# Patient Record
Sex: Male | Born: 1945 | Hispanic: No | Marital: Married | Smoking: Never smoker
Health system: Southern US, Community
[De-identification: ages and names within clinical notes are randomized; demographics above are authoritative.]

## PROBLEM LIST (undated history)

## (undated) DIAGNOSIS — M719 Bursopathy, unspecified: Secondary | ICD-10-CM

## (undated) DIAGNOSIS — M1990 Unspecified osteoarthritis, unspecified site: Secondary | ICD-10-CM

## (undated) DIAGNOSIS — I10 Essential (primary) hypertension: Secondary | ICD-10-CM

## (undated) DIAGNOSIS — K56609 Unspecified intestinal obstruction, unspecified as to partial versus complete obstruction: Secondary | ICD-10-CM

## (undated) DIAGNOSIS — M419 Scoliosis, unspecified: Secondary | ICD-10-CM

## (undated) DIAGNOSIS — K219 Gastro-esophageal reflux disease without esophagitis: Secondary | ICD-10-CM

## (undated) DIAGNOSIS — M549 Dorsalgia, unspecified: Secondary | ICD-10-CM

## (undated) DIAGNOSIS — E119 Type 2 diabetes mellitus without complications: Secondary | ICD-10-CM

## (undated) HISTORY — PX: NO PAST SURGERIES: SHX2092

---

## 2007-06-20 ENCOUNTER — Emergency Department (HOSPITAL_COMMUNITY): Admission: EM | Admit: 2007-06-20 | Discharge: 2007-06-20 | Payer: Self-pay | Admitting: Emergency Medicine

## 2007-10-26 ENCOUNTER — Ambulatory Visit: Payer: Self-pay | Admitting: Nurse Practitioner

## 2007-10-26 DIAGNOSIS — M4120 Other idiopathic scoliosis, site unspecified: Secondary | ICD-10-CM | POA: Insufficient documentation

## 2007-10-26 DIAGNOSIS — M719 Bursopathy, unspecified: Secondary | ICD-10-CM

## 2007-10-26 DIAGNOSIS — M67919 Unspecified disorder of synovium and tendon, unspecified shoulder: Secondary | ICD-10-CM | POA: Insufficient documentation

## 2007-10-31 ENCOUNTER — Ambulatory Visit (HOSPITAL_COMMUNITY): Admission: RE | Admit: 2007-10-31 | Discharge: 2007-10-31 | Payer: Self-pay | Admitting: Nurse Practitioner

## 2007-11-28 ENCOUNTER — Ambulatory Visit: Payer: Self-pay | Admitting: Nurse Practitioner

## 2007-11-28 DIAGNOSIS — IMO0002 Reserved for concepts with insufficient information to code with codable children: Secondary | ICD-10-CM

## 2007-11-28 LAB — CONVERTED CEMR LAB
AST: 22 units/L (ref 0–37)
Albumin: 4.1 g/dL (ref 3.5–5.2)
Alkaline Phosphatase: 90 units/L (ref 39–117)
BUN: 8 mg/dL (ref 6–23)
Basophils Absolute: 0 10*3/uL (ref 0.0–0.1)
Basophils Relative: 0 % (ref 0–1)
Calcium: 9.6 mg/dL (ref 8.4–10.5)
Creatinine, Ser: 0.78 mg/dL (ref 0.40–1.50)
Eosinophils Absolute: 0.3 10*3/uL (ref 0.0–0.7)
Eosinophils Relative: 4 % (ref 0–5)
HCT: 38 % — ABNORMAL LOW (ref 39.0–52.0)
Hemoglobin: 13.6 g/dL (ref 13.0–17.0)
Lymphocytes Relative: 42 % (ref 12–46)
Lymphs Abs: 2.5 10*3/uL (ref 0.7–4.0)
Platelets: 314 10*3/uL (ref 150–400)
TSH: 0.65 microintl units/mL (ref 0.350–5.50)

## 2008-05-08 ENCOUNTER — Ambulatory Visit: Payer: Self-pay | Admitting: Nurse Practitioner

## 2008-05-08 DIAGNOSIS — K219 Gastro-esophageal reflux disease without esophagitis: Secondary | ICD-10-CM

## 2008-05-08 DIAGNOSIS — S6990XA Unspecified injury of unspecified wrist, hand and finger(s), initial encounter: Secondary | ICD-10-CM

## 2008-10-01 ENCOUNTER — Ambulatory Visit: Payer: Self-pay | Admitting: Nurse Practitioner

## 2008-10-01 DIAGNOSIS — F17200 Nicotine dependence, unspecified, uncomplicated: Secondary | ICD-10-CM | POA: Insufficient documentation

## 2008-12-21 ENCOUNTER — Inpatient Hospital Stay (HOSPITAL_COMMUNITY): Admission: EM | Admit: 2008-12-21 | Discharge: 2008-12-23 | Payer: Self-pay | Admitting: Emergency Medicine

## 2009-01-02 ENCOUNTER — Encounter: Admission: RE | Admit: 2009-01-02 | Discharge: 2009-01-02 | Payer: Self-pay | Admitting: Pulmonary Disease

## 2009-06-09 ENCOUNTER — Emergency Department (HOSPITAL_COMMUNITY): Admission: EM | Admit: 2009-06-09 | Discharge: 2009-06-09 | Payer: Self-pay | Admitting: Emergency Medicine

## 2009-06-16 ENCOUNTER — Ambulatory Visit: Payer: Self-pay | Admitting: Nurse Practitioner

## 2010-05-31 ENCOUNTER — Emergency Department (HOSPITAL_COMMUNITY): Admission: EM | Admit: 2010-05-31 | Discharge: 2010-05-31 | Payer: Self-pay | Admitting: Family Medicine

## 2010-07-26 ENCOUNTER — Ambulatory Visit: Payer: Self-pay | Admitting: Nurse Practitioner

## 2010-07-26 DIAGNOSIS — M549 Dorsalgia, unspecified: Secondary | ICD-10-CM | POA: Insufficient documentation

## 2010-07-26 LAB — CONVERTED CEMR LAB
ALT: 21 units/L (ref 0–53)
AST: 32 units/L (ref 0–37)
Blood in Urine, dipstick: NEGATIVE
Calcium: 9.5 mg/dL (ref 8.4–10.5)
Glucose, Bld: 86 mg/dL (ref 70–99)
Ketones, urine, test strip: NEGATIVE
Nitrite: NEGATIVE
Specific Gravity, Urine: 1.02
WBC Urine, dipstick: NEGATIVE

## 2010-09-17 ENCOUNTER — Ambulatory Visit
Admission: RE | Admit: 2010-09-17 | Discharge: 2010-09-17 | Payer: Self-pay | Source: Home / Self Care | Attending: Nurse Practitioner | Admitting: Nurse Practitioner

## 2010-09-17 ENCOUNTER — Encounter (INDEPENDENT_AMBULATORY_CARE_PROVIDER_SITE_OTHER): Payer: Self-pay | Admitting: Nurse Practitioner

## 2010-09-28 ENCOUNTER — Telehealth (INDEPENDENT_AMBULATORY_CARE_PROVIDER_SITE_OTHER): Payer: Self-pay | Admitting: Nurse Practitioner

## 2010-10-07 ENCOUNTER — Encounter (INDEPENDENT_AMBULATORY_CARE_PROVIDER_SITE_OTHER): Payer: Self-pay | Admitting: Nurse Practitioner

## 2010-10-07 ENCOUNTER — Encounter (INDEPENDENT_AMBULATORY_CARE_PROVIDER_SITE_OTHER): Payer: Self-pay | Admitting: *Deleted

## 2010-10-07 ENCOUNTER — Ambulatory Visit
Admission: RE | Admit: 2010-10-07 | Discharge: 2010-10-07 | Payer: Self-pay | Source: Home / Self Care | Attending: Nurse Practitioner | Admitting: Nurse Practitioner

## 2010-10-07 ENCOUNTER — Telehealth (INDEPENDENT_AMBULATORY_CARE_PROVIDER_SITE_OTHER): Payer: Self-pay | Admitting: Nurse Practitioner

## 2010-10-07 DIAGNOSIS — R030 Elevated blood-pressure reading, without diagnosis of hypertension: Secondary | ICD-10-CM | POA: Insufficient documentation

## 2010-10-07 DIAGNOSIS — M25519 Pain in unspecified shoulder: Secondary | ICD-10-CM | POA: Insufficient documentation

## 2010-10-07 LAB — CONVERTED CEMR LAB
Bilirubin Urine: NEGATIVE
Blood in Urine, dipstick: NEGATIVE
Glucose, Urine, Semiquant: NEGATIVE
Protein, U semiquant: NEGATIVE
Urobilinogen, UA: 0.2
WBC Urine, dipstick: NEGATIVE

## 2010-10-08 ENCOUNTER — Encounter (INDEPENDENT_AMBULATORY_CARE_PROVIDER_SITE_OTHER): Payer: Self-pay | Admitting: Nurse Practitioner

## 2010-10-12 NOTE — Assessment & Plan Note (Signed)
Summary: Back pain   Vital Signs:  Patient profile:   65 year old male Height:      62. inches Weight:      150.00 pounds BMI:     27.53 Temp:     97.3 degrees F oral Pulse rate:   80 / minute Pulse rhythm:   regular Resp:     20 per minute BP sitting:   110 / 80  (left arm) Cuff size:   regular  Vitals Entered By: Levon Hedger (July 26, 2010 10:27 AM)  Nutrition Counseling: Patient's BMI is greater than 25 and therefore counseled on weight management options. CC: pain in back x 2-3 days, Back Pain Is Patient Diabetic? No Pain Assessment Patient in pain? yes     Location: back Intensity: 9  Does patient need assistance? Functional Status Self care Ambulation Normal   CC:  pain in back x 2-3 days and Back Pain.  History of Present Illness:  Pt into the office with c/o back pain. Started 2 days ago When bending or changes positions then the pain is worse sitting pain is ok denies any heavy lifing Pt is not employed no previous back pain        This is a 65 year old man who presents with Back Pain.  The symptoms began 3 days ago.  The intensity is described as an 8 out of 10.  The patient denies fever, weakness, urinary incontinence, and dysuria.  The pain is located in the right low back and left low back.  The pain began at home.  The pain is made worse by activity.  The pain is made better by inactivity.    Flu vaccine given at pt's school  Son present today to interpret for pt - Nepali  Habits & Providers  Alcohol-Tobacco-Diet     Alcohol drinks/day: 0     Tobacco Status: current     Tobacco Counseling: to remain off tobacco products     Cigarette Packs/Day: chewing tobacco  Exercise-Depression-Behavior     Does Patient Exercise: yes     Exercise Counseling: not indicated; exercise is adequate     Exercise (avg: min/session): <30     Times/week: 3     Drug Use: no     Sun Exposure: occasionally  Allergies (verified): No Known Drug  Allergies  Social History: Packs/Day:  chewing tobacco  Review of Systems CV:  Denies chest pain or discomfort. Resp:  Denies cough. GI:  Denies abdominal pain, nausea, and vomiting. MS:  Complains of low back pain.  Physical Exam  General:  alert.   Head:  normocephalic.   Mouth:  poor dentition.   Lungs:  normal breath sounds.   Heart:  normal rate and regular rhythm.   Msk:  normal ROM.   scoliosis - thoracic Neurologic:  alert & oriented X3.   Skin:  color normal.   Psych:  Oriented X3.    Immunization History:  Hepatitis B Immunization History:    Hepatitis B # 1:  historical (11/13/2006)  MMR Immunization History:    MMR # 1:  historical (11/13/2006)  Tetanus/Td Immunization History:    Tetanus/Td:  historical (02/26/2008)  Zostavax History:    Zostavax # 1:  zostavax (state) (02/26/2008)   Detailed Back/Spine Exam  Thoracic Exam:  Inspection-deformity:    Abnormal    scoliosis  Lumbosacral Exam:  Inspection-deformity:    Abnormal Palpation-spinal tenderness:  Abnormal    Location:  L4-L5 Sitting Straight Leg Raise:  Right:  negative    Left:  negative   Impression & Recommendations:  Problem # 1:  BACK PAIN (ICD-724.5) most likely strain although pt does have scoliosis Orders: T-Comprehensive Metabolic Panel (04540-98119) UA Dipstick w/o Micro (manual) (14782)  His updated medication list for this problem includes:    Ibuprofen 800 Mg Tabs (Ibuprofen) ..... One tablet by mouth two times a day as needed for pain  Problem # 2:  NEED PROPHYLACTIC VACCINATION&INOCULATION FLU (ICD-V04.81) given today in office  Problem # 3:  TOBACCO ABUSE (ICD-305.1) advised cessation  Complete Medication List: 1)  Ibuprofen 800 Mg Tabs (Ibuprofen) .... One tablet by mouth two times a day as needed for pain  Other Orders: Pneumococcal Vaccine (95621) Admin 1st Vaccine (30865)  Patient Instructions: 1)  Schedule an appointment for a complete physical  exam 2)  No food after midnight before this visit. 3)  will need fasting labs, EKG. u/a, rectal/prostate. 4)  Back pain - you have a history of scoliosis which is an abnormality in the middle part of your back.  This is nothing now. 5)  Low back pain may be due to strain on low back muscles. 6)  Take ibuprofen 800mg  by mouth two times a day as needed for pain 7)  You have received the pneumovax today. Prescriptions: IBUPROFEN 800 MG TABS (IBUPROFEN) One tablet by mouth two times a day as needed for pain  #50 x 0   Entered and Authorized by:   Lehman Prom FNP   Signed by:   Lehman Prom FNP on 07/26/2010   Method used:   Print then Give to Patient   RxID:   [id number]    Orders Added: 1)  Est. Patient Level III [40102] 2)  T-Comprehensive Metabolic Panel [72536-64403] 3)  UA Dipstick w/o Micro (manual) [81002] 4)  Pneumococcal Vaccine [90732] 5)  Admin 1st Vaccine [47425]   Immunization History:  Hepatitis B Immunization History:    Hepatitis B # 1:  historical (11/13/2006)  MMR Immunization History:    MMR # 1:  historical (11/13/2006)  Tetanus/Td Immunization History:    Tetanus/Td:  historical (02/26/2008)  Zostavax History:    Zostavax # 1:  zostavax (state) (02/26/2008)  Immunizations Administered:  Pneumonia Vaccine:    Vaccine Type: Pneumovax    Site: left deltoid    Mfr: Merck    Dose: 0.5 ml    Route: IM    Given by: [place] Shannon CMA    Exp. Date: 11/30/2011    Lot #: [id number]    VIS given: 08/17/09 version given July 26, 2010.   Immunization History:  Hepatitis B Immunization History:    Hepatitis B # 1:  Historical (11/13/2006)  MMR Immunization History:    MMR # 1:  Historical (11/13/2006)  Tetanus/Td Immunization History:    Tetanus/Td:  Historical (02/26/2008)  Zostavax History:    Zostavax # 1:  Zostavax (State) (02/26/2008)  Immunizations Administered:  Pneumonia Vaccine:    Vaccine Type: Pneumovax    Site: left  deltoid    Mfr: Merck    Dose: 0.5 ml    Route: IM    Given by: [place] Shannon CMA    Exp. Date: 11/30/2011    Lot #: [id number]    VIS given: 08/17/09 version given July 26, 2010.  Prevention & Chronic Care Immunizations   Influenza vaccine: Not documented   Influenza vaccine deferral: Not indicated  (07/26/2010)    Tetanus booster: 02/26/2008: Historical    Pneumococcal vaccine: Pneumovax  (  07/26/2010)    H. zoster vaccine: 02/26/2008: Zostavax (State)  Colorectal Screening   Hemoccult: Not documented    Colonoscopy: Not documented  Other Screening   PSA: Not documented   Smoking status: current  (07/26/2010)  Lipids   Total Cholesterol: Not documented   LDL: Not documented   LDL Direct: Not documented   HDL: Not documented   Triglycerides: Not documented   Laboratory Results   Urine Tests  Date/Time Received: July 26, 2010 11:29 AM   Routine Urinalysis   Color: lt. yellow Glucose: negative   (Normal Range: Negative) Bilirubin: negative   (Normal Range: Negative) Ketone: negative   (Normal Range: Negative) Spec. Gravity: 1.020   (Normal Range: 1.003-1.035) Blood: negative   (Normal Range: Negative) pH: 5.5   (Normal Range: 5.0-8.0) Protein: negative   (Normal Range: Negative) Urobilinogen: 0.2   (Normal Range: 0-1) Nitrite: negative   (Normal Range: Negative) Leukocyte Esterace: negative   (Normal Range: Negative)

## 2010-10-14 LAB — CONVERTED CEMR LAB
Basophils Absolute: 0 10*3/uL (ref 0.0–0.1)
Eosinophils Relative: 6 % — ABNORMAL HIGH (ref 0–5)
HCT: 38.9 % — ABNORMAL LOW (ref 39.0–52.0)
Hemoglobin: 13.8 g/dL (ref 13.0–17.0)
Monocytes Absolute: 0.3 10*3/uL (ref 0.1–1.0)
Monocytes Relative: 7 % (ref 3–12)
Neutro Abs: 1.9 10*3/uL (ref 1.7–7.7)
Neutrophils Relative %: 40 % — ABNORMAL LOW (ref 43–77)
Platelets: 247 10*3/uL (ref 150–400)
RDW: 14.9 % (ref 11.5–15.5)
Total CHOL/HDL Ratio: 4.6
Triglycerides: 105 mg/dL (ref <150)
WBC: 4.9 10*3/uL (ref 4.0–10.5)

## 2010-10-14 NOTE — Letter (Signed)
Summary: Pre Visit Letter Revised  [facility]  [address] [place], [place] [address]   Phone: [phone]  Fax: [phone]        10/07/2010 MRN: [id number] [facility] [address] [place], [place]  [address]             Procedure Date:  11-10-10   Welcome to the Gastroenterology Division at [facility].    You are scheduled to see a nurse for your pre-procedure visit on 10-27-10 at 10:30A.M. on the 3rd floor at [facility], [address]  We ask that you try to arrive at our office 15 minutes prior to your appointment time to allow for check-in.  Please take a minute to review the attached form.  If you answer "Yes" to one or more of the questions on the first page, we ask that you call the person listed at your earliest opportunity.  If you answer "No" to all of the questions, please complete the rest of the form and bring it to your appointment.    Your nurse visit will consist of discussing your medical and surgical history, your immediate family medical history, and your medications.   If you are unable to list all of your medications on the form, please bring the medication bottles to your appointment and we will list them.  We will need to be aware of both prescribed and over the counter drugs.  We will need to know exact dosage information as well.    Please be prepared to read and sign documents such as consent forms, a financial agreement, and acknowledgement forms.  If necessary, and with your consent, a friend or relative is welcome to sit-in on the nurse visit with you.  Please bring your insurance card so that we may make a copy of it.  If your insurance requires a referral to see a specialist, please bring your referral form from your primary care physician.  No co-pay is required for this nurse visit.     If you cannot keep your appointment, please call [phone] to cancel or reschedule prior to your appointment date.  This  allows [place] the opportunity to schedule an appointment for another patient in need of care.    Thank you for choosing  Gastroenterology for your medical needs.  We appreciate the opportunity to care for you.  Please visit [place] at our website  to learn more about our practice.  Sincerely, The Gastroenterology Division

## 2010-10-14 NOTE — Progress Notes (Signed)
Summary: Colonscopy  Phone Note Outgoing Call   Summary of Call: refer pt to GI for colonscopy (screening) never had one before no family history of colon cancer Call Son - Abil regarding the colonscopy appt (pt does not speak [place]) Jeanett Schlein (son) [phone] Initial call taken by: Lehman Prom FNP,  October 07, 2010 9:41 AM  Follow-up for Phone Call        PT HAS AN APPT NURSE VISIT 10-27-10 @ 10:30AM  PROCEDURE  11-10-10@ 8:30AM   [facility] SON   ABIL AWARE OF HIS APPT .Marland KitchenCheryll Dessert  October 07, 2010 10:24 AM

## 2010-10-14 NOTE — Assessment & Plan Note (Signed)
Summary: Chest pain  Nurse Visit   Vital Signs:  Patient profile:   65 year old male Temp:     96.8 degrees F Pulse rate:   72 / minute Pulse rhythm:   regular Resp:     16 per minute BP sitting:   96 / 68  (right arm) Cuff size:   regular  Vitals Entered By: Dutch Quint RN (September 17, 2010 11:14 AM)  CC:  c/o CP.  History of Present Illness: States CP began 5 days ago -- has never had before.  Denies dyspnea.   Review of Systems CV:  Complains of chest pain or discomfort; States pain is substernal/epigastric, nonradiating, burning.  Pain is constant, but intensity changes from slight to severe.  Pain is worse in the morning.Marland Kitchen GI:  Epigastric pain.  Stopped use of ibuprofen about a month ago, does use caffeine, eats spicy food.  Denies ETOH and tobacco use.Marland Kitchen    Physical Exam  General:  alert, well-developed, well-nourished, and well-hydrated.    CC: c/o CP Is Patient Diabetic? No Pain Assessment Patient in pain? yes     Location: chest Type: burning Onset of pain  5 days ago  Does patient need assistance? Functional Status Self care Ambulation Normal   Patient Instructions: 1)  Reviewed with Jesse Fall 2)  Your EKG is normal -- your chest pain is not related to your heart. 3)  Limit your intake of spicy foods and caffeine drinks, such as tea, especially while you are having this pain. 4)  Take Nexium 40 mg. one tab by mouth before breakfast every morning.   5)  Do not eat right before lying down.  Keep your head up a little in bed, try not to lie flat. 6)  Call if anything changes or if you have any questions.   Impression & Recommendations:  Problem # 1:  GERD (ICD-530.81)  Having CP - nonradiating EKG normal Advised re diet changes  to prevent reoccurrence To start Nexium F/U with provider  His updated medication list for this problem includes:    Nexium 40 Mg Cpdr (Esomeprazole magnesium) .Marland Kitchen... 1 tab by mouth before breakfast  daily  Orders: EKG w/ Interpretation (93000)  Complete Medication List: 1)  Ibuprofen 800 Mg Tabs (Ibuprofen) .... One tablet by mouth two times a day as needed for pain 2)  Nexium 40 Mg Cpdr (Esomeprazole magnesium) .Marland Kitchen.. 1 tab by mouth before breakfast daily   Allergies: No Known Drug Allergies  Orders Added: 1)  Est. Patient Level II [[address]] 2)  EKG w/ Interpretation [93000] Prescriptions: NEXIUM 40 MG CPDR (ESOMEPRAZOLE MAGNESIUM) 1 tab by mouth before breakfast daily  #30 x 3   Entered by:   Dutch Quint RN   Authorized by:   Lehman Prom FNP   Signed by:   Dutch Quint RN on 09/17/2010   Method used:   Print then Give to Patient   RxID:   [id number] NEXIUM 40 MG CPDR (ESOMEPRAZOLE MAGNESIUM) 1 tab by mouth before breakfast daily  #30 x 3   Entered by:   Dutch Quint RN   Authorized by:   Lehman Prom FNP   Signed by:   Dutch Quint RN on 09/17/2010   Method used:   Electronically to        [facility] [phone]* (retail)       [address]       [place], [place]  [address]       Ph:  [phone]       Fax: [phone]   RxID:   [id number]    EKG  Procedure date:  09/17/2010  Findings:      NSR

## 2010-10-14 NOTE — Letter (Signed)
Summary: Lipid Letter  [facility]  [address] [place], [place] [address]   Phone: [phone]  Fax: [phone]    10/08/2010  Francisco [name] [address] [place], [place]  [address]  Dear Francisco [name]:  We have carefully reviewed your last lipid profile from 10/07/2010 and the results are noted below with a summary of recommendations for lipid management.    Cholesterol:       179     Goal: less than 200   HDL "good" Cholesterol:   39     Goal: greater than 40   LDL "bad" Cholesterol:   119     Goal: less than 130   Triglycerides:       105     Goal: less than 150    Labs done during recent office visit are ok.  Your cholesterol labs are shown above.      Current Medications: 1)    Ibuprofen 800 Mg Tabs (Ibuprofen) .... One tablet by mouth two times a day as needed for pain 2)    Nexium 40 Mg Cpdr (Esomeprazole magnesium) .Marland Kitchen.. 1 tab by mouth before breakfast daily 3)    Patanol 0.1 % Soln (Olopatadine hcl) .... One drop in each eye two times a day  If you have any questions, please call. We appreciate being able to work with you.   Sincerely,    [facility] Francisco Prom FNP

## 2010-10-14 NOTE — Assessment & Plan Note (Signed)
Summary: Complete Physical Exam   Vital Signs:  Patient profile:   65 year old male Weight:      157.4 pounds Temp:     97.0 degrees F oral Pulse rate:   70 / minute Pulse rhythm:   regular Resp:     16 per minute BP sitting:   128 / 100  (left arm) Cuff size:   regular  Vitals Entered By: Levon Hedger (October 07, 2010 9:02 AM) CC: CPE...has been having eye irritation and jumping sensation., Abdominal Pain Is Patient Diabetic? No Pain Assessment Patient in pain? no       Does patient need assistance? Functional Status Self care Ambulation Normal   CC:  CPE...has been having eye irritation and jumping sensation. and Abdominal Pain.  History of Present Illness:  Pt into the office for a complete physical exam  Son present today to interpret  Optho - no recent eye exam.  He does complain of his eye jumping at times. Also he wants to scratch his eye as they are itchy from time to time.  Dental - no recent dental exam. No lower teeth.  Colonscopy - no history of colonscopy. No reports of constipation  no blood noted in stool  Dyspepsia History:      He has no alarm features of dyspepsia including no history of melena, hematochezia, dysphagia, persistent vomiting, or involuntary weight loss > 5%.  There is a prior history of GERD.  The patient does not have a prior history of documented ulcer disease.  The dominant symptom is not heartburn or acid reflux.  An H-2 blocker medication is not currently being taken.    Habits & Providers  Alcohol-Tobacco-Diet     Alcohol drinks/day: 0     Tobacco Status: current     Tobacco Counseling: to remain off tobacco products     Cigarette Packs/Day: chewing tobacco  Exercise-Depression-Behavior     Does Patient Exercise: yes     Exercise Counseling: not indicated; exercise is adequate     Exercise (avg: min/session): <30     Times/week: 3     Have you felt down or hopeless? no     Have you felt little pleasure in things?  no     Drug Use: no     Sun Exposure: occasionally  Current Medications (verified): 1)  Ibuprofen 800 Mg Tabs (Ibuprofen) .... One Tablet By Mouth Two Times A Day As Needed For Pain 2)  Nexium 40 Mg Cpdr (Esomeprazole Magnesium) .Marland Kitchen.. 1 Tab By Mouth Before Breakfast Daily  Allergies (verified): No Known Drug Allergies  Review of Systems General:  Denies fever. Eyes:  Complains of blurring. ENT:  Denies earache. CV:  Denies chest pain or discomfort. Resp:  Denies cough. GI:  Denies abdominal pain and indigestion; GERD symptoms improved and pt is no longer taking the nexium. GU:  Denies discharge, nocturia, and urinary frequency. MS:  Complains of joint pain; left shoulder pain at times. . Derm:  Denies dryness. Neuro:  Denies headaches. Psych:  Denies depression. Allergy:  Complains of itching eyes.  Physical Exam  General:  alert.   Head:  normocephalic.   Eyes:  pupils round.   Ears:  left TM - scarred TM right TM visible with no landmarks present Nose:  no nasal discharge.   Mouth:  no lower teeth upper teeth present with poor dentation Neck:  supple.   Chest Wall:  no masses.   Breasts:  no gynecomastia.   Lungs:  normal breath sounds.   Heart:  normal rate and regular rhythm.   Abdomen:  soft, non-tender, and normal bowel sounds.   Rectal:  no external abnormalities.   Genitalia:  uncircumcised and no scrotal masses.   Prostate:  no gland enlargement.   Pulses:  R radial normal and L radial normal.   Extremities:  no edema Neurologic:  alert & oriented X3.   Skin:  turgor normal.   Psych:  Oriented X3.     Shoulder/Elbow Exam  Shoulder Exam:    Left:    Inspection:  Normal    Palpation:  Abnormal       Location:  left AC joint    Stability:  stable    Tenderness:  no    Swelling:  no    Erythema:  no   Impression & Recommendations:  Problem # 1:  Preventive Health Care (ICD-V70.0) labs done rec optho and dental exam EKG done rectal and prostate  exam is normal  Problem # 2:  Screening PSA (ICD-V76.44) will check today  Problem # 3:  SCREENING, COLON CANCER (ICD-V76.51) will refer to GI Orders: Colonoscopy (Colon)  Problem # 4:  SPECIAL SCREENING MALIGNANT NEOPLASM OF PROSTATE (ICD-V76.44) prostate exam normal today will check PSA Orders: T-PSA (0011001100) Hemoccult Guaiac-1 spec.(in office) (82270)  Problem # 5:  TOBACCO ABUSE (ICD-305.1) advised cessation  Problem # 6:  BACK PAIN (ICD-724.5)  His updated medication list for this problem includes:    Ibuprofen 800 Mg Tabs (Ibuprofen) ..... One tablet by mouth two times a day as needed for pain  Orders: T-PSA (60109-32355) UA Dipstick w/o Micro (manual) (73220)  Problem # 7:  SHOULDER PAIN, LEFT (ICD-719.41) advised conservative management if still persists will do further workup His updated medication list for this problem includes:    Ibuprofen 800 Mg Tabs (Ibuprofen) ..... One tablet by mouth two times a day as needed for pain  Complete Medication List: 1)  Ibuprofen 800 Mg Tabs (Ibuprofen) .... One tablet by mouth two times a day as needed for pain 2)  Nexium 40 Mg Cpdr (Esomeprazole magnesium) .Marland Kitchen.. 1 tab by mouth before breakfast daily 3)  Patanol 0.1 % Soln (Olopatadine hcl) .... One drop in each eye two times a day  Other Orders: T-Lipid Profile (25427-06237) T-CBC w/Diff (62831-51761) T-TSH [phone]) EKG w/ Interpretation (93000)   Patient Instructions: 1)  Eyes - You should make an appointment with an eye doctor to get your eyes checked.  Start eye drops - one drop in each eye two times a day  2)  Colonscopy - You will be referred for the colonscopy screening test by the stomach doctors. You will be called with the date/time of the appointment 3)  Left shoulder - most likely due to arthritis.  Apply heat to the shoulder as needed for relief.  Try not to lay on this side at night while you are sleeping. 4)  Follow up as  needed Prescriptions: PATANOL 0.1 % SOLN (OLOPATADINE HCL) One drop in each eye two times a day  #56ml x 1   Entered and Authorized by:   Lehman Prom FNP   Signed by:   Lehman Prom FNP on 10/07/2010   Method used:   Print then Give to Patient   RxID:   [id number]    Orders Added: 1)  T-Lipid Profile [18299-37169] 2)  T-CBC w/Diff [67893-81017] 3)  T-PSA [51025-85277] 4)  T-TSH [82423-53614] 5)  Est. Patient age 61&> [[address]] 6)  UA  Dipstick w/o Micro (manual) [81002] 7)  Hemoccult Guaiac-1 spec.(in office) [82270] 8)  EKG w/ Interpretation [93000] 9)  Colonoscopy [Colon]    Laboratory Results   Urine Tests  Date/Time Received: October 07, 2010 9:16 AM   Routine Urinalysis   Color: lt. yellow Appearance: Clear Glucose: negative   (Normal Range: Negative) Bilirubin: negative   (Normal Range: Negative) Ketone: negative   (Normal Range: Negative) Spec. Gravity: <1.005   (Normal Range: 1.003-1.035) Blood: negative   (Normal Range: Negative) pH: 7.5   (Normal Range: 5.0-8.0) Protein: negative   (Normal Range: Negative) Urobilinogen: 0.2   (Normal Range: 0-1) Nitrite: negative   (Normal Range: Negative) Leukocyte Esterace: negative   (Normal Range: Negative)    Date/Time Received: October 07, 2010 10:01 AM   Stool - Occult Blood Hemmoccult #1: negative Date: 10/07/2010     EKG  Procedure date:  10/07/2010  Findings:      NSR   Laboratory Results   Urine Tests    Routine Urinalysis   Color: lt. yellow Appearance: Clear Glucose: negative   (Normal Range: Negative) Bilirubin: negative   (Normal Range: Negative) Ketone: negative   (Normal Range: Negative) Spec. Gravity: <1.005   (Normal Range: 1.003-1.035) Blood: negative   (Normal Range: Negative) pH: 7.5   (Normal Range: 5.0-8.0) Protein: negative   (Normal Range: Negative) Urobilinogen: 0.2   (Normal Range: 0-1) Nitrite: negative   (Normal Range: Negative) Leukocyte Esterace:  negative   (Normal Range: Negative)      Stool - Occult Blood Hemmoccult #1: negative    Prevention & Chronic Care Immunizations   Influenza vaccine: historical per pt  (07/13/2010)   Influenza vaccine deferral: Not indicated  (07/26/2010)    Tetanus booster: 02/26/2008: Historical    Pneumococcal vaccine: Pneumovax  (07/26/2010)    H. zoster vaccine: 02/26/2008: Zostavax ([place])  Colorectal Screening   Hemoccult: Not documented    Colonoscopy: Not documented  Other Screening   PSA: Not documented   PSA ordered.   Smoking status: current  (10/07/2010)   Smoking cessation counseling: yes  (10/07/2010)  Lipids   Total Cholesterol: Not documented   LDL: Not documented   LDL Direct: Not documented   HDL: Not documented   Triglycerides: Not documented

## 2010-10-14 NOTE — Progress Notes (Signed)
Summary: Requesting ibuprofen  Phone Note Outgoing Call   Summary of Call: Pt. is still requesting ibuprofen.  Provider refuses refills due to GERD symptoms and advised use of tylenol instead.  Daughter-in-law advised during last phone call, call made to pt. to remind of provider's last response.   Left message with male for DIL or son to return call when they come home from work.   Initial call taken by: Dutch Quint RN,  September 28, 2010 12:39 PM  Follow-up for Phone Call        Spoke with DIL - states pt. is still taking Nexium.  Advised for pt. to take tylenol for pain, not ibuprofen because it upsets his stomach and informed that we will not refill his ibuprofen for that reason.  Verbalized understanding and agreement.  Dutch Quint RN  September 28, 2010 5:55 PM

## 2010-10-25 ENCOUNTER — Encounter (INDEPENDENT_AMBULATORY_CARE_PROVIDER_SITE_OTHER): Payer: Self-pay | Admitting: *Deleted

## 2010-10-27 ENCOUNTER — Encounter: Payer: Self-pay | Admitting: Gastroenterology

## 2010-11-03 NOTE — Letter (Signed)
Summary: [facility] Instructions  [facility]  [address] [place], [place] [address]   Phone: [phone]  Fax: [phone]       Francisco [name]    [birth year]    MRN: [id number]        Procedure Day Dorna Bloom:  [facility]  11/10/10     Arrival Time:  8:30AM     Procedure Time:  9:30AM     Location of Procedure:                    [name]  [facility] (4th Floor)                      PREPARATION FOR COLONOSCOPY WITH MOVIPREP   Starting 5 days prior to your procedure 11/05/10 do not eat nuts, seeds, popcorn, corn, beans, peas,  salads, or any raw vegetables.  Do not take any fiber supplements (e.g. Metamucil, Citrucel, and Benefiber).  THE DAY BEFORE YOUR PROCEDURE         DATE: 11/09/10   DAY: TUESDAY  1.  Drink clear liquids the entire day-NO SOLID FOOD  2.  Do not drink anything colored red or purple.  Avoid juices with pulp.  No orange juice.  3.  Drink at least 64 oz. (8 glasses) of fluid/clear liquids during the day to prevent dehydration and help the prep work efficiently.  CLEAR LIQUIDS INCLUDE: Water Jello Ice Popsicles Tea (sugar ok, no milk/cream) Powdered fruit flavored drinks Coffee (sugar ok, no milk/cream) Gatorade Juice: apple, white grape, white cranberry  Lemonade Clear bullion, consomm, broth Carbonated beverages (any kind) Strained chicken noodle soup Hard Candy                             4.  In the morning, mix first dose of MoviPrep solution:    Empty 1 Pouch A and 1 Pouch B into the disposable container    Add lukewarm drinking water to the top line of the container. Mix to dissolve    Refrigerate (mixed solution should be used within 24 hrs)  5.  Begin drinking the prep at 5:00 p.m. The MoviPrep container is divided by 4 marks.   Every 15 minutes drink the solution down to the next mark (approximately 8 oz) until the full liter is complete.   6.  Follow completed prep with 16 oz of clear liquid of your choice (Nothing  red or purple).  Continue to drink clear liquids until bedtime.  7.  Before going to bed, mix second dose of MoviPrep solution:    Empty 1 Pouch A and 1 Pouch B into the disposable container    Add lukewarm drinking water to the top line of the container. Mix to dissolve    Refrigerate  THE DAY OF YOUR PROCEDURE      DATE: 11/10/10   DAY:  WEDNESDAY  Beginning at 4:30AM (5 hours before procedure):         1. Every 15 minutes, drink the solution down to the next mark (approx 8 oz) until the full liter is complete.  2. Follow completed prep with 16 oz. of clear liquid of your choice.    3. You may drink clear liquids until 7:30AM (2 HOURS BEFORE PROCEDURE).   MEDICATION INSTRUCTIONS  Unless otherwise instructed, you should take regular prescription medications with a small sip of water   as early as possible the  morning of your procedure.         OTHER INSTRUCTIONS  You will need a responsible adult at least 66 years of age to accompany you and drive you home.   This person must remain in the waiting room during your procedure.  Wear loose fitting clothing that is easily removed.  Leave jewelry and other valuables at home.  However, you may wish to bring a book to read or  an iPod/MP3 player to listen to music as you wait for your procedure to start.  Remove all body piercing jewelry and leave at home.  Total time from sign-in until discharge is approximately 2-3 hours.  You should go home directly after your procedure and rest.  You can resume normal activities the  day after your procedure.  The day of your procedure you should not:   Drive   Make legal decisions   Operate machinery   Drink alcohol   Return to work  You will receive specific instructions about eating, activities and medications before you leave.    The above instructions have been reviewed and explained to me by   Francisco Almas RN  October 27, 2010 10:42 AM     I fully understand  and can verbalize these instructions _____________________________ Date _________

## 2010-11-03 NOTE — Miscellaneous (Signed)
Summary: [facility] Previsit/prep  Clinical Lists Changes  Medications: Added new medication of MOVIPREP 100 GM  SOLR (PEG-KCL-NACL-NASULF-NA ASC-C) As per prep instructions. - Signed Rx of MOVIPREP 100 GM  SOLR (PEG-KCL-NACL-NASULF-NA ASC-C) As per prep instructions.;  #1 x 0;  Signed;  Entered by: Wyona Almas RN;  Authorized by: Louis Meckel MD;  Method used: Electronically to [facility] [phone]*, [address] [place], [place]  [address], Ph: [phone], Fax: [phone] Observations: Added new observation of NKA: T (10/27/2010 10:00)    Prescriptions: MOVIPREP 100 GM  SOLR (PEG-KCL-NACL-NASULF-NA ASC-C) As per prep instructions.  #1 x 0   Entered by:   Wyona Almas RN   Authorized by:   Louis Meckel MD   Signed by:   Wyona Almas RN on 10/27/2010   Method used:   Electronically to        [facility] [phone]* (retail)       [address]       [place], [place]  [address]       Ph: [phone]       Fax: [phone]   RxID:   [id number]

## 2010-11-10 ENCOUNTER — Other Ambulatory Visit (AMBULATORY_SURGERY_CENTER): Payer: Medicaid Other | Admitting: Gastroenterology

## 2010-11-10 ENCOUNTER — Encounter: Payer: Self-pay | Admitting: Gastroenterology

## 2010-11-10 DIAGNOSIS — Z1211 Encounter for screening for malignant neoplasm of colon: Secondary | ICD-10-CM

## 2010-11-18 NOTE — Procedures (Signed)
Summary: Colonoscopy  Patient: Francisco [name] Note: All result statuses are Final unless otherwise noted.  Tests: (1) Colonoscopy (COL)   COL Colonoscopy           DONE     [facility]     [address]     [place], [place]  [address]           COLONOSCOPY PROCEDURE REPORT           PATIENT:  Francisco [name], Francisco [name]  MR#:  [id number]     BIRTHDATE:  11-04-1945, 64 yrs. old  GENDER:  male           ENDOSCOPIST:  Barbette Hair. Arlyce Dice, MD     Referred by:  Lehman Prom, FNP           PROCEDURE DATE:  11/10/2010     PROCEDURE:  Diagnostic Colonoscopy     ASA CLASS:  Class I     INDICATIONS:  1) Routine Risk Screening           MEDICATIONS:   Fentanyl 50 mcg IV, Versed 5 mg IV           DESCRIPTION OF PROCEDURE:   After the risks benefits and     alternatives of the procedure were thoroughly explained, informed     consent was obtained.  Digital rectal exam was performed and     revealed no abnormalities.   The [id number] [id number] endoscope was     introduced through the anus and advanced to the cecum, which was     identified by both the appendix and ileocecal valve, without     limitations.  The quality of the prep was excellent, using     MoviPrep.  The instrument was then slowly withdrawn as the colon     was fully examined.     <<PROCEDUREIMAGES>>           FINDINGS:  A normal appearing cecum, ileocecal valve, and     appendiceal orifice were identified. The ascending, hepatic     flexure, transverse, splenic flexure, descending, sigmoid colon,     and rectum appeared unremarkable (see image3, image4, image6,     image8, image10, image12, image17, and image19).   Retroflexed     views in the rectum revealed no abnormalities.    The time to     cecum =  4.25  minutes. The scope was then withdrawn (time =  6.0     min) from the patient and the procedure completed.           COMPLICATIONS:  None           ENDOSCOPIC IMPRESSION:     1) Normal colon     RECOMMENDATIONS:     1)  Continue current colorectal screening recommendations for     "routine risk" patients with a repeat colonoscopy in 10 years.           REPEAT EXAM:   10 year(s) Colonoscopy           ______________________________     Barbette Hair. Arlyce Dice, MD           CC:           n.     eSIGNED:   Barbette Hair. Yalena Colon at 11/10/2010 10:17 AM           Alwyn Pea, [id number]  Note: An exclamation mark (!) indicates a result that was not dispersed into the flowsheet. Document Creation  Date: 11/10/2010 10:18 AM _______________________________________________________________________  (1) Order result status: Final Collection or observation date-time: 11/10/2010 10:12 Requested date-time:  Receipt date-time:  Reported date-time:  Referring Physician:   Ordering Physician: Melvia Heaps [phone]) Specimen Source:  Source: Launa Grill Order Number: [id number] Lab site:   Appended Document: Colonoscopy    Clinical Lists Changes  Observations: Added new observation of COLONNXTDUE: 10/2020 (11/10/2010 14:18)

## 2010-12-22 LAB — BASIC METABOLIC PANEL
CO2: 28 mEq/L (ref 19–32)
Chloride: 108 mEq/L (ref 96–112)
GFR calc Af Amer: >60 mL/min (ref >60)
GFR calc non Af Amer: >60 mL/min (ref >60)
Glucose, Bld: 139 mg/dL — ABNORMAL HIGH (ref 70–99)
Potassium: 4.2 mEq/L (ref 3.5–5.1)
Sodium: 141 mEq/L (ref 135–145)

## 2010-12-22 LAB — CBC
HCT: 45.8 % (ref 39.0–52.0)
MCHC: 33.5 g/dL (ref 30.0–36.0)
MCHC: 33.8 g/dL (ref 30.0–36.0)
Platelets: 199 10*3/uL (ref 150–400)
Platelets: 233 10*3/uL (ref 150–400)
RBC: 5.55 MIL/uL (ref 4.22–5.81)

## 2010-12-22 LAB — DIFFERENTIAL
Basophils Absolute: 0 10*3/uL (ref 0.0–0.1)
Basophils Relative: 0 % (ref 0–1)
Lymphs Abs: 1.7 10*3/uL (ref 0.7–4.0)
Monocytes Absolute: 0.7 10*3/uL (ref 0.1–1.0)
Monocytes Relative: 4 % (ref 3–12)
Neutro Abs: 13.7 10*3/uL — ABNORMAL HIGH (ref 1.7–7.7)

## 2010-12-22 LAB — COMPREHENSIVE METABOLIC PANEL
AST: 34 U/L (ref 0–37)
Alkaline Phosphatase: 82 U/L (ref 39–117)
BUN: 11 mg/dL (ref 6–23)
CO2: 30 mEq/L (ref 19–32)
Calcium: 9.8 mg/dL (ref 8.4–10.5)
Chloride: 98 mEq/L (ref 96–112)
GFR calc Af Amer: >60 mL/min (ref >60)
Potassium: 4 mEq/L (ref 3.5–5.1)
Total Protein: 8.1 g/dL (ref 6.0–8.3)

## 2010-12-22 LAB — URINALYSIS, ROUTINE W REFLEX MICROSCOPIC
Ketones, ur: 15 mg/dL — AB
Nitrite: NEGATIVE
Protein, ur: NEGATIVE mg/dL
Urobilinogen, UA: 0.2 mg/dL (ref 0.0–1.0)

## 2010-12-22 LAB — POCT CARDIAC MARKERS
Myoglobin, poc: 138 ng/mL (ref 12–200)
Troponin i, poc: <0.05 ng/mL (ref 0.00–0.09)

## 2010-12-22 LAB — PROTIME-INR: INR: 1.1 (ref 0.00–1.49)

## 2011-01-25 NOTE — H&P (Signed)
Francisco [name], [name]                ACCOUNT NO.:  [id number]   MEDICAL RECORD NO.:  [id number]          PATIENT TYPE:  INP   LOCATION:  5118                         FACILITY:  [facility]   PHYSICIAN:  Sandria Bales. Ezzard Standing, M.D.  DATE OF BIRTH:  03-25-1946   DATE OF ADMISSION:  12/21/2008  DATE OF DISCHARGE:                              HISTORY & PHYSICAL   Date of consultation ?   HISTORY OF ILLNESS: This is a 65 year old male who is from the country  of [place], [place], who presented to the  hospital with a history of abdominal pain, nausea, and vomiting.   He apparently had an episode before where he was seen in the emergency  room, maybe in October 2008 for diagnosis of gastritis where he came in  with nausea and vomiting, I do not think he was admitted in the hospital  at that time.   Anyway, he had over the last 24 hours apparently several loose stools  followed by diffuse abdominal pain with multiple episodes of emesis,  came to the emergency room by his son.  Unfortunately, the patient  speaks no [place], is depending on his son as a Nurse, learning disability.  Apparently,  there is an another son and a daughter who live in this area also.   Mr. Francisco [name] has no history of peptic ulcer disease, liver disease, colon  disease.  He has had no prior abdominal surgery.   PAST MEDICAL HISTORY:  He has had no history of seizure or loss of  consciousness.   REVIEW OF SYSTEMS:  NEUROLOGIC: No history of seizure or stroke.  PULMONARY:  He does not smokes cigarettes.  CARDIAC:  He has no heart disease, chest pain, or hypertension.  GASTROINTESTINAL:  See history of present illness.  UROLOGIC:  No kidney stones or kidney infections.   He moved here to the [place] from [place] about 1-1/2 year ago and  speaks no [place].  His son, who does speak English, was in the room  during the exam.   PHYSICAL EXAMINATION:  VITAL SIGNS:  His blood pressure is 110/71, pulse  86,  respirations are 18, temperature 98.9.  GENERAL:  He is a well-nourished Asian male who is alert, cooperative,  and slowly understands translating through his son.  HEENT:  Unremarkable.  He does have some staining of his teeth and I am  not sure what that is from.  NECK:  Supple.  I feel no mass or thyromegaly.  LUNGS:  Symmetric breath sounds with no rub.  HEART:  Regular rate and rhythm without murmur or rub.  ABDOMEN:  Actually on my exam, it was almost totally benign with active  bowel sounds.  He is soft.  He has no tenderness, no guarding, no mass.  He has no abdominal scar, no hernia, no mass.  EXTREMITIES:  He has good strength in the upper and lower extremities.  NEUROLOGIC:  Grossly intact.   LABORATORY DATA:  Sodium 137, potassium 4.0, chloride of 98, CO2 of 30,  glucose of 182, BUN of 11, creatinine of 0.7.  His white blood count  16,400, hemoglobin 15, hematocrit 45, platelet count 233,000.  Urinalysis was negative.  His lipase is 54.   I reviewed his CT scan, which does show some dilated small bowel.  Its  hard to tell if there is a transition point, the radiologist raised a  question of transition point immediate to the left of the umbilicus.  I  am not convinced of this.  He does have a little bit of fecalization  of small bowel contents, which could be consistent with a partial  chronic small bowel obstruction, but there is no other mass, lesion, or  nodule seen on CT.   IMPRESSION:  1. Small bowel obstruction versus ileus though actually right now he      clinically has a pretty benign abdomen.  I doubt he has a      mechanical small bowel obstruction.   PLAN:  Put him on IV fluids, admitted overnight.  We will leave the NG  tube out for now, but if he has continued nausea and vomiting, we would  place an NG tube.  To repeat flat and upright films and labs tomorrow  morning.      Sandria Bales. Ezzard Standing, M.D.  Electronically Signed     DHN/MEDQ  D:  12/21/2008   T:  12/22/2008  Job:  [id number]   cc:   Melvern Banker

## 2011-06-23 LAB — DIFFERENTIAL
Basophils Absolute: 0
Lymphocytes Relative: 34
Monocytes Absolute: 0.4
Monocytes Relative: 7
Neutro Abs: 3.1

## 2011-06-23 LAB — POCT CARDIAC MARKERS
CKMB, poc: 5.7
Troponin i, poc: <0.05

## 2011-06-23 LAB — HEPATIC FUNCTION PANEL
ALT: 17
AST: 26
Albumin: 3.6
Alkaline Phosphatase: 80
Total Bilirubin: 0.7

## 2011-06-23 LAB — I-STAT 8, (EC8 V) (CONVERTED LAB)
BUN: 12
Bicarbonate: 28.1 — ABNORMAL HIGH
HCT: 43
Operator id: 294521
pCO2, Ven: 45.8
pH, Ven: 7.395 — ABNORMAL HIGH

## 2011-06-23 LAB — CBC
Hemoglobin: 12.6 — ABNORMAL LOW
RBC: 4.77
RDW: 15.2 — ABNORMAL HIGH

## 2012-02-28 ENCOUNTER — Emergency Department (HOSPITAL_COMMUNITY): Payer: Medicaid Other

## 2012-02-28 ENCOUNTER — Encounter (HOSPITAL_COMMUNITY): Payer: Self-pay | Admitting: Emergency Medicine

## 2012-02-28 DIAGNOSIS — M129 Arthropathy, unspecified: Secondary | ICD-10-CM | POA: Insufficient documentation

## 2012-02-28 DIAGNOSIS — R42 Dizziness and giddiness: Secondary | ICD-10-CM | POA: Insufficient documentation

## 2012-02-28 DIAGNOSIS — R079 Chest pain, unspecified: Secondary | ICD-10-CM | POA: Insufficient documentation

## 2012-02-28 LAB — DIFFERENTIAL
Basophils Absolute: 0 10*3/uL (ref 0.0–0.1)
Basophils Relative: 1 % (ref 0–1)
Eosinophils Relative: 5 % (ref 0–5)
Monocytes Absolute: 0.5 10*3/uL (ref 0.1–1.0)

## 2012-02-28 LAB — CBC
HCT: 39.1 % (ref 39.0–52.0)
MCH: 27.7 pg (ref 26.0–34.0)
MCHC: 34 g/dL (ref 30.0–36.0)
MCV: 81.5 fL (ref 78.0–100.0)
RDW: 14.1 % (ref 11.5–15.5)

## 2012-02-28 LAB — BASIC METABOLIC PANEL
BUN: 10 mg/dL (ref 6–23)
Calcium: 9.9 mg/dL (ref 8.4–10.5)
Creatinine, Ser: 0.86 mg/dL (ref 0.50–1.35)
GFR calc Af Amer: >90 mL/min (ref >90)

## 2012-02-28 LAB — POCT I-STAT TROPONIN I: Troponin i, poc: 0 ng/mL (ref 0.00–0.08)

## 2012-02-28 NOTE — ED Notes (Signed)
Patient with dizziness and chest pain that started 2 days ago.  No nausea, no shortness of breath

## 2012-02-29 ENCOUNTER — Emergency Department (HOSPITAL_COMMUNITY)
Admission: EM | Admit: 2012-02-29 | Discharge: 2012-02-29 | Disposition: A | Discharge status: Home / Self Care | Payer: Medicaid Other | Attending: Emergency Medicine | Admitting: Emergency Medicine

## 2012-02-29 DIAGNOSIS — M1990 Unspecified osteoarthritis, unspecified site: Secondary | ICD-10-CM

## 2012-02-29 MED ORDER — KETOROLAC TROMETHAMINE 60 MG/2ML IM SOLN
60.0000 mg | Freq: Once | INTRAMUSCULAR | Status: AC
  Administered 2012-02-29: 60 mg via INTRAMUSCULAR
  Filled 2012-02-29: qty 2

## 2012-02-29 MED ORDER — NAPROXEN 500 MG PO TABS: 500.0000 mg | ORAL_TABLET | Freq: Two times a day (BID) | ORAL | Status: DC

## 2012-02-29 NOTE — ED Notes (Signed)
Awaiting son's return for review of d/c instructions

## 2012-02-29 NOTE — ED Provider Notes (Signed)
History     CSN: [id number]  Arrival date & time 02/28/12  2008   First MD Initiated Contact with Patient 02/29/12 0103      Chief Complaint  Patient presents with  . Chest Pain  . Dizziness    (Consider location/radiation/quality/duration/timing/severity/associated sxs/prior treatment) HPI Comments: 66 year old male from the country of [place] who presents with the complaint of bilateral shoulder, elbow and knee pain which started approximately 7 days ago. This is not associated with swelling rashes fevers. He denies chest pain difficulty breathing, neck pain or headaches. He does admit to having mild dizziness which is poorly described but states that sometimes he feels like the room is spinning when he moves his head. He denies ringing in ears, loss of hearing, sore throat, sinus pressure, visual changes. 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  He does not have any other medical history, takes no medications, again has no chest pain shortness of breath. He states that the pain in her shoulders and knees gets worse when he range of motion this joint.  Patient is a 66 y.o. male presenting with chest pain. The history is provided by the patient. The history is limited by a language barrier. A language interpreter was used.  Chest Pain     History reviewed. No pertinent past medical history.  History reviewed. No pertinent past surgical history.  No family history on file.  History  Substance Use Topics  . Smoking status: Never Smoker   . Smokeless tobacco: Not on file  . Alcohol Use:       Review of Systems  Cardiovascular: Positive for chest pain.  All other systems reviewed and are negative.    Allergies  Review of patient's allergies indicates no known allergies.  Home Medications   Current Outpatient Rx  Name Route Sig Dispense Refill  . NAPROXEN 500 MG PO TABS Oral Take 1 tablet (500 mg total) by mouth 2 (two) times daily  with a meal. 30 tablet 0    BP 141/100  Pulse 60  Temp 98.2 F (36.8 C) (Oral)  Resp 19  SpO2 100%  Physical Exam  Nursing note and vitals reviewed. Constitutional: He appears well-developed and well-nourished. No distress.  HENT:  Head: Normocephalic and atraumatic.  Mouth/Throat: Oropharynx is clear and moist. No oropharyngeal exudate.       Tympanic membranes clear bilaterally  Eyes: Conjunctivae and EOM are normal. Pupils are equal, round, and reactive to light. Right eye exhibits no discharge. Left eye exhibits no discharge. No scleral icterus.  Neck: Normal range of motion. Neck supple. No JVD present. No thyromegaly present.  Cardiovascular: Normal rate, regular rhythm, normal heart sounds and intact distal pulses.  Exam reveals no gallop and no friction rub.   No murmur heard.      Sounds are normal, brisk capillary refill, normal radial artery pulses bilaterally, normal pedal pulses bilaterally  Pulmonary/Chest: Effort normal and breath sounds normal. No respiratory distress. He has no wheezes. He has no rales.       Lungs are clear, no difficulty breathing, no increased work of breathing, normal respiratory rate  Abdominal: Soft. Bowel sounds are normal. He exhibits no distension and no mass. There is no tenderness.  Musculoskeletal: Normal range of motion. He exhibits no edema and no tenderness.       Normal range of motion of all joints including shoulders elbows wrists knees and hips. The joints are supple, there is no  swelling or effusions, no erythema or rashes overlying the joints  Lymphadenopathy:    He has no cervical adenopathy.  Neurological: He is alert. Coordination normal.       Follows commands, has normal strength and sensation in his bilateral upper and lower extremities  Skin: Skin is warm and dry. No rash noted. No erythema.  Psychiatric: He has a normal mood and affect. His behavior is normal.    ED Course  Procedures (including critical care  time)   ED ECG REPORT   Date: 02/29/2012 I personally interpreted the EKG  Rate: 63  Rhythm: normal sinus rhythm  QRS Axis: normal  Intervals: normal  ST/T Wave abnormalities: normal  Conduction Disutrbances:none  Narrative Interpretation:   Old EKG Reviewed: Compared with 06/09/2009, no significant changes   Labs Reviewed  DIFFERENTIAL - Abnormal; Notable for the following:    Neutrophils Relative 41 (*)     All other components within normal limits  BASIC METABOLIC PANEL - Abnormal; Notable for the following:    Glucose, Bld 109 (*)     GFR calc non Af Amer 88 (*)     All other components within normal limits  CBC  POCT I-STAT TROPONIN I   Dg Chest 2 View  02/28/2012  *RADIOLOGY REPORT*  Clinical Data: Chest pain.  Dizziness.  CHEST - 2 VIEW  Comparison: Two-view chest x-ray 01/02/2009 [facility], 07/20/2007 [facility], 06/20/2007 [facility].  Findings: Cardiac silhouette normal in size, unchanged.  Thoracic aorta tortuous, unchanged.  Hilar and mediastinal contours otherwise unremarkable.  Linear scarring in the right middle lobe, unchanged.  Mild hyperinflation, unchanged.  No new pulmonary parenchymal abnormalities.  No pleural effusions.  Degenerative changes involving the thoracic spine with chronic mild compression fractures of what I believe are T11 and T12 with mild kyphous deformity.  IMPRESSION: No acute cardiopulmonary disease.  Scarring in the right middle lobe.  Hyperinflation consistent with COPD and/or asthma.  Original Report Authenticated By: Arnell Sieving, M.D.     1. Arthritis       MDM  Overall the patient appears to have a normal physical exam, his musculoskeletal exam and joints appear normal and supple. He does have pain with range of motion but it is minimal. His EKG is totally normal with no signs of ischemia and he does not complain of any chest pain. Anti-inflammatory started, results reviewed showing normal metabolic  panel without any elect light abnormalities, renal dysfunction or any signs of anemia. Troponin negative, chest x-ray without any obvious acute findings we does have some scarring in the right middle lobe. We'll prescribe NSAID, refer to family doctors in the community for ongoing workup of arthritis.  Discharge Prescriptions include:  Naprosyn         Vida Roller, MD 02/29/12 [phone]

## 2012-02-29 NOTE — ED Notes (Signed)
Patient here with chest pain, denies any shortness of breath.  No nausea or vomiting.  Patient is CAOx3, speaks [place].  Patient states he is not having any chest pain at this time.

## 2012-02-29 NOTE — Discharge Instructions (Signed)
You need to followup with your family Dr. for further testing about arthritis. If your symptoms get worse including chest pain, swelling of your knees, elbows or shoulders or worsening symptoms of any kind, return to the hospital immediately for a repeat evaluation. Take Naprosyn twice a day as prescribed.

## 2012-08-17 ENCOUNTER — Emergency Department (HOSPITAL_COMMUNITY): Payer: Medicaid Other

## 2012-08-17 ENCOUNTER — Encounter (HOSPITAL_COMMUNITY): Payer: Self-pay

## 2012-08-17 ENCOUNTER — Inpatient Hospital Stay (HOSPITAL_COMMUNITY)
Admission: EM | Admit: 2012-08-17 | Discharge: 2012-08-19 | DRG: 391 | Disposition: A | Discharge status: Home / Self Care | Payer: Medicaid Other | Attending: General Surgery | Admitting: General Surgery

## 2012-08-17 DIAGNOSIS — K56609 Unspecified intestinal obstruction, unspecified as to partial versus complete obstruction: Secondary | ICD-10-CM

## 2012-08-17 DIAGNOSIS — K219 Gastro-esophageal reflux disease without esophagitis: Secondary | ICD-10-CM | POA: Diagnosis present

## 2012-08-17 DIAGNOSIS — R197 Diarrhea, unspecified: Secondary | ICD-10-CM | POA: Diagnosis present

## 2012-08-17 DIAGNOSIS — K859 Acute pancreatitis without necrosis or infection, unspecified: Secondary | ICD-10-CM | POA: Diagnosis present

## 2012-08-17 DIAGNOSIS — R112 Nausea with vomiting, unspecified: Secondary | ICD-10-CM | POA: Diagnosis present

## 2012-08-17 DIAGNOSIS — M4120 Other idiopathic scoliosis, site unspecified: Secondary | ICD-10-CM | POA: Diagnosis present

## 2012-08-17 DIAGNOSIS — F17200 Nicotine dependence, unspecified, uncomplicated: Secondary | ICD-10-CM | POA: Diagnosis present

## 2012-08-17 DIAGNOSIS — K5289 Other specified noninfective gastroenteritis and colitis: Principal | ICD-10-CM | POA: Diagnosis present

## 2012-08-17 DIAGNOSIS — IMO0002 Reserved for concepts with insufficient information to code with codable children: Secondary | ICD-10-CM | POA: Diagnosis present

## 2012-08-17 HISTORY — DX: Scoliosis, unspecified: M41.9

## 2012-08-17 HISTORY — DX: Unspecified intestinal obstruction, unspecified as to partial versus complete obstruction: K56.609

## 2012-08-17 HISTORY — DX: Dorsalgia, unspecified: M54.9

## 2012-08-17 HISTORY — DX: Unspecified osteoarthritis, unspecified site: M19.90

## 2012-08-17 HISTORY — DX: Bursopathy, unspecified: M71.9

## 2012-08-17 HISTORY — DX: Gastro-esophageal reflux disease without esophagitis: K21.9

## 2012-08-17 HISTORY — DX: Essential (primary) hypertension: I10

## 2012-08-17 LAB — COMPREHENSIVE METABOLIC PANEL
ALT: 14 U/L (ref 0–53)
AST: 24 U/L (ref 0–37)
CO2: 24 mEq/L (ref 19–32)
Chloride: 98 mEq/L (ref 96–112)
Creatinine, Ser: 0.82 mg/dL (ref 0.50–1.35)
GFR calc non Af Amer: 89 mL/min — ABNORMAL LOW (ref >90)
Sodium: 137 mEq/L (ref 135–145)
Total Bilirubin: 0.5 mg/dL (ref 0.3–1.2)

## 2012-08-17 LAB — CBC WITH DIFFERENTIAL/PLATELET
Basophils Absolute: 0 10*3/uL (ref 0.0–0.1)
HCT: 45.2 % (ref 39.0–52.0)
Lymphocytes Relative: 13 % (ref 12–46)
Lymphs Abs: 2.2 10*3/uL (ref 0.7–4.0)
Monocytes Absolute: 0.9 10*3/uL (ref 0.1–1.0)
Neutro Abs: 13.5 10*3/uL — ABNORMAL HIGH (ref 1.7–7.7)
RDW: 13.7 % (ref 11.5–15.5)

## 2012-08-17 MED ORDER — SODIUM CHLORIDE 0.9 % IV SOLN
Freq: Once | INTRAVENOUS | Status: AC
  Administered 2012-08-17: 06:00:00 via INTRAVENOUS

## 2012-08-17 MED ORDER — MORPHINE SULFATE 2 MG/ML IJ SOLN
1.0000 mg | INTRAMUSCULAR | Status: DC | PRN
  Administered 2012-08-18: 2 mg via INTRAVENOUS
  Filled 2012-08-17: qty 1

## 2012-08-17 MED ORDER — ONDANSETRON HCL 4 MG/2ML IJ SOLN
4.0000 mg | Freq: Four times a day (QID) | INTRAMUSCULAR | Status: DC | PRN
  Administered 2012-08-18: 4 mg via INTRAVENOUS
  Filled 2012-08-17: qty 2

## 2012-08-17 MED ORDER — ONDANSETRON HCL 4 MG/2ML IJ SOLN
4.0000 mg | Freq: Once | INTRAMUSCULAR | Status: AC
  Administered 2012-08-17: 4 mg via INTRAVENOUS

## 2012-08-17 MED ORDER — IOHEXOL 300 MG/ML  SOLN
20.0000 mL | INTRAMUSCULAR | Status: DC
  Administered 2012-08-17: 20 mL via ORAL

## 2012-08-17 MED ORDER — KCL IN DEXTROSE-NACL 20-5-0.45 MEQ/L-%-% IV SOLN
INTRAVENOUS | Status: DC
  Administered 2012-08-17 – 2012-08-19 (×6): via INTRAVENOUS
  Filled 2012-08-17 (×8): qty 1000

## 2012-08-17 MED ORDER — MORPHINE SULFATE 4 MG/ML IJ SOLN
4.0000 mg | Freq: Once | INTRAMUSCULAR | Status: AC
  Administered 2012-08-17: 4 mg via INTRAVENOUS
  Filled 2012-08-17: qty 1

## 2012-08-17 MED ORDER — DIPHENHYDRAMINE HCL 50 MG/ML IJ SOLN: 12.5000 mg | Freq: Four times a day (QID) | INTRAMUSCULAR | Status: DC | PRN

## 2012-08-17 MED ORDER — DIPHENHYDRAMINE HCL 12.5 MG/5ML PO ELIX
12.5000 mg | ORAL_SOLUTION | Freq: Four times a day (QID) | ORAL | Status: DC | PRN
  Filled 2012-08-17: qty 5

## 2012-08-17 MED ORDER — SODIUM CHLORIDE 0.9 % IV BOLUS (SEPSIS): 1000.0000 mL | Freq: Once | INTRAVENOUS | Status: DC

## 2012-08-17 MED ORDER — PANTOPRAZOLE SODIUM 40 MG IV SOLR
40.0000 mg | Freq: Every day | INTRAVENOUS | Status: DC
  Administered 2012-08-17 – 2012-08-18 (×2): 40 mg via INTRAVENOUS
  Filled 2012-08-17 (×3): qty 40

## 2012-08-17 MED ORDER — IOHEXOL 300 MG/ML  SOLN
80.0000 mL | Freq: Once | INTRAMUSCULAR | Status: AC | PRN
  Administered 2012-08-17: 80 mL via INTRAVENOUS

## 2012-08-17 MED ORDER — ONDANSETRON HCL 4 MG/2ML IJ SOLN
INTRAMUSCULAR | Status: AC
  Filled 2012-08-17: qty 2

## 2012-08-17 NOTE — ED Provider Notes (Signed)
Medical screening examination/treatment/procedure(s) were performed by non-physician practitioner and as supervising physician I was immediately available for consultation/collaboration.  Jones Skene, M.D.     Jones Skene, MD 08/17/12 1737

## 2012-08-17 NOTE — Progress Notes (Signed)
UR completed 

## 2012-08-17 NOTE — H&P (Addendum)
Francisco [name] is an 66 y.o. male.   Chief Complaint: n/v/d/abd pain HPI:  Pt is a 66 yo M from [place] who presents with <24 hours of N/V/D and severe abdominal pain.  It came on all of a sudden.  History is obtained via daughter.  He had diarrhea all night.  It is difficult to get great description of pain via interpreter.  He denies fever/ chills.  He denies sick contacts.  He denies prior history of this problem, yet was admitted in 2010 by Dr. Ezzard Standing for similar symptoms.  It is difficult to assess flatus.    History reviewed.  See above. Patient Active Problem List  Diagnosis  . TOBACCO ABUSE  . GERD  . DEGENERATIVE DISC DISEASE, THORACIC SPINE  . BACK PAIN  . BURSITIS, RIGHT SHOULDER  . SCOLIOSIS, THORACOLUMBAR  . HAND INJURY  . SHOULDER PAIN, LEFT  . ELEVATED BLOOD PRESSURE WITHOUT DIAGNOSIS OF HYPERTENSION     History reviewed.  No PSH  No family history on file. Social History:  reports that he has never smoked. His smokeless tobacco use includes Chew. He reports that he does not drink alcohol or use illicit drugs.  Allergies: No Known Allergies   Meds: None  Results for orders placed during the hospital encounter of 08/17/12 (from the past 48 hour(s))  CBC WITH DIFFERENTIAL     Status: Abnormal   Collection Time   08/17/12  5:03 AM      Component Value Range Comment   WBC 16.7 (*) 4.0 - 10.5 K/uL    RBC 5.56  4.22 - 5.81 MIL/uL    Hemoglobin 15.7  13.0 - 17.0 g/dL    HCT 32.4  40.1 - 02.7 %    MCV 81.3  78.0 - 100.0 fL    MCH 28.2  26.0 - 34.0 pg    MCHC 34.7  30.0 - 36.0 g/dL    RDW 25.3  66.4 - 40.3 %    Platelets 256  150 - 400 K/uL    Neutrophils Relative 81 (*) 43 - 77 %    Neutro Abs 13.5 (*) 1.7 - 7.7 K/uL    Lymphocytes Relative 13  12 - 46 %    Lymphs Abs 2.2  0.7 - 4.0 K/uL    Monocytes Relative 5  3 - 12 %    Monocytes Absolute 0.9  0.1 - 1.0 K/uL    Eosinophils Relative 1  0 - 5 %    Eosinophils Absolute 0.2  0.0 - 0.7 K/uL    Basophils  Relative 0  0 - 1 %    Basophils Absolute 0.0  0.0 - 0.1 K/uL   COMPREHENSIVE METABOLIC PANEL     Status: Abnormal   Collection Time   08/17/12  5:03 AM      Component Value Range Comment   Sodium 137  135 - 145 mEq/L    Potassium 3.3 (*) 3.5 - 5.1 mEq/L    Chloride 98  96 - 112 mEq/L    CO2 24  19 - 32 mEq/L    Glucose, Bld 203 (*) 70 - 99 mg/dL    BUN 10  6 - 23 mg/dL    Creatinine, Ser 4.74  0.50 - 1.35 mg/dL    Calcium 25.9  8.4 - 10.5 mg/dL    Total Protein 8.2  6.0 - 8.3 g/dL    Albumin 4.3  3.5 - 5.2 g/dL    AST 24  0 - 37 U/L  ALT 14  0 - 53 U/L    Alkaline Phosphatase 95  39 - 117 U/L    Total Bilirubin 0.5  0.3 - 1.2 mg/dL    GFR calc non Af Amer 89 (*) >90 mL/min    GFR calc Af Amer >90  >90 mL/min   LIPASE, BLOOD     Status: Abnormal   Collection Time   08/17/12  5:03 AM      Component Value Range Comment   Lipase 68 (*) 11 - 59 U/L    Ct Abdomen Pelvis W Contrast  08/17/2012  *RADIOLOGY REPORT*  Clinical Data: Nausea, vomiting and diarrhea.  Diffuse abdominal pain with difficulty urinating.  CT ABDOMEN AND PELVIS WITH CONTRAST  Technique:  Multidetector CT imaging of the abdomen and pelvis was performed following the standard protocol during bolus administration of intravenous contrast.  Contrast: 80mL OMNIPAQUE IOHEXOL 300 MG/ML  SOLN  Comparison: 12/21/2008.  Findings: High-grade small bowel obstruction with abrupt change of caliber right upper quadrant at the level of the proximal ileum. Cause indeterminate.  Adhesion not excluded.  No definitive mass seen at this level.  The patient has a 2 cm gallstone.  Within the dilated small bowel loops, radiopaque material is noted however this may represent ingested pill rather than gallstones such as may be seen with gallstone ileus.  Small amount of free fluid most notable right pericolic gutter/inferior aspect of the liver and anterior left lower abdomen/pelvis.  No free intraperitoneal air.  Basilar scarring/subsegmental  atelectatic changes.  Left upper pole 1.7 cm renal cyst.  Left lower pole sub centimeter low density structure cannot be confirmed as a simple cyst on the present exam.  Tiny low density structure right upper pole may be a cyst although too small to adequately characterize.  Findings are unchanged.  No worrisome liver, splenic, pancreatic or adrenal lesion.  Slight prominence of the pancreatic duct unchanged.  Ectatic aorta without focal aneurysm.  Fatty containing inguinal hernia on the right.  Noncontrast filled imaging of the urinary bladder unremarkable.  Kyphosis T11-12 level.  No bony destructive lesion.  IMPRESSION: High-grade small bowel obstruction with abrupt change of caliber right upper quadrant at the level of the proximal ileum.  Cause indeterminate.  2 cm gallstone.  Please see above.  Critical Value/emergent results were called by telephone at the time of interpretation on 08/17/2012 at 9:22 a.m. to [facility] physician's assistant,, who verbally acknowledged these results.   Original Report Authenticated By: Lacy Duverney, M.D.    Dg Chest Port 1 View  08/17/2012  *RADIOLOGY REPORT*  Clinical Data: Severe mid abdominal pain, vomiting, diarrhea and difficulty with urination.  PORTABLE CHEST - 1 VIEW  Comparison: Chest radiograph performed 02/28/2012  Findings: The lungs are hypoexpanded.  Mild bibasilar airspace opacities may reflect atelectasis or possibly pneumonia.  No pleural effusion or pneumothorax is seen.  The cardiomediastinal silhouette is borderline normal in size.  No acute osseous abnormalities are seen.  IMPRESSION: Lungs hypoexpanded; mild bibasilar airspace opacities may reflect atelectasis or possibly pneumonia.   Original Report Authenticated By: Tonia Ghent, M.D.     Review of Systems  Constitutional: Positive for malaise/fatigue. Negative for fever and chills.  HENT: Negative.   Eyes: Negative.   Respiratory: Negative.   Cardiovascular: Negative.    Gastrointestinal: Positive for nausea, vomiting, abdominal pain and diarrhea.  Genitourinary: Negative.   Musculoskeletal: Negative.   Skin: Negative.   Neurological: Negative.   Endo/Heme/Allergies: Negative.   Psychiatric/Behavioral: Negative.  Blood pressure 151/81, pulse 90, temperature 97.2 F (36.2 C), temperature source Oral, resp. rate 20, SpO2 97.00%. Physical Exam  Constitutional: He is oriented to person, place, and time. He appears well-developed and well-nourished. No distress.  HENT:  Head: Normocephalic and atraumatic.  Eyes: Conjunctivae normal are normal. Pupils are equal, round, and reactive to light. No scleral icterus.  Neck: Normal range of motion. Neck supple. No tracheal deviation present. No thyromegaly present.  Cardiovascular: Normal rate, regular rhythm and intact distal pulses.  Exam reveals no gallop and no friction rub.   No murmur heard. Respiratory: Effort normal and breath sounds normal. No respiratory distress. He has no wheezes. He has no rales. He exhibits no tenderness.  GI: Soft. He exhibits distension (mild). He exhibits no mass. There is no tenderness. There is no rebound and no guarding.       Hypoactive bowel sounds.    Musculoskeletal: Normal range of motion. He exhibits no edema and no tenderness.  Lymphadenopathy:    He has no cervical adenopathy.  Neurological: He is alert and oriented to person, place, and time. Coordination normal.  Skin: Skin is warm and dry. No rash noted. He is not diaphoretic. No erythema. No pallor.  Psychiatric: He has a normal mood and affect. His behavior is normal. Judgment and thought content normal.     Assessment/Plan Unclear if pt truly has pSBO or gastroenteritis or complications of gallstones.  He has mild acute pancreatitis based on lab values.    NGT/NPO IVF   Sharifah Champine 08/17/2012, 10:55 AM

## 2012-08-17 NOTE — ED Provider Notes (Signed)
Francisco [name] is a 66 y.o. male transferred to CDU from Pod B. Signout from Dr. Norlene Campbell  as follows: Patient with epigastric pain nausea/vomiting, diarrhea. Patient is bilious emesis he is from upon speaks little English patient is tolerating by mouth drinking his contrast pending CT. UA also pending. Leukocytosis of 16.7.   0905 Pt seen and examined at the bedside he says his pain is much improved and emesis is also improved. This is translated by his granddaughter. Lung sounds are clear to auscultation bilaterally, Heart is regular rate and rhythm with no murmurs rubs or gallops, abdominal exam is diffusely tender with no peritoneal signs.  Critical result from radiologist Dr. Constance Goltz phoned at 0920 high-grade bowel obstruction of unknown etiology possible adhesions. Denies prior abdominal surgery. Patient n.p.o. an NG tube inserted. 500 mils of bilious liquid returned.  TCS consult from [place] appreciated she will come to evaluate the patient. Patient will be admitted to their service.  Results for orders placed during the hospital encounter of 08/17/12  CBC WITH DIFFERENTIAL      Component Value Range   WBC 16.7 (*) 4.0 - 10.5 K/uL   RBC 5.56  4.22 - 5.81 MIL/uL   Hemoglobin 15.7  13.0 - 17.0 g/dL   HCT 40.9  81.1 - 91.4 %   MCV 81.3  78.0 - 100.0 fL   MCH 28.2  26.0 - 34.0 pg   MCHC 34.7  30.0 - 36.0 g/dL   RDW 78.2  95.6 - 21.3 %   Platelets 256  150 - 400 K/uL   Neutrophils Relative 81 (*) 43 - 77 %   Neutro Abs 13.5 (*) 1.7 - 7.7 K/uL   Lymphocytes Relative 13  12 - 46 %   Lymphs Abs 2.2  0.7 - 4.0 K/uL   Monocytes Relative 5  3 - 12 %   Monocytes Absolute 0.9  0.1 - 1.0 K/uL   Eosinophils Relative 1  0 - 5 %   Eosinophils Absolute 0.2  0.0 - 0.7 K/uL   Basophils Relative 0  0 - 1 %   Basophils Absolute 0.0  0.0 - 0.1 K/uL  COMPREHENSIVE METABOLIC PANEL      Component Value Range   Sodium 137  135 - 145 mEq/L   Potassium 3.3 (*) 3.5 - 5.1 mEq/L   Chloride 98  96 - 112 mEq/L   CO2  24  19 - 32 mEq/L   Glucose, Bld 203 (*) 70 - 99 mg/dL   BUN 10  6 - 23 mg/dL   Creatinine, Ser 0.86  0.50 - 1.35 mg/dL   Calcium 57.8  8.4 - 46.9 mg/dL   Total Protein 8.2  6.0 - 8.3 g/dL   Albumin 4.3  3.5 - 5.2 g/dL   AST 24  0 - 37 U/L   ALT 14  0 - 53 U/L   Alkaline Phosphatase 95  39 - 117 U/L   Total Bilirubin 0.5  0.3 - 1.2 mg/dL   GFR calc non Af Amer 89 (*) >90 mL/min   GFR calc Af Amer >90  >90 mL/min  LIPASE, BLOOD      Component Value Range   Lipase 68 (*) 11 - 59 U/L   Dg Chest Port 1 View  08/17/2012  *RADIOLOGY REPORT*  Clinical Data: Severe mid abdominal pain, vomiting, diarrhea and difficulty with urination.  PORTABLE CHEST - 1 VIEW  Comparison: Chest radiograph performed 02/28/2012  Findings: The lungs are hypoexpanded.  Mild bibasilar airspace opacities  may reflect atelectasis or possibly pneumonia.  No pleural effusion or pneumothorax is seen.  The cardiomediastinal silhouette is borderline normal in size.  No acute osseous abnormalities are seen.  IMPRESSION: Lungs hypoexpanded; mild bibasilar airspace opacities may reflect atelectasis or possibly pneumonia.   Original Report Authenticated By: Tonia Ghent, M.D.      Wynetta Emery, PA-C 08/17/12 1225

## 2012-08-17 NOTE — ED Provider Notes (Addendum)
History     CSN: [id number]  Arrival date & time 08/17/12  0431   First MD Initiated Contact with Patient 08/17/12 0459      Chief Complaint  Patient presents with  . Abdominal Pain    (Consider location/radiation/quality/duration/timing/severity/associated sxs/prior treatment) HPI 66 year old male presents to emergency room complaining of nausea vomiting diarrhea and diffuse abdominal pain since around midnight. Patient complains of some difficulty with urination. Translation is done through his son. Patient does not speak any [place]. No sick contacts, no travel or unusual foods. No fever. Patient complains that emesis is bitter tasting, not sweet.  No prior surgeries. No previous medical problems. Patient does not take medications. Pain is described as a burning sensation mainly in the upper part of the abdomen. No prior history of same  History reviewed. No pertinent past medical history.  History reviewed. No pertinent past surgical history.  No family history on file.  History  Substance Use Topics  . Smoking status: Never Smoker   . Smokeless tobacco: Current User    Types: Chew  . Alcohol Use: No      Review of Systems  Unable to perform ROS: Other   language barrier  Allergies  Review of patient's allergies indicates no known allergies.  Home Medications  No current outpatient prescriptions on file.  BP 146/103  Pulse 103  Temp 97.2 F (36.2 C) (Oral)  Resp 24  SpO2 96%  Physical Exam  Nursing note and vitals reviewed. Constitutional: He appears distressed.       Patient actively vomiting yellow bile. Appears older than stated age  HENT:  Head: Normocephalic and atraumatic.  Neck: Normal range of motion. Neck supple. No JVD present. No tracheal deviation present. No thyromegaly present.  Cardiovascular: Regular rhythm and intact distal pulses.  Exam reveals no gallop and no friction rub.   No murmur heard.      Tachycardia noted  Pulmonary/Chest:  No stridor.  Abdominal: Soft. He exhibits no distension and no mass. There is tenderness (diffuse tenderness throughout). There is no rebound and no guarding.       Hyperactive bowel sounds  Musculoskeletal: Normal range of motion. He exhibits no edema and no tenderness.  Lymphadenopathy:    He has no cervical adenopathy.  Neurological: He is alert. He exhibits normal muscle tone. Coordination normal.  Skin: Skin is warm and dry. No rash noted. No erythema. No pallor.    ED Course  Procedures (including critical care time)  Labs Reviewed  CBC WITH DIFFERENTIAL - Abnormal; Notable for the following:    WBC 16.7 (*)     Neutrophils Relative 81 (*)     Neutro Abs 13.5 (*)     All other components within normal limits  COMPREHENSIVE METABOLIC PANEL - Abnormal; Notable for the following:    Potassium 3.3 (*)     Glucose, Bld 203 (*)     GFR calc non Af Amer 89 (*)     All other components within normal limits  LIPASE, BLOOD - Abnormal; Notable for the following:    Lipase 68 (*)     All other components within normal limits  URINALYSIS, ROUTINE W REFLEX MICROSCOPIC   Dg Chest Port 1 View  08/17/2012  *RADIOLOGY REPORT*  Clinical Data: Severe mid abdominal pain, vomiting, diarrhea and difficulty with urination.  PORTABLE CHEST - 1 VIEW  Comparison: Chest radiograph performed 02/28/2012  Findings: The lungs are hypoexpanded.  Mild bibasilar airspace opacities may reflect atelectasis or possibly  pneumonia.  No pleural effusion or pneumothorax is seen.  The cardiomediastinal silhouette is borderline normal in size.  No acute osseous abnormalities are seen.  IMPRESSION: Lungs hypoexpanded; mild bibasilar airspace opacities may reflect atelectasis or possibly pneumonia.   Original Report Authenticated By: Tonia Ghent, M.D.      No diagnosis found.    MDM  66 year old male with acute onset of nausea vomiting diarrhea this morning. No significant past medical history, no history of same.  Patient looks ill with actively vomiting. We'll check labs give IV fluids, Zofran pain medication. We'll get CT of abdomen pelvis.   Care passed to Dr Rulon Abide awaiting CT abd/pelvis.  Pain and n/v controlled.     Olivia Mackie, MD 08/17/12 [phone]  Prior records reviewed, has had h/o GERD, was on nexium in the past.  Olivia Mackie, MD 08/17/12 [phone]

## 2012-08-17 NOTE — ED Notes (Signed)
Pt. Feeling much better.    Pt. Reports that his abdominal pain is 1/10

## 2012-08-17 NOTE — ED Notes (Signed)
Patient c/o an acute onset of severe mid abdominal pain, vomiting, diarrhea and difficulty with urination x 4 hours. Denies CP, SOB, headache or dizziness. Denies any blood in stool or emesis. Patient does NOT speak english; son translating.

## 2012-08-17 NOTE — ED Notes (Signed)
Pt given contrast to drink.

## 2012-08-18 ENCOUNTER — Inpatient Hospital Stay (HOSPITAL_COMMUNITY): Payer: Medicaid Other

## 2012-08-18 LAB — CBC
HCT: 39.5 % (ref 39.0–52.0)
Hemoglobin: 13.2 g/dL (ref 13.0–17.0)
MCH: 27.5 pg (ref 26.0–34.0)
MCHC: 33.4 g/dL (ref 30.0–36.0)
MCV: 82.3 fL (ref 78.0–100.0)
RBC: 4.8 MIL/uL (ref 4.22–5.81)

## 2012-08-18 LAB — URINALYSIS, ROUTINE W REFLEX MICROSCOPIC
Glucose, UA: NEGATIVE mg/dL
Hgb urine dipstick: NEGATIVE
Ketones, ur: NEGATIVE mg/dL
Protein, ur: NEGATIVE mg/dL

## 2012-08-18 LAB — BASIC METABOLIC PANEL
BUN: 10 mg/dL (ref 6–23)
Glucose, Bld: 155 mg/dL — ABNORMAL HIGH (ref 70–99)

## 2012-08-18 MED ORDER — ACETAMINOPHEN 325 MG PO TABS
650.0000 mg | ORAL_TABLET | Freq: Four times a day (QID) | ORAL | Status: DC | PRN
  Administered 2012-08-18: 650 mg via ORAL

## 2012-08-18 NOTE — Progress Notes (Signed)
Subjective: Doing well patient states that he is passing Flatus, no abdominal pain, NO N/V/D (Son served as Nurse, learning disability)  Objective: Vital signs in last 24 hours: Temp:  [98.7 F (37.1 C)-100 F (37.8 C)] 98.8 F (37.1 C) (12/07 0525) Pulse Rate:  [74-114] 74  (12/07 0525) Resp:  [15-18] 16  (12/07 0525) BP: (97-134)/(58-95) 97/58 mmHg (12/07 0525) SpO2:  [93 %-96 %] 96 % (12/07 0525) Weight:  [157 lb 6.4 oz (71.396 kg)] 157 lb 6.4 oz (71.396 kg) (12/07 0902) Last BM Date: 08/16/12  Intake/Output from previous day: 12/06 0701 - 12/07 0700 In: 2019 [I.V.:2019] Out: 1425 [Urine:425; Emesis/NG output:1000] Intake/Output this shift: Total I/O In: -  Out: 300 [Urine:300]  General appearance: alert, cooperative, appears stated age and no distress Chest: CTA Cardiac: RRR Abdomen: soft, non tender, + flatus  Extremities: warm to touch, + pulses, no edema or tenderness. VSS, afebrile WBC improved.  Lab Results:   [facility] 08/18/12 0625 08/17/12 0503  WBC 10.4 16.7*  HGB 13.2 15.7  HCT 39.5 45.2  PLT 214 256   BMET  Basename 08/18/12 0625 08/17/12 0503  NA 137 137  K 3.8 3.3*  CL 102 98  CO2 25 24  GLUCOSE 155* 203*  BUN 10 10  CREATININE 1.02 0.82  CALCIUM 8.6 10.1   PT/INR No results found for this basename: LABPROT:2,INR:2 in the last 72 hours ABG No results found for this basename: PHART:2,PCO2:2,PO2:2,HCO3:2 in the last 72 hours  Studies/Results: Ct Abdomen Pelvis W Contrast  08/17/2012  *RADIOLOGY REPORT*  Clinical Data: Nausea, vomiting and diarrhea.  Diffuse abdominal pain with difficulty urinating.  CT ABDOMEN AND PELVIS WITH CONTRAST  Technique:  Multidetector CT imaging of the abdomen and pelvis was performed following the standard protocol during bolus administration of intravenous contrast.  Contrast: 80mL OMNIPAQUE IOHEXOL 300 MG/ML  SOLN  Comparison: 12/21/2008.  Findings: High-grade small bowel obstruction with abrupt change of caliber right upper  quadrant at the level of the proximal ileum. Cause indeterminate.  Adhesion not excluded.  No definitive mass seen at this level.  The patient has a 2 cm gallstone.  Within the dilated small bowel loops, radiopaque material is noted however this may represent ingested pill rather than gallstones such as may be seen with gallstone ileus.  Small amount of free fluid most notable right pericolic gutter/inferior aspect of the liver and anterior left lower abdomen/pelvis.  No free intraperitoneal air.  Basilar scarring/subsegmental atelectatic changes.  Left upper pole 1.7 cm renal cyst.  Left lower pole sub centimeter low density structure cannot be confirmed as a simple cyst on the present exam.  Tiny low density structure right upper pole may be a cyst although too small to adequately characterize.  Findings are unchanged.  No worrisome liver, splenic, pancreatic or adrenal lesion.  Slight prominence of the pancreatic duct unchanged.  Ectatic aorta without focal aneurysm.  Fatty containing inguinal hernia on the right.  Noncontrast filled imaging of the urinary bladder unremarkable.  Kyphosis T11-12 level.  No bony destructive lesion.  IMPRESSION: High-grade small bowel obstruction with abrupt change of caliber right upper quadrant at the level of the proximal ileum.  Cause indeterminate.  2 cm gallstone.  Please see above.  Critical Value/emergent results were called by telephone at the time of interpretation on 08/17/2012 at 9:22 a.m. to [facility] physician's assistant,, who verbally acknowledged these results.   Original Report Authenticated By: Lacy Duverney, M.D.    Dg Chest Port 1 View  08/17/2012  *  RADIOLOGY REPORT*  Clinical Data: Severe mid abdominal pain, vomiting, diarrhea and difficulty with urination.  PORTABLE CHEST - 1 VIEW  Comparison: Chest radiograph performed 02/28/2012  Findings: The lungs are hypoexpanded.  Mild bibasilar airspace opacities may reflect atelectasis or possibly pneumonia.   No pleural effusion or pneumothorax is seen.  The cardiomediastinal silhouette is borderline normal in size.  No acute osseous abnormalities are seen.  IMPRESSION: Lungs hypoexpanded; mild bibasilar airspace opacities may reflect atelectasis or possibly pneumonia.   Original Report Authenticated By: Tonia Ghent, M.D.    Dg Abd 2 Views  08/18/2012  *RADIOLOGY REPORT*  Clinical Data: Small bowel obstruction  ABDOMEN - 2 VIEW  Comparison: 08/17/2012  Findings: There is nonspecific nonobstructive bowel gas pattern. Contrast material from recent CT scan noted in the right colon and transverse colon.  NG tube with tip in proximal stomach.  IMPRESSION: Nonspecific nonobstructive bowel gas pattern.  Contrast material from recent CT scan noted in the right colon and transverse colon. NG tube with tip in proximal stomach.   Original Report Authenticated By: Natasha Mead, M.D.     Anti-infectives: Anti-infectives    None      Assessment/Plan: s/p * No surgery found *  Patient Active Problem List  Diagnosis  . TOBACCO ABUSE  . GERD  . DEGENERATIVE DISC DISEASE, THORACIC SPINE  . BACK PAIN  . BURSITIS, RIGHT SHOULDER  . SCOLIOSIS, THORACOLUMBAR  . HAND INJURY  . SHOULDER PAIN, LEFT  . ELEVATED BLOOD PRESSURE WITHOUT DIAGNOSIS OF HYPERTENSION  1. SBO (resolved) 2. DC NG advance to clears 3. Probable discharge in am if continues to do well    [facility] Pager # [phone]  08/18/2012

## 2012-08-18 NOTE — Progress Notes (Signed)
Xrays show contrast in colon.  No n/v.  + flatus. Probable gastroenteritis. Advance diet. ? Home in AM.

## 2012-08-19 NOTE — Progress Notes (Signed)
Discharge instructions gone over with patient and son present.  No home medications to go over. To follow up with [facility] if worsening symptoms occur or as needed. No prescriptions at this time. Education gone over and educational sheet provided about gastroenteritis (viral). Patient and son verbalized understanding of instructions and home care.

## 2012-08-19 NOTE — Discharge Summary (Signed)
Physician Discharge Summary  Patient ID: Francisco [name] MRN: [id number] DOB/AGE: 1946-01-17 66 y.o.  Admit date: 08/17/2012 Discharge date: 08/19/2012  Admission Diagnoses:  Discharge Diagnoses:   gastroenteritis Active Problems:  * No active hospital problems. *    Discharged Condition: good  Hospital Course: unremarkable.  Improved with bowel rest and iv rehydration  Consults: None  Significant Diagnostic Studies:  Treatments: IV hydration  Discharge Exam: Blood pressure 143/82, pulse 93, temperature 97.4 F (36.3 C), temperature source Oral, resp. rate 19, height 5\' 3"  (1.6 m), weight 157 lb 6.4 oz (71.396 kg), SpO2 96.00%. General appearance: alert and no distress GI: soft, non-tender; bowel sounds normal; no masses,  no organomegaly  Disposition: 01-Home or Self Care     Medication List     As of 08/19/2012 10:02 AM         Follow-up Information    Follow up with [facility] (As needed)    Contact information:   [address] [place] [address] [phone]         Signed: Shelly Rubenstein 08/19/2012, 10:02 AM

## 2012-08-19 NOTE — Progress Notes (Signed)
  Subjective: Doing well Tolerating po No pain  Objective: Vital signs in last 24 hours: Temp:  [97.4 F (36.3 C)-98.6 F (37 C)] 97.4 F (36.3 C) (12/08 0618) Pulse Rate:  [67-93] 93  (12/08 0618) Resp:  [18-20] 19  (12/08 0618) BP: (121-143)/(69-82) 143/82 mmHg (12/08 0618) SpO2:  [96 %-100 %] 96 % (12/08 0618) Last BM Date: 09-03-12  Intake/Output from previous day: 09-04-23 0701 - 12/08 0700 In: 2026.7 [P.O.:960; I.V.:1066.7] Out: 750 [Urine:600; Emesis/NG output:150] Intake/Output this shift:    Abdomen benign, nontender on exam  Lab Results:   [facility] September 03, 2012 0625 08/17/12 0503  WBC 10.4 16.7*  HGB 13.2 15.7  HCT 39.5 45.2  PLT 214 256   BMET  Basename September 03, 2012 0625 08/17/12 0503  NA 137 137  K 3.8 3.3*  CL 102 98  CO2 25 24  GLUCOSE 155* 203*  BUN 10 10  CREATININE 1.02 0.82  CALCIUM 8.6 10.1   PT/INR No results found for this basename: LABPROT:2,INR:2 in the last 72 hours ABG No results found for this basename: PHART:2,PCO2:2,PO2:2,HCO3:2 in the last 72 hours  Studies/Results: Dg Abd 2 Views  2012-09-03  *RADIOLOGY REPORT*  Clinical Data: Small bowel obstruction  ABDOMEN - 2 VIEW  Comparison: 08/17/2012  Findings: There is nonspecific nonobstructive bowel gas pattern. Contrast material from recent CT scan noted in the right colon and transverse colon.  NG tube with tip in proximal stomach.  IMPRESSION: Nonspecific nonobstructive bowel gas pattern.  Contrast material from recent CT scan noted in the right colon and transverse colon. NG tube with tip in proximal stomach.   Original Report Authenticated By: Natasha Mead, M.D.     Anti-infectives: Anti-infectives    None      Assessment/Plan: s/p * No surgery found * Gastroenteritis  Discharge home  LOS: 2 days    Tayelor Osborne A 08/19/2012

## 2013-05-29 ENCOUNTER — Emergency Department (HOSPITAL_COMMUNITY): Payer: Medicaid Other

## 2013-05-29 ENCOUNTER — Encounter (HOSPITAL_COMMUNITY): Payer: Self-pay | Admitting: Emergency Medicine

## 2013-05-29 ENCOUNTER — Emergency Department (HOSPITAL_COMMUNITY)
Admission: EM | Admit: 2013-05-29 | Discharge: 2013-05-29 | Disposition: A | Discharge status: Home / Self Care | Payer: Medicaid Other | Attending: Emergency Medicine | Admitting: Emergency Medicine

## 2013-05-29 DIAGNOSIS — R51 Headache: Secondary | ICD-10-CM | POA: Insufficient documentation

## 2013-05-29 DIAGNOSIS — J329 Chronic sinusitis, unspecified: Secondary | ICD-10-CM | POA: Insufficient documentation

## 2013-05-29 DIAGNOSIS — I10 Essential (primary) hypertension: Secondary | ICD-10-CM | POA: Insufficient documentation

## 2013-05-29 DIAGNOSIS — R1110 Vomiting, unspecified: Secondary | ICD-10-CM

## 2013-05-29 DIAGNOSIS — H547 Unspecified visual loss: Secondary | ICD-10-CM | POA: Insufficient documentation

## 2013-05-29 DIAGNOSIS — Z8719 Personal history of other diseases of the digestive system: Secondary | ICD-10-CM | POA: Insufficient documentation

## 2013-05-29 DIAGNOSIS — Z8739 Personal history of other diseases of the musculoskeletal system and connective tissue: Secondary | ICD-10-CM | POA: Insufficient documentation

## 2013-05-29 LAB — COMPREHENSIVE METABOLIC PANEL
Albumin: 3.8 g/dL (ref 3.5–5.2)
BUN: 9 mg/dL (ref 6–23)
CO2: 28 mEq/L (ref 19–32)
Calcium: 9.6 mg/dL (ref 8.4–10.5)
Chloride: 99 mEq/L (ref 96–112)
Creatinine, Ser: 0.8 mg/dL (ref 0.50–1.35)
GFR calc non Af Amer: 90 mL/min — ABNORMAL LOW (ref >90)
Total Bilirubin: 0.7 mg/dL (ref 0.3–1.2)

## 2013-05-29 LAB — CBC WITH DIFFERENTIAL/PLATELET
Basophils Relative: 0 % (ref 0–1)
Eosinophils Relative: 1 % (ref 0–5)
HCT: 41.2 % (ref 39.0–52.0)
Hemoglobin: 14.5 g/dL (ref 13.0–17.0)
MCHC: 35.2 g/dL (ref 30.0–36.0)
MCV: 81.6 fL (ref 78.0–100.0)
Monocytes Absolute: 0.4 10*3/uL (ref 0.1–1.0)
Monocytes Relative: 5 % (ref 3–12)
Neutro Abs: 5.9 10*3/uL (ref 1.7–7.7)

## 2013-05-29 MED ORDER — AMOXICILLIN-POT CLAVULANATE 875-125 MG PO TABS: 1.0000 | ORAL_TABLET | Freq: Two times a day (BID) | ORAL | Status: DC

## 2013-05-29 MED ORDER — SODIUM CHLORIDE 0.9 % IV BOLUS (SEPSIS)
1000.0000 mL | Freq: Once | INTRAVENOUS | Status: AC
  Administered 2013-05-29: 1000 mL via INTRAVENOUS

## 2013-05-29 MED ORDER — MORPHINE SULFATE 4 MG/ML IJ SOLN
4.0000 mg | Freq: Once | INTRAMUSCULAR | Status: AC
  Administered 2013-05-29: 4 mg via INTRAVENOUS
  Filled 2013-05-29: qty 1

## 2013-05-29 NOTE — ED Provider Notes (Signed)
Patient signed out from Roxy Horseman PA-C pending MR results for four to five days of a headache, chronic visual disturbance, and emesis with disposition pending MR results. Headache improved. Patient complains of no pain or other associated symptoms upon my evaluation of patient.  Physical Exam  BP 166/93  Pulse 63  Temp(Src) 97.9 F (36.6 C) (Oral)  Resp 15  SpO2 94%  Physical Exam  Constitutional: He is oriented to person, place, and time. He appears well-developed and well-nourished. No distress.  HENT:  Head: Normocephalic and atraumatic.  Right Ear: External ear normal.  Left Ear: External ear normal.  Nose: Nose normal.  Eyes: Conjunctivae and EOM are normal. Pupils are equal, round, and reactive to light.  Neck: Normal range of motion. Neck supple.  Cardiovascular: Normal rate, regular rhythm and normal heart sounds.   Pulmonary/Chest: Effort normal and breath sounds normal. No respiratory distress.  Abdominal: Soft. There is no tenderness.  Musculoskeletal: Normal range of motion.  Neurological: He is alert and oriented to person, place, and time. No cranial nerve deficit.  No pronator drift.   Skin: Skin is warm and dry. He is not diaphoretic.    ED Course  Procedures  Medications  sodium chloride 0.9 % bolus 1,000 mL (0 mLs Intravenous Stopped 05/29/13 1446)  morphine 4 MG/ML injection 4 mg (4 mg Intravenous Given 05/29/13 1523)    Narrative: CLINICAL DATA: 67 year old male with headache visual changes and vomiting. Morning headaches. Query dural venous sinus thrombosis.  EXAM: MRI HEAD WITHOUT CONTRAST  MRV HEAD WITHOUT CONTRAST  TECHNIQUE: Multiplanar, multiecho pulse sequences of the brain and surrounding structures were obtained without intravenous contrast. Angiographic images of the head were obtained using MRV technique without contrast.  COMPARISON: Head CT without contrast 05/29/2013.  FINDINGS: MRI HEAD FINDINGS  Cerebral volume is within  normal limits for age. No restricted diffusion to suggest acute infarction. No midline shift, mass effect, evidence of mass lesion, ventriculomegaly, extra-axial collection or acute intracranial hemorrhage. Cervicomedullary junction and pituitary are within normal limits. Wallace Cullens and white matter signal is within normal limits for age throughout the brain. Negative visualized cervical spine.  Major intracranial vascular flow voids are preserved ; dominant appearing right transverse sinus drainage and dominant appearing distal left vertebral artery.  Visualized orbit soft tissues are within normal limits. Moderate widespread paranasal sinus mucosal thickening and opacification. The frontal sinuses are relatively spared. The mastoids are clear. Grossly normal visualized internal auditory structures. Normal bone marrow signal. Negative scalp soft tissues.  MRV HEAD FINDINGS  Preserved flow signal in the superior sagittal sinus, torcula, straight sinus, vein of Galen, internal cerebral veins, and basilar hands of Rosenthal. Dominant right transverse sinus drainage. Intermittent preserved flow signal in the left transverse sinus which does not appear abnormally expanded. The left transverse sinus received the left vein of lobe a. dominant appearing right sagittal sinus. Intermittent flow signal in the left sagittal sinus. Flow signal in both I age 52 bulbs. The right internal jugular vein appears dominant.  IMPRESSION: MRI HEAD IMPRESSION  1. Normal for age non contrast MRI appearance the brain.  2. Moderate paranasal sinus inflammatory disease, in conjunction with the recent CT suspect acute on chronic sinusitis.  MRV HEAD IMPRESSION  Negative intracranial MRV; dominant right transverse/sigmoid venous sinuses.   Electronically Signed By: Augusto Gamble M.D. On: 05/29/2013 16:37   MDM  Afebrile, NAD, non-toxic appearing, AAOx4. No neuro focal deficits on examination initially by  Roxy Horseman PA. No neural focal deficits on  my examination patient. Headache resolved. Patient has no further complaints of headache or emesis. MRI revealed normal for age non-contrast MR unconfirmed acute on chronic sinusitis that was noted by CT scan done earlier. MRV of head was also negative for intracranial abnormality. Advised patient to followup with neurology and ophthalmology as discussed with Roxy Horseman PA-C and Dr. Hyacinth Meeker if negative MR returned. Patient will be prescribed antibiotics for sinusitis. Discussed with patient and family about elevated blood pressure at this time. Discussed this would require a recheck by her primary care physician in one to 2 weeks. Return precautions discussed.  Patient agreeable to plan. Patient case has been discussed with Roxy Horseman PA-C and Dr. Hyacinth Meeker. Patient stable at time of discharge        Jeannetta Ellis, PA-C 05/30/13 1610

## 2013-05-29 NOTE — ED Provider Notes (Signed)
CSN: [id number]     Arrival date & time 05/29/13  1207 History   First MD Initiated Contact with Patient 05/29/13 1211     Chief Complaint  Patient presents with  . Emesis  . Headache   (Consider location/radiation/quality/duration/timing/severity/associated sxs/prior Treatment) HPI Comments: Patient presents to the emergency department with chief complaint of headache and vomiting. History is obtained via the patient's son, who acts as Nurse, learning disability. Patient states that he has had a headache every single morning which is associated with vision deficits, as well as vomiting. It is unclear as to how long these symptoms have been going on. Patient has not tried anything to alleviate his symptoms. He denies fevers, chills, chest pain, shortness of breath, numbness or weakness of the extremities.  The history is provided by the patient and a relative. The history is limited by a language barrier. A language interpreter was used.    Past Medical History  Diagnosis Date  . Hypertension   . Back pain   . DJD (degenerative joint disease)   . Scoliosis   . Bursitis   . GERD (gastroesophageal reflux disease)   . SBO (small bowel obstruction) 08/17/2012   Past Surgical History  Procedure Laterality Date  . No past surgeries     No family history on file. History  Substance Use Topics  . Smoking status: Never Smoker   . Smokeless tobacco: Current User    Types: Chew  . Alcohol Use: No    Review of Systems  All other systems reviewed and are negative.    Allergies  Review of patient's allergies indicates no known allergies.  Home Medications   Current Outpatient Rx  Name  Route  Sig  Dispense  Refill  . HYDROcodone-homatropine (HYCODAN) 5-1.5 MG/5ML syrup   Oral   Take 5 mLs by mouth every 4 (four) hours as needed for cough.          BP 159/103  Pulse 72  Temp(Src) 97.9 F (36.6 C) (Oral)  Resp 14  SpO2 96% Physical Exam  Nursing note and vitals  reviewed. Constitutional: He is oriented to person, place, and time. He appears well-developed and well-nourished.  HENT:  Head: Normocephalic and atraumatic.  Right Ear: External ear normal.  Left Ear: External ear normal.  Non-tender over temporal artery, no increased pain with chewing.  Eyes: Conjunctivae and EOM are normal. Pupils are equal, round, and reactive to light.  No papilledema  Neck: Normal range of motion. Neck supple.  No pain with neck flexion, no meningismus  Cardiovascular: Normal rate, regular rhythm and normal heart sounds.  Exam reveals no gallop and no friction rub.   No murmur heard. Pulmonary/Chest: Effort normal and breath sounds normal. No respiratory distress. He has no wheezes. He has no rales. He exhibits no tenderness.  Abdominal: Soft. Bowel sounds are normal. He exhibits no distension and no mass. There is no tenderness. There is no rebound and no guarding.  Musculoskeletal: Normal range of motion. He exhibits no edema and no tenderness.  Normal gait.  Neurological: He is alert and oriented to person, place, and time. He has normal reflexes.  CN 3-12 intact, no pronator drift, normal shin to heel, normal RAM, sensation and strength intact bilaterally.  Skin: Skin is warm and dry.  Psychiatric: He has a normal mood and affect. His behavior is normal. Judgment and thought content normal.    ED Course  Procedures (including critical care time) Labs Review Labs Reviewed  CBC WITH DIFFERENTIAL  COMPREHENSIVE METABOLIC PANEL   Results for orders placed during the hospital encounter of 05/29/13  CBC WITH DIFFERENTIAL      Result Value Range   WBC 8.1  4.0 - 10.5 K/uL   RBC 5.05  4.22 - 5.81 MIL/uL   Hemoglobin 14.5  13.0 - 17.0 g/dL   HCT 16.1  09.6 - 04.5 %   MCV 81.6  78.0 - 100.0 fL   MCH 28.7  26.0 - 34.0 pg   MCHC 35.2  30.0 - 36.0 g/dL   RDW 40.9  81.1 - 91.4 %   Platelets 229  150 - 400 K/uL   Neutrophils Relative % 73  43 - 77 %   Neutro  Abs 5.9  1.7 - 7.7 K/uL   Lymphocytes Relative 21  12 - 46 %   Lymphs Abs 1.7  0.7 - 4.0 K/uL   Monocytes Relative 5  3 - 12 %   Monocytes Absolute 0.4  0.1 - 1.0 K/uL   Eosinophils Relative 1  0 - 5 %   Eosinophils Absolute 0.1  0.0 - 0.7 K/uL   Basophils Relative 0  0 - 1 %   Basophils Absolute 0.0  0.0 - 0.1 K/uL  COMPREHENSIVE METABOLIC PANEL      Result Value Range   Sodium 137  135 - 145 mEq/L   Potassium 4.2  3.5 - 5.1 mEq/L   Chloride 99  96 - 112 mEq/L   CO2 28  19 - 32 mEq/L   Glucose, Bld 161 (*) 70 - 99 mg/dL   BUN 9  6 - 23 mg/dL   Creatinine, Ser 7.82  0.50 - 1.35 mg/dL   Calcium 9.6  8.4 - 95.6 mg/dL   Total Protein 7.5  6.0 - 8.3 g/dL   Albumin 3.8  3.5 - 5.2 g/dL   AST 30  0 - 37 U/L   ALT 17  0 - 53 U/L   Alkaline Phosphatase 92  39 - 117 U/L   Total Bilirubin 0.7  0.3 - 1.2 mg/dL   GFR calc non Af Amer 90 (*) >90 mL/min   GFR calc Af Amer >90  >90 mL/min   Ct Head Wo Contrast  05/29/2013   CLINICAL DATA:  Headache and vomiting  EXAM: CT HEAD WITHOUT CONTRAST  TECHNIQUE: Contiguous axial images were obtained from the base of the skull through the vertex without intravenous contrast.  COMPARISON:  None  FINDINGS: Ventricle size is normal. Negative for acute infarct, hemorrhage, or mass lesion.  Sinusitis with diffuse mucosal thickening throughout the paranasal sinuses. Prior medial antrectomy. No acute bony change.  IMPRESSION: No significant intracranial abnormality.  Sinusitis   Electronically Signed   By: Marlan Palau M.D.   On: 05/29/2013 14:22      MDM  No diagnosis found.   Patient with headache, visual complaints and vomiting. Patient discussed with Dr. Hyacinth Meeker. Will order basic labs, and CT head.  Labs are reassuring, CT head is remarkable only for sinusitis. We'll treat this with antibiotics, and discharged home. Recommend neurology followup if his symptoms do not improve. It is very possible that some of the history is lost in translation, as the  son is acting as a Nurse, learning disability. It's possible that the patient's eye complaint could actually be sinus pressure.  But assuming that it is not, will give neurology follow-up.    3:03 PM Discussed the results with Dr. Hyacinth Meeker, who recommends MRI for further evaluation.  MRI is pending. In  further discussion with the patient, he states that he has only had difficulty seeing for the past year. Prior to that if the fine.   Patient signed out to Piepen[place]ink, PA-C, who will follow-up on the results.  Plan:  Disposition pending MRI.    If negative, discharge with Neurology and Ophthalmology follow-up.  Patient speaks Gillis Santa, [place] 05/29/13 1540

## 2013-05-29 NOTE — Progress Notes (Signed)
ED CM received consult from Chelsea Aus CSW. In room to speak with patient and son in regards to PCP, Explained to patient and son the importance and benefits of having a PCP, and follow- ups. Son verbalizes understanding and is in agreement. Provided written and verbal  information on the [facility] and [facility], and asked if I could forward patient's information to the scheduler Barbaraann Boys at [facility] . Pt and son was in agreement. Pt and son instructed to contact the Clinic if they are not contacted within 1 week by someone from the [facility] and wellness clinic.Verbalized understanding. No further CM needs identified.

## 2013-05-29 NOTE — ED Notes (Signed)
Pt remains in MRI 

## 2013-05-29 NOTE — ED Notes (Signed)
Vomiting and h/a x 4-5 days

## 2013-05-29 NOTE — ED Notes (Signed)
Spoke with pt's son re: care for pt.  CSW determined that pt needed PCP referral RN CM Burna Mortimer notified.

## 2013-05-29 NOTE — Progress Notes (Signed)
CSW attempted to see Pt's son on two different occassions. Son was not in the room.   CSW will give report to oncoming CSW for follow up if needed.    Leron Croak, LCSWA [facility] Emergency Dept.  [phone]

## 2013-05-29 NOTE — ED Notes (Addendum)
Pt given water for fluid challenge.  Pt denies n/v

## 2013-05-29 NOTE — ED Notes (Signed)
Patient transported to MRI 

## 2013-05-29 NOTE — ED Provider Notes (Signed)
Medical screening examination/treatment/procedure(s) were performed by non-physician practitioner and as supervising physician I was immediately available for consultation/collaboration.    Vida Roller, MD 05/29/13 [phone]

## 2013-05-29 NOTE — ED Notes (Signed)
Napali interp phone given to md

## 2013-05-31 NOTE — ED Provider Notes (Signed)
Medical screening examination/treatment/procedure(s) were performed by non-physician practitioner and as supervising physician I was immediately available for consultation/collaboration.   Shelda Jakes, MD 05/31/13 [phone]

## 2013-06-14 ENCOUNTER — Ambulatory Visit: Payer: Medicaid Other | Admitting: Endocrinology

## 2015-10-17 ENCOUNTER — Inpatient Hospital Stay (HOSPITAL_COMMUNITY)
Admission: EM | Admit: 2015-10-17 | Discharge: 2015-10-26 | DRG: 417 | Disposition: A | Discharge status: Home / Self Care | Payer: Medicare Other | Attending: Internal Medicine | Admitting: Internal Medicine

## 2015-10-17 ENCOUNTER — Inpatient Hospital Stay (HOSPITAL_COMMUNITY): Payer: Medicare Other

## 2015-10-17 ENCOUNTER — Encounter (HOSPITAL_COMMUNITY): Payer: Self-pay

## 2015-10-17 ENCOUNTER — Emergency Department (HOSPITAL_COMMUNITY): Payer: Medicare Other

## 2015-10-17 DIAGNOSIS — Z9911 Dependence on respirator [ventilator] status: Secondary | ICD-10-CM

## 2015-10-17 DIAGNOSIS — K810 Acute cholecystitis: Secondary | ICD-10-CM | POA: Diagnosis not present

## 2015-10-17 DIAGNOSIS — R079 Chest pain, unspecified: Secondary | ICD-10-CM | POA: Insufficient documentation

## 2015-10-17 DIAGNOSIS — K567 Ileus, unspecified: Secondary | ICD-10-CM | POA: Diagnosis not present

## 2015-10-17 DIAGNOSIS — Z720 Tobacco use: Secondary | ICD-10-CM | POA: Diagnosis not present

## 2015-10-17 DIAGNOSIS — Z23 Encounter for immunization: Secondary | ICD-10-CM | POA: Diagnosis not present

## 2015-10-17 DIAGNOSIS — K838 Other specified diseases of biliary tract: Secondary | ICD-10-CM | POA: Diagnosis present

## 2015-10-17 DIAGNOSIS — J9601 Acute respiratory failure with hypoxia: Secondary | ICD-10-CM | POA: Diagnosis not present

## 2015-10-17 DIAGNOSIS — N179 Acute kidney failure, unspecified: Secondary | ICD-10-CM | POA: Diagnosis present

## 2015-10-17 DIAGNOSIS — I10 Essential (primary) hypertension: Secondary | ICD-10-CM | POA: Diagnosis present

## 2015-10-17 DIAGNOSIS — Z9049 Acquired absence of other specified parts of digestive tract: Secondary | ICD-10-CM | POA: Diagnosis not present

## 2015-10-17 DIAGNOSIS — J690 Pneumonitis due to inhalation of food and vomit: Secondary | ICD-10-CM | POA: Diagnosis not present

## 2015-10-17 DIAGNOSIS — M1990 Unspecified osteoarthritis, unspecified site: Secondary | ICD-10-CM | POA: Diagnosis present

## 2015-10-17 DIAGNOSIS — R109 Unspecified abdominal pain: Secondary | ICD-10-CM

## 2015-10-17 DIAGNOSIS — R0600 Dyspnea, unspecified: Secondary | ICD-10-CM | POA: Diagnosis not present

## 2015-10-17 DIAGNOSIS — K219 Gastro-esophageal reflux disease without esophagitis: Secondary | ICD-10-CM | POA: Diagnosis present

## 2015-10-17 DIAGNOSIS — K8000 Calculus of gallbladder with acute cholecystitis without obstruction: Principal | ICD-10-CM | POA: Diagnosis present

## 2015-10-17 DIAGNOSIS — J9811 Atelectasis: Secondary | ICD-10-CM | POA: Diagnosis not present

## 2015-10-17 DIAGNOSIS — K920 Hematemesis: Secondary | ICD-10-CM | POA: Diagnosis present

## 2015-10-17 DIAGNOSIS — M419 Scoliosis, unspecified: Secondary | ICD-10-CM | POA: Diagnosis present

## 2015-10-17 DIAGNOSIS — Z01818 Encounter for other preprocedural examination: Secondary | ICD-10-CM

## 2015-10-17 DIAGNOSIS — E119 Type 2 diabetes mellitus without complications: Secondary | ICD-10-CM

## 2015-10-17 DIAGNOSIS — Z419 Encounter for procedure for purposes other than remedying health state, unspecified: Secondary | ICD-10-CM

## 2015-10-17 DIAGNOSIS — G9340 Encephalopathy, unspecified: Secondary | ICD-10-CM | POA: Diagnosis present

## 2015-10-17 DIAGNOSIS — R1013 Epigastric pain: Secondary | ICD-10-CM | POA: Diagnosis present

## 2015-10-17 DIAGNOSIS — D62 Acute posthemorrhagic anemia: Secondary | ICD-10-CM | POA: Diagnosis not present

## 2015-10-17 DIAGNOSIS — E876 Hypokalemia: Secondary | ICD-10-CM | POA: Diagnosis not present

## 2015-10-17 HISTORY — DX: Type 2 diabetes mellitus without complications: E11.9

## 2015-10-17 LAB — COMPREHENSIVE METABOLIC PANEL
ALBUMIN: 4.1 g/dL (ref 3.5–5.0)
ALT: 28 U/L (ref 17–63)
AST: 33 U/L (ref 15–41)
Alkaline Phosphatase: 91 U/L (ref 38–126)
Anion gap: 15 (ref 5–15)
BUN: 9 mg/dL (ref 6–20)
CHLORIDE: 96 mmol/L — AB (ref 101–111)
CO2: 22 mmol/L (ref 22–32)
CREATININE: 0.88 mg/dL (ref 0.61–1.24)
Calcium: 9.8 mg/dL (ref 8.9–10.3)
GFR calc Af Amer: >60 mL/min (ref >60)
GLUCOSE: 272 mg/dL — AB (ref 65–99)
POTASSIUM: 4 mmol/L (ref 3.5–5.1)
Sodium: 133 mmol/L — ABNORMAL LOW (ref 135–145)
Total Bilirubin: 1.3 mg/dL — ABNORMAL HIGH (ref 0.3–1.2)
Total Protein: 7.6 g/dL (ref 6.5–8.1)

## 2015-10-17 LAB — CBC WITH DIFFERENTIAL/PLATELET
BASOS ABS: 0 10*3/uL (ref 0.0–0.1)
BASOS PCT: 0 %
EOS PCT: 1 %
Eosinophils Absolute: 0.1 10*3/uL (ref 0.0–0.7)
HCT: 45.3 % (ref 39.0–52.0)
Hemoglobin: 15.7 g/dL (ref 13.0–17.0)
LYMPHS PCT: 22 %
Lymphs Abs: 2.7 10*3/uL (ref 0.7–4.0)
MCH: 28.8 pg (ref 26.0–34.0)
MCHC: 34.7 g/dL (ref 30.0–36.0)
MCV: 83 fL (ref 78.0–100.0)
Monocytes Absolute: 0.6 10*3/uL (ref 0.1–1.0)
Monocytes Relative: 5 %
NEUTROS ABS: 8.6 10*3/uL — AB (ref 1.7–7.7)
Neutrophils Relative %: 72 %
PLATELETS: 272 10*3/uL (ref 150–400)
RBC: 5.46 MIL/uL (ref 4.22–5.81)
RDW: 13.6 % (ref 11.5–15.5)
WBC: 12.1 10*3/uL — AB (ref 4.0–10.5)

## 2015-10-17 LAB — URINALYSIS, ROUTINE W REFLEX MICROSCOPIC
Bilirubin Urine: NEGATIVE
GLUCOSE, UA: 500 mg/dL — AB
Ketones, ur: NEGATIVE mg/dL
LEUKOCYTES UA: NEGATIVE
Nitrite: NEGATIVE
PH: 7.5 (ref 5.0–8.0)
PROTEIN: NEGATIVE mg/dL

## 2015-10-17 LAB — URINE MICROSCOPIC-ADD ON: BACTERIA UA: NONE SEEN

## 2015-10-17 LAB — GLUCOSE, CAPILLARY
GLUCOSE-CAPILLARY: 177 mg/dL — AB (ref 65–99)
GLUCOSE-CAPILLARY: 193 mg/dL — AB (ref 65–99)
Glucose-Capillary: 220 mg/dL — ABNORMAL HIGH (ref 65–99)

## 2015-10-17 LAB — SURGICAL PCR SCREEN
MRSA, PCR: NEGATIVE
Staphylococcus aureus: POSITIVE — AB

## 2015-10-17 LAB — LIPASE, BLOOD: Lipase: 38 U/L (ref 11–51)

## 2015-10-17 LAB — I-STAT CG4 LACTIC ACID, ED: Lactic Acid, Venous: 1.59 mmol/L (ref 0.5–2.0)

## 2015-10-17 MED ORDER — AMLODIPINE BESYLATE 5 MG PO TABS
10.0000 mg | ORAL_TABLET | Freq: Once | ORAL | Status: AC
Start: 2015-10-17 — End: 2015-10-17
  Administered 2015-10-17: 10 mg via ORAL
  Filled 2015-10-17: qty 2

## 2015-10-17 MED ORDER — ALUM & MAG HYDROXIDE-SIMETH 200-200-20 MG/5ML PO SUSP
30.0000 mL | Freq: Once | ORAL | Status: AC
  Administered 2015-10-17: 30 mL via ORAL
  Filled 2015-10-17: qty 30

## 2015-10-17 MED ORDER — IOHEXOL 300 MG/ML  SOLN: 80.0000 mL | Freq: Once | INTRAMUSCULAR | Status: DC | PRN

## 2015-10-17 MED ORDER — SUCRALFATE 1 GM/10ML PO SUSP
1.0000 g | Freq: Once | ORAL | Status: AC
  Administered 2015-10-17: 1 g via ORAL
  Filled 2015-10-17: qty 10

## 2015-10-17 MED ORDER — PIPERACILLIN-TAZOBACTAM 3.375 G IVPB
3.3750 g | Freq: Three times a day (TID) | INTRAVENOUS | Status: DC
  Administered 2015-10-17 – 2015-10-19 (×5): 3.375 g via INTRAVENOUS
  Filled 2015-10-17 (×8): qty 50

## 2015-10-17 MED ORDER — LISINOPRIL 10 MG PO TABS
10.0000 mg | ORAL_TABLET | Freq: Every day | ORAL | Status: DC
  Administered 2015-10-17: 10 mg via ORAL
  Filled 2015-10-17: qty 1

## 2015-10-17 MED ORDER — ONDANSETRON HCL 4 MG/2ML IJ SOLN
4.0000 mg | Freq: Four times a day (QID) | INTRAMUSCULAR | Status: DC | PRN
  Administered 2015-10-19 – 2015-10-21 (×2): 4 mg via INTRAVENOUS
  Filled 2015-10-17 (×2): qty 2

## 2015-10-17 MED ORDER — SODIUM CHLORIDE 0.9 % IV SOLN
INTRAVENOUS | Status: DC
  Administered 2015-10-17: 13:00:00 via INTRAVENOUS

## 2015-10-17 MED ORDER — HEPARIN SODIUM (PORCINE) 5000 UNIT/ML IJ SOLN
5000.0000 [IU] | Freq: Three times a day (TID) | INTRAMUSCULAR | Status: AC
  Administered 2015-10-17 (×2): 5000 [IU] via SUBCUTANEOUS
  Filled 2015-10-17 (×2): qty 1

## 2015-10-17 MED ORDER — INSULIN ASPART 100 UNIT/ML ~~LOC~~ SOLN
0.0000 [IU] | SUBCUTANEOUS | Status: DC
  Administered 2015-10-17: 3 [IU] via SUBCUTANEOUS
  Administered 2015-10-17 (×2): 2 [IU] via SUBCUTANEOUS
  Administered 2015-10-18: 1 [IU] via SUBCUTANEOUS
  Administered 2015-10-18: 2 [IU] via SUBCUTANEOUS
  Administered 2015-10-18 – 2015-10-19 (×2): 1 [IU] via SUBCUTANEOUS

## 2015-10-17 MED ORDER — ACETAMINOPHEN 325 MG PO TABS
650.0000 mg | ORAL_TABLET | Freq: Four times a day (QID) | ORAL | Status: DC | PRN
  Administered 2015-10-17 – 2015-10-19 (×3): 650 mg via ORAL
  Filled 2015-10-17 (×3): qty 2

## 2015-10-17 MED ORDER — MORPHINE SULFATE (PF) 2 MG/ML IV SOLN
2.0000 mg | Freq: Once | INTRAVENOUS | Status: AC
  Administered 2015-10-17: 2 mg via INTRAVENOUS
  Filled 2015-10-17: qty 1

## 2015-10-17 MED ORDER — MORPHINE SULFATE (PF) 2 MG/ML IV SOLN
2.0000 mg | INTRAVENOUS | Status: DC | PRN
  Administered 2015-10-17 – 2015-10-19 (×5): 2 mg via INTRAVENOUS
  Filled 2015-10-17 (×6): qty 1

## 2015-10-17 MED ORDER — PANTOPRAZOLE SODIUM 40 MG PO TBEC
40.0000 mg | DELAYED_RELEASE_TABLET | Freq: Every day | ORAL | Status: DC
  Administered 2015-10-17 – 2015-10-19 (×2): 40 mg via ORAL
  Filled 2015-10-17 (×2): qty 1

## 2015-10-17 MED ORDER — RANITIDINE HCL 150 MG/10ML PO SYRP
300.0000 mg | ORAL_SOLUTION | Freq: Once | ORAL | Status: AC
  Administered 2015-10-17: 300 mg via ORAL
  Filled 2015-10-17: qty 20

## 2015-10-17 MED ORDER — INFLUENZA VAC SPLIT QUAD 0.5 ML IM SUSY
0.5000 mL | PREFILLED_SYRINGE | INTRAMUSCULAR | Status: AC
  Administered 2015-10-26: 0.5 mL via INTRAMUSCULAR
  Filled 2015-10-17: qty 0.5

## 2015-10-17 MED ORDER — PIPERACILLIN-TAZOBACTAM 3.375 G IVPB 30 MIN
3.3750 g | Freq: Once | INTRAVENOUS | Status: AC
  Administered 2015-10-17: 3.375 g via INTRAVENOUS
  Filled 2015-10-17: qty 50

## 2015-10-17 MED ORDER — SODIUM CHLORIDE 0.9 % IV SOLN
INTRAVENOUS | Status: DC
  Administered 2015-10-17 (×2): via INTRAVENOUS
  Administered 2015-10-19: 1 mL via INTRAVENOUS

## 2015-10-17 MED ORDER — PNEUMOCOCCAL VAC POLYVALENT 25 MCG/0.5ML IJ INJ: 0.5000 mL | INJECTION | INTRAMUSCULAR | Status: DC

## 2015-10-17 MED ORDER — ONDANSETRON HCL 4 MG PO TABS: 4.0000 mg | ORAL_TABLET | Freq: Four times a day (QID) | ORAL | Status: DC | PRN

## 2015-10-17 NOTE — ED Notes (Signed)
Back from CT, no changes.  

## 2015-10-17 NOTE — Progress Notes (Signed)
First chg bath completed for tonight

## 2015-10-17 NOTE — H&P (Signed)
Date: 10/17/2015               Patient Name:  Francisco [name] MRN: [id number]  DOB: [birth year] Age / Sex: 70 y.o., male   PCP: No primary care provider on file.         Medical Service: Internal Medicine Teaching Service         Attending Physician: Dr. Doneen Poisson, MD    First Contact: Dr. Deneise Lever Pager: [phone]  Second Contact: Dr. Jill Alexanders  Pager: [phone]       After Hours (After 5p/  First Contact Pager: [phone]  weekends / holidays): Second Contact Pager: [phone]   Chief Complaint: abd pain  History of Present Illness:   This is a 70 yo man with newly diagnosed Diabetes and hypertension 3 days ago at urgent care who is here for epigastric pain since last night. Patient speaks Nepali-Hindi and his son was present at bedside who translated the information. Patient was in his usual health and ate his dinner yesterday at 5 PM and he was experiencing some abdominal pain at that time, but the pain worsened around 2 AM last night, and pt describes it as sharp non radiating 10/10 pain, and currently 2/10 after receiving pain medicines. Pt does not know what makes the pain worse, and bending forward makes it better.  Patient denies nausea, vomiting, diarrhea, constipation, hematochezia, or melena or dysuria or hematuria. He denies fevers, chills, weight changes, changes in appetite or other constitutional symptoms. He has some shortness of breath, only with coughing. He denies orthopnea or PND. No history of heart disease. He was having some "heartburn" 2 days ago and went to [facility] and was found to have elevated CBGs, a1c unknown. They started him on metformin 500 mg daily, and also gave naproxen and flexeril.   He was admitted here in 2013 for 'high grade small bowel obstruction" showing on CT  which resolved on bowel rest and IV hydration  own and the discharge diagnosis was 'gastroenteritis'  In the ER, CT abd was done which shows acute  cholecystitis with dilated common bile duct. He was started on Zosyn and morphine and surgery was consulted.    PMH: new diagnosis of T2DM and HTN, arthritis, GERD  FH:  Patient and son does not recall any family history of diabetes, htn, or cancer.   SH: Patient lives at home with son, and He moved from [place]  7 years ago.         He takes the tobacco for many years. No smoking. No alcohol or other illicit drug use.    Meds: Current Facility-Administered Medications  Medication Dose Route Frequency Provider Last Rate Last Dose  . iohexol (OMNIPAQUE) 300 MG/ML solution 80 mL  80 mL Intravenous Once PRN Tomasita Crumble, MD      . morphine 2 MG/ML injection 2 mg  2 mg Intravenous Q4H PRN Alexa Dulcy Fanny, MD       Current Outpatient Prescriptions  Medication Sig Dispense Refill  . HYDROcodone-homatropine (HYCODAN) 5-1.5 MG/5ML syrup Take 5 mLs by mouth every 4 (four) hours as needed for cough.      Allergies: Allergies as of 10/17/2015  . (No Known Allergies)   Past Medical History  Diagnosis Date  . Hypertension   . Back pain   . DJD (degenerative joint disease)   . Scoliosis   . Bursitis   . GERD (gastroesophageal reflux disease)   . SBO (small bowel  obstruction) (HCC) 08/17/2012  . Type 2 diabetes mellitus (HCC) 10/17/2015   Past Surgical History  Procedure Laterality Date  . No past surgeries     History reviewed. No pertinent family history. Social History   Social History  . Marital Status: Married    Spouse Name: N/A  . Number of Children: N/A  . Years of Education: N/A   Occupational History  . Not on file.   Social History Main Topics  . Smoking status: Never Smoker   . Smokeless tobacco: Current User    Types: Chew  . Alcohol Use: No  . Drug Use: No  . Sexual Activity: Not on file   Other Topics Concern  . Not on file   Social History Narrative    Review of Systems: Pertinent items noted in HPI and remainder of comprehensive ROS otherwise  negative.  Physical Exam: Blood pressure 150/104, pulse 102, resp. rate 20, SpO2 95 %.  General: Vital signs reviewed. Patient in no acute distress and laying in bed, son present at bedside  HEENT: PERRLA, MMM, no loose dentition, pharynx normal Cardiovascular: regular rate, rhythm, no murmur appreciated  Pulmonary/Chest: some bibasilar crackles heard, no wheezing,  Abdominal: Soft, non-tender except some in epigastric area, negative Murphys sign  Extremities: No lower extremity edema bilaterally, pulses symmetric and intact bilaterally. Skin: Warm, dry and intact. No rashes or erythema.    Lab results: Results for orders placed or performed during the hospital encounter of 10/17/15 (from the past 24 hour(s))  CBC with Differential/Platelet     Status: Abnormal   Collection Time: 10/17/15  3:35 AM  Result Value Ref Range   WBC 12.1 (H) 4.0 - 10.5 K/uL   RBC 5.46 4.22 - 5.81 MIL/uL   Hemoglobin 15.7 13.0 - 17.0 g/dL   HCT 16.1 09.6 - 04.5 %   MCV 83.0 78.0 - 100.0 fL   MCH 28.8 26.0 - 34.0 pg   MCHC 34.7 30.0 - 36.0 g/dL   RDW 40.9 81.1 - 91.4 %   Platelets 272 150 - 400 K/uL   Neutrophils Relative % 72 %   Neutro Abs 8.6 (H) 1.7 - 7.7 K/uL   Lymphocytes Relative 22 %   Lymphs Abs 2.7 0.7 - 4.0 K/uL   Monocytes Relative 5 %   Monocytes Absolute 0.6 0.1 - 1.0 K/uL   Eosinophils Relative 1 %   Eosinophils Absolute 0.1 0.0 - 0.7 K/uL   Basophils Relative 0 %   Basophils Absolute 0.0 0.0 - 0.1 K/uL  Comprehensive metabolic panel     Status: Abnormal   Collection Time: 10/17/15  3:35 AM  Result Value Ref Range   Sodium 133 (L) 135 - 145 mmol/L   Potassium 4.0 3.5 - 5.1 mmol/L   Chloride 96 (L) 101 - 111 mmol/L   CO2 22 22 - 32 mmol/L   Glucose, Bld 272 (H) 65 - 99 mg/dL   BUN 9 6 - 20 mg/dL   Creatinine, Ser 7.82 0.61 - 1.24 mg/dL   Calcium 9.8 8.9 - 95.6 mg/dL   Total Protein 7.6 6.5 - 8.1 g/dL   Albumin 4.1 3.5 - 5.0 g/dL   AST 33 15 - 41 U/L   ALT 28 17 - 63 U/L    Alkaline Phosphatase 91 38 - 126 U/L   Total Bilirubin 1.3 (H) 0.3 - 1.2 mg/dL   GFR calc non Af Amer >60 >60 mL/min   GFR calc Af Amer >60 >60 mL/min   Anion gap  15 5 - 15  Lipase, blood     Status: None   Collection Time: 10/17/15  3:35 AM  Result Value Ref Range   Lipase 38 11 - 51 U/L  I-Stat CG4 Lactic Acid, ED     Status: None   Collection Time: 10/17/15  3:44 AM  Result Value Ref Range   Lactic Acid, Venous 1.59 0.5 - 2.0 mmol/L  Urinalysis, Routine w reflex microscopic (not at [facility])     Status: Abnormal   Collection Time: 10/17/15  9:04 AM  Result Value Ref Range   Color, Urine YELLOW YELLOW   APPearance CLEAR CLEAR   Specific Gravity, Urine <1.005 (L) 1.005 - 1.030   pH 7.5 5.0 - 8.0   Glucose, UA 500 (A) NEGATIVE mg/dL   Hgb urine dipstick TRACE (A) NEGATIVE   Bilirubin Urine NEGATIVE NEGATIVE   Ketones, ur NEGATIVE NEGATIVE mg/dL   Protein, ur NEGATIVE NEGATIVE mg/dL   Nitrite NEGATIVE NEGATIVE   Leukocytes, UA NEGATIVE NEGATIVE  Urine microscopic-add on     Status: Abnormal   Collection Time: 10/17/15  9:04 AM  Result Value Ref Range   Squamous Epithelial / LPF 0-5 (A) NONE SEEN   WBC, UA 0-5 0 - 5 WBC/hpf   RBC / HPF 0-5 0 - 5 RBC/hpf   Bacteria, UA NONE SEEN NONE SEEN     Imaging results:  Ct Abdomen Pelvis W Contrast  10/17/2015  CLINICAL DATA:  Gradually worsening epigastric pain onset 2 nights ago. Leukocytosis and evelated bilirubin. EXAM: CT ABDOMEN AND PELVIS WITH CONTRAST TECHNIQUE: Multidetector CT imaging of the abdomen and pelvis was performed using the standard protocol following bolus administration of intravenous contrast. CONTRAST:  80 cc Omnipaque 300 intravenous COMPARISON:  12/21/2008 FINDINGS: Lower chest and abdominal wall: Fluid in the right inguinal canal is likely ascitic fluid within hernia given absence in 2010 (which would suggest an encysted hydrocele ). Cardiomegaly Hepatobiliary: Cholelithiasis with hydropic gallbladder wall thickening,  pericholecystic edema, and perfusion anomaly along the gallbladder fossa. Dilated common bile duct at 11 mm without calcified choledocholithiasis. Pancreas: Unremarkable. Spleen: Unremarkable. Adrenals/Urinary Tract: Negative adrenals. No hydronephrosis or stone. Left renal cyst. Unremarkable bladder. Reproductive:No pathologic findings. Stomach/Bowel:  No obstruction. No appendicitis. Vascular/Lymphatic: No acute vascular abnormality. 13 mm nodule posterior to the left renal artery is stable from 08/17/2012. Peritoneal: No ascites or pneumoperitoneum. Musculoskeletal: No acute abnormalities. IMPRESSION: 1. Acute cholecystitis. 2. Dilated common bile duct without visible choledocholithiasis. Electronically Signed   By: Marnee Spring M.D.   On: 10/17/2015 05:46    Other results: EKG: ordered   Assessment & Plan by Problem: Principal Problem:   Acute cholecystitis Active Problems:   GERD   Hypertension   Type 2 diabetes mellitus (HCC)  Acute cholecystitis with cholelithiasis: Pt with 1 day history of sharp non radiating 10/10 epigastric abd pain and no other symptoms who on CT was found to have cholelithiasis and gallbladder wall thickening  And 11mm dilated common bile duct. He has history of pSBO in 2013 that was resolved with IV hydration and bowel rest. CT abd in 2013 did show 2 cm gallstone and lipase was mildly elevated in the past to 68.  Currently Afebrile, and some high BP, but other VSS. Leukocytosis to 12.1 and Tbili to 1.3. Lipase normal. Other labs unremarkable. So, given the CT findings, other differentials are less likely.   -surgery following and appreciate recs -NPO -zosyn per pharm -NS at 100 ml/hr  -morphine 2 mg q4 hours  PRN  -zofran 4 mg q6 PRN  Follow up: -EKG  -CBC and BMET -CXR   HTN: Blood pressures have been elevated in the past per past admission notes. He is not on any meds. He was given amlodipine earlier in the ER  -started lisinopril, given his  T2DM  GERD: The symptoms he is describing sound like GERD, but most likely it is referred pain from cholecystitis. He recd ranitidine, carafate and maalox for symptomatic relief in ER.   -protonix 40 mg daily   T2DM: Pt was started on metformin 500 mg qd 2 days ago at urgent care center. No previous follow ups. Holding metformin  -ordered A1c -SSI  F NS 100 cc/hr E N NPO   DVT ppx: holding heparin  Code- full   Dispo: Disposition is deferred at this time, awaiting improvement of current medical problems. Anticipated discharge in approximately 2 day(s).   The patient does not have a current PCP (No primary care provider on file.) and does need an [facility] hospital follow-up appointment after discharge.  The patient does not have transportation limitations that hinder transportation to clinic appointments.  Signed: Deneise Lever, MD 10/17/2015, 10:14 AM

## 2015-10-17 NOTE — ED Notes (Signed)
Pt to CT, no changes, VSS, BP remains high, family at [facility], rates pain 8/10.

## 2015-10-17 NOTE — ED Notes (Signed)
Pt complains of epigastric pain that started last night. Denies n/v/d. Pt speak napoli. Pt alert and oriented x 4.

## 2015-10-17 NOTE — Progress Notes (Signed)
Pharmacy Consult for:  IV Zosyn Indication:  R/o intra-abdominal infection  69yo admitted with c/o abd. Pain  PMH: . Hypertension  . Back pain  . DJD (degenerative joint disease)  . Scoliosis  . Bursitis  . GERD (gastroesophageal reflux disease)  . SBO (small bowel obstruction) (HCC)  . Type 2 diabetes mellitus (HCC)        ACTIVE PROBLEMS: CT abd was done in ED which shows acute cholecystitis with dilated common bile duct - he will be started on IV Zosyn - ID: Tmax 100.8, WBC=12.1,LA=1.6, hypertensive, tachycardia, renal fxn. fine ABX:  Zosyn 3.375 2/4>> CLT: - None thus far  - PLAN: - Zosyn 3.375gm IV every 8 hours - f/u renal fxn, LOT and clinical response       Nadara Mustard, PharmD., MS Clinical Pharmacist Pager:  [phone] Thank you for allowing pharmacy to be part of this patients care team.

## 2015-10-17 NOTE — ED Notes (Signed)
Dr. Mora Bellman at [facility], updating pt & family.

## 2015-10-17 NOTE — Consult Note (Signed)
Reason for Consult:acute cholecystits Referring Physician: Dr. Oval Linsey  Francisco [name] is an 70 y.o. male.  HPI: This is a gentleman from [place] who does not speaking [place]. 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  He was admitted after a CAT scan of the abdomen and pelvis showed findings as with acute cholecystitis. The CT also showed a dilated common bile duct and his bilirubin is elevated. He's been having epigastric abdominal pain for approximately 2 days. It became worse last evening and he presented to the hospital.  After talking with his family, he has been having attacks of discomfort for approximately one year. He denies any nausea or vomiting. In the emergency department, he was found to be very hypertensive with a systolic blood pressure 756. Currently he has minimal abdominal pain.  Past Medical History  Diagnosis Date  . Hypertension   . Back pain   . DJD (degenerative joint disease)   . Scoliosis   . Bursitis   . GERD (gastroesophageal reflux disease)   . SBO (small bowel obstruction) ([place]) 08/17/2012  . Type 2 diabetes mellitus ([place]) 10/17/2015    Past Surgical History  Procedure Laterality Date  . No past surgeries      History reviewed. No pertinent family history.  Social History:  reports that he has never smoked. His smokeless tobacco use includes Chew. He reports that he does not drink alcohol or use illicit drugs.  Allergies: No Known Allergies  Medications: I have reviewed the patient's current medications.  Results for orders placed or performed during the hospital encounter of 10/17/15 (from the past 48 hour(s))  CBC with Differential/Platelet     Status: Abnormal   Collection Time: 10/17/15  3:35 AM  Result Value Ref Range   WBC 12.1 (H) 4.0 - 10.5 K/uL   RBC 5.46 4.22 - 5.81 MIL/uL   Hemoglobin 15.7 13.0 - 17.0 g/dL   HCT 45.3 39.0 - 52.0 %   MCV 83.0 78.0 - 100.0 fL   MCH 28.8 26.0 - 34.0 pg   MCHC 34.7 30.0  - 36.0 g/dL   RDW 13.6 11.5 - 15.5 %   Platelets 272 150 - 400 K/uL   Neutrophils Relative % 72 %   Neutro Abs 8.6 (H) 1.7 - 7.7 K/uL   Lymphocytes Relative 22 %   Lymphs Abs 2.7 0.7 - 4.0 K/uL   Monocytes Relative 5 %   Monocytes Absolute 0.6 0.1 - 1.0 K/uL   Eosinophils Relative 1 %   Eosinophils Absolute 0.1 0.0 - 0.7 K/uL   Basophils Relative 0 %   Basophils Absolute 0.0 0.0 - 0.1 K/uL  Comprehensive metabolic panel     Status: Abnormal   Collection Time: 10/17/15  3:35 AM  Result Value Ref Range   Sodium 133 (L) 135 - 145 mmol/L   Potassium 4.0 3.5 - 5.1 mmol/L   Chloride 96 (L) 101 - 111 mmol/L   CO2 22 22 - 32 mmol/L   Glucose, Bld 272 (H) 65 - 99 mg/dL   BUN 9 6 - 20 mg/dL   Creatinine, Ser 0.88 0.61 - 1.24 mg/dL   Calcium 9.8 8.9 - 10.3 mg/dL   Total Protein 7.6 6.5 - 8.1 g/dL   Albumin 4.1 3.5 - 5.0 g/dL   AST 33 15 - 41 U/L   ALT 28 17 - 63 U/L   Alkaline Phosphatase 91 38 - 126 U/L   Total Bilirubin 1.3 (H) 0.3 -  1.2 mg/dL   GFR calc non Af Amer >60 >60 mL/min   GFR calc Af Amer >60 >60 mL/min    Comment: (NOTE) The eGFR has been calculated using the CKD EPI equation. This calculation has not been validated in all clinical situations. eGFR's persistently <60 mL/min signify possible Chronic Kidney Disease.    Anion gap 15 5 - 15  Lipase, blood     Status: None   Collection Time: 10/17/15  3:35 AM  Result Value Ref Range   Lipase 38 11 - 51 U/L  I-Stat CG4 Lactic Acid, ED     Status: None   Collection Time: 10/17/15  3:44 AM  Result Value Ref Range   Lactic Acid, Venous 1.59 0.5 - 2.0 mmol/L  Urinalysis, Routine w reflex microscopic (not at [facility])     Status: Abnormal   Collection Time: 10/17/15  9:04 AM  Result Value Ref Range   Color, Urine YELLOW YELLOW   APPearance CLEAR CLEAR   Specific Gravity, Urine <1.005 (L) 1.005 - 1.030   pH 7.5 5.0 - 8.0   Glucose, UA 500 (A) NEGATIVE mg/dL   Hgb urine dipstick TRACE (A) NEGATIVE   Bilirubin Urine NEGATIVE  NEGATIVE   Ketones, ur NEGATIVE NEGATIVE mg/dL   Protein, ur NEGATIVE NEGATIVE mg/dL   Nitrite NEGATIVE NEGATIVE   Leukocytes, UA NEGATIVE NEGATIVE  Urine microscopic-add on     Status: Abnormal   Collection Time: 10/17/15  9:04 AM  Result Value Ref Range   Squamous Epithelial / LPF 0-5 (A) NONE SEEN   WBC, UA 0-5 0 - 5 WBC/hpf   RBC / HPF 0-5 0 - 5 RBC/hpf   Bacteria, UA NONE SEEN NONE SEEN    Ct Abdomen Pelvis W Contrast  10/17/2015  CLINICAL DATA:  Gradually worsening epigastric pain onset 2 nights ago. Leukocytosis and evelated bilirubin. EXAM: CT ABDOMEN AND PELVIS WITH CONTRAST TECHNIQUE: Multidetector CT imaging of the abdomen and pelvis was performed using the standard protocol following bolus administration of intravenous contrast. CONTRAST:  80 cc Omnipaque 300 intravenous COMPARISON:  12/21/2008 FINDINGS: Lower chest and abdominal wall: Fluid in the right inguinal canal is likely ascitic fluid within hernia given absence in 2010 (which would suggest an encysted hydrocele ). Cardiomegaly Hepatobiliary: Cholelithiasis with hydropic gallbladder wall thickening, pericholecystic edema, and perfusion anomaly along the gallbladder fossa. Dilated common bile duct at 11 mm without calcified choledocholithiasis. Pancreas: Unremarkable. Spleen: Unremarkable. Adrenals/Urinary Tract: Negative adrenals. No hydronephrosis or stone. Left renal cyst. Unremarkable bladder. Reproductive:No pathologic findings. Stomach/Bowel:  No obstruction. No appendicitis. Vascular/Lymphatic: No acute vascular abnormality. 13 mm nodule posterior to the left renal artery is stable from 08/17/2012. Peritoneal: No ascites or pneumoperitoneum. Musculoskeletal: No acute abnormalities. IMPRESSION: 1. Acute cholecystitis. 2. Dilated common bile duct without visible choledocholithiasis. Electronically Signed   By: Monte Fantasia M.D.   On: 10/17/2015 05:46    Review of Systems  All other systems reviewed and are  negative.  Blood pressure 158/115, pulse 118, temperature 100.8 F (38.2 C), temperature source Oral, resp. rate 22, SpO2 97 %. Physical Exam  Constitutional: He is oriented to person, place, and time. He appears well-developed and well-nourished. No distress.  HENT:  Head: Normocephalic and atraumatic.  Right Ear: External ear normal.  Left Ear: External ear normal.  Nose: Nose normal.  Mouth/Throat: Oropharynx is clear and moist. No oropharyngeal exudate.  Eyes: Conjunctivae are normal. Pupils are equal, round, and reactive to light. Right eye exhibits no discharge. Left eye exhibits  no discharge. No scleral icterus.  Neck: Normal range of motion. Neck supple. No tracheal deviation present.  Cardiovascular: Normal rate, regular rhythm, normal heart sounds and intact distal pulses.   No murmur heard. Respiratory: Breath sounds normal. No respiratory distress.  GI: Soft. He exhibits no distension. There is no guarding.  There is minimal tenderness in the right upper quadrant  Musculoskeletal: Normal range of motion. He exhibits no edema or tenderness.  Lymphadenopathy:    He has no cervical adenopathy.  Neurological: He is alert and oriented to person, place, and time.  Skin: Skin is dry.  Psychiatric: His behavior is normal.    Assessment/Plan: Acute cholecystitis with cholelithiasis  He does have the potential to have a common bile duct stone. Clinically, he is improving. We will repeat his liver function tests in the morning. If they have not increased and his blood pressure is under better control, the plan will be to proceed to the operating room for a laparoscopic cholecystectomy with possible cholangiogram. I discussed the procedure with him through his family in detail. I discussed risk of surgery which includes but is not limited to bleeding, infection, bile duct injury, bile leak, injury to other structures, the need to convert to the procedure, cardiopulmonary issues, postop  recovery, etc. He agrees to proceed.  Gaynor Ferreras A 10/17/2015, 1:43 PM

## 2015-10-17 NOTE — ED Provider Notes (Signed)
Dr. Magnus Ivan contacted me. Request patient get admitted to medical service. I spoke with Dr. Senaida Ores who accepts patient for admission. He will be admitted to [facility] floor  Doug Sou, MD 10/17/15 [phone]

## 2015-10-17 NOTE — ED Notes (Signed)
Admitting MD at bedside.

## 2015-10-17 NOTE — ED Notes (Addendum)
BP remains elevated, EDP notified, no news orders. pending CT. Pt remains alert, NAD, calm, interactive, no dyspnea noted, son at [facility], prefers to have family translate, translator offered, declined.

## 2015-10-17 NOTE — ED Provider Notes (Signed)
CSN: [id number]     Arrival date & time 10/17/15  0304 History  By signing my name below, I, Emmanuella Mensah, attest that this documentation has been prepared under the direction and in the presence of Tomasita Crumble, MD. Electronically Signed: Angelene Giovanni, ED Scribe. 10/17/2015. 3:44 AM.    Chief Complaint  Patient presents with  . Abdominal Pain   The history is provided by the patient. No language interpreter was used.   HPI Comments: Claudio Mondry is a 70 y.o. male with a hx of HTN, DM, SBO, and GERD who presents to the Emergency Department complaining of gradually worsening constant epigastric pain onset 2 nights ago that is consistent with heartburn. Pt does not currently take any medications for his HTN and DM. No alleviating factors noted. Pt has not taken any medications for his symptoms. He denies any fever, n/v/d, or urinary symptoms.    Past Medical History  Diagnosis Date  . Hypertension   . Back pain   . DJD (degenerative joint disease)   . Scoliosis   . Bursitis   . GERD (gastroesophageal reflux disease)   . SBO (small bowel obstruction) 08/17/2012   Past Surgical History  Procedure Laterality Date  . No past surgeries     No family history on file. Social History  Substance Use Topics  . Smoking status: Never Smoker   . Smokeless tobacco: Current User    Types: Chew  . Alcohol Use: No    Review of Systems  Constitutional: Negative for fever.  Gastrointestinal: Positive for abdominal pain. Negative for nausea, vomiting and diarrhea.  Genitourinary: Negative for dysuria, frequency and hematuria.  All other systems reviewed and are negative.  A complete 10 system review of systems was obtained and all systems are negative except as noted in the HPI and PMH.     Allergies  Review of patient's allergies indicates no known allergies.  Home Medications   Prior to Admission medications   Medication Sig Start Date End Date Taking? Authorizing Provider   amoxicillin-clavulanate (AUGMENTIN) 875-125 MG per tablet Take 1 tablet by mouth every 12 (twelve) hours. 05/29/13   Jennifer Piepenbrink, PA-C  HYDROcodone-homatropine (HYCODAN) 5-1.5 MG/5ML syrup Take 5 mLs by mouth every 4 (four) hours as needed for cough.    Historical Provider, MD   BP 181/129 mmHg  Pulse 93  Resp 15  SpO2 98% Physical Exam  Constitutional: He is oriented to person, place, and time. Vital signs are normal. He appears well-developed and well-nourished.  Non-toxic appearance. He does not appear ill. He appears distressed.  HENT:  Head: Normocephalic and atraumatic.  Nose: Nose normal.  Mouth/Throat: Oropharynx is clear and moist. No oropharyngeal exudate.  Eyes: Conjunctivae and EOM are normal. Pupils are equal, round, and reactive to light. No scleral icterus.  Neck: Normal range of motion. Neck supple. No tracheal deviation, no edema, no erythema and normal range of motion present. No thyroid mass and no thyromegaly present.  Cardiovascular: Normal rate, regular rhythm, S1 normal, S2 normal, normal heart sounds, intact distal pulses and normal pulses.  Exam reveals no gallop and no friction rub.   No murmur heard. Pulmonary/Chest: Effort normal and breath sounds normal. No respiratory distress. He has no wheezes. He has no rhonchi. He has no rales.  Abdominal: Soft. Normal appearance and bowel sounds are normal. He exhibits no distension, no ascites and no mass. There is no hepatosplenomegaly. There is tenderness. There is no rebound, no guarding and no CVA tenderness.  Mid epigastric TTP  Musculoskeletal: Normal range of motion. He exhibits no edema or tenderness.  Lymphadenopathy:    He has no cervical adenopathy.  Neurological: He is alert and oriented to person, place, and time. He has normal strength. No cranial nerve deficit or sensory deficit.  Skin: Skin is warm, dry and intact. No petechiae and no rash noted. He is not diaphoretic. No erythema. No pallor.   Psychiatric: He has a normal mood and affect. His behavior is normal. Judgment normal.  Nursing note and vitals reviewed.   ED Course  Procedures (including critical care time) DIAGNOSTIC STUDIES: Oxygen Saturation is 98% on RA, normal by my interpretation.    COORDINATION OF CARE: 3:29 AM- Pt advised of plan for treatment and pt agrees. Pt will receive a CT scan of abdomen and lab work for further evaluation. He will also receive Zantac, Carafate, Mylanta, and Morphine IM.    Labs Review Labs Reviewed  CBC WITH DIFFERENTIAL/PLATELET - Abnormal; Notable for the following:    WBC 12.1 (*)    Neutro Abs 8.6 (*)    All other components within normal limits  COMPREHENSIVE METABOLIC PANEL - Abnormal; Notable for the following:    Sodium 133 (*)    Chloride 96 (*)    Glucose, Bld 272 (*)    Total Bilirubin 1.3 (*)    All other components within normal limits  LIPASE, BLOOD  URINALYSIS, ROUTINE W REFLEX MICROSCOPIC (NOT AT [facility])  I-STAT CG4 LACTIC ACID, ED    Imaging Review Ct Abdomen Pelvis W Contrast  10/17/2015  CLINICAL DATA:  Gradually worsening epigastric pain onset 2 nights ago. Leukocytosis and evelated bilirubin. EXAM: CT ABDOMEN AND PELVIS WITH CONTRAST TECHNIQUE: Multidetector CT imaging of the abdomen and pelvis was performed using the standard protocol following bolus administration of intravenous contrast. CONTRAST:  80 cc Omnipaque 300 intravenous COMPARISON:  12/21/2008 FINDINGS: Lower chest and abdominal wall: Fluid in the right inguinal canal is likely ascitic fluid within hernia given absence in 2010 (which would suggest an encysted hydrocele ). Cardiomegaly Hepatobiliary: Cholelithiasis with hydropic gallbladder wall thickening, pericholecystic edema, and perfusion anomaly along the gallbladder fossa. Dilated common bile duct at 11 mm without calcified choledocholithiasis. Pancreas: Unremarkable. Spleen: Unremarkable. Adrenals/Urinary Tract: Negative adrenals. No  hydronephrosis or stone. Left renal cyst. Unremarkable bladder. Reproductive:No pathologic findings. Stomach/Bowel:  No obstruction. No appendicitis. Vascular/Lymphatic: No acute vascular abnormality. 13 mm nodule posterior to the left renal artery is stable from 08/17/2012. Peritoneal: No ascites or pneumoperitoneum. Musculoskeletal: No acute abnormalities. IMPRESSION: 1. Acute cholecystitis. 2. Dilated common bile duct without visible choledocholithiasis. Electronically Signed   By: Marnee Spring M.D.   On: 10/17/2015 05:46     Tomasita Crumble, MD has personally reviewed and evaluated these images and lab results as part of her medical decision-making.   EKG Interpretation None      MDM   Final diagnoses:  None   Patient presents to the emergency department for abdominal pain. Language barrier makes it difficult to assess the details of the pain. I obtain a CT scan which reveals acute cholecystitis. Patient given Zosyn for treatment. I spoke with Dr. Sheliah Hatch with general surgery who will admit the patient for further care. Currently his pain has remained, we'll give him morphine for further control.   I personally performed the services described in this documentation, which was scribed in my presence. The recorded information has been reviewed and is accurate.     Tomasita Crumble, MD 10/17/15 [phone]

## 2015-10-17 NOTE — ED Notes (Signed)
Surgeon at [facility]. Family has left BS. Preparing to use phone translator.  Pt resting/ sleeping, NAD, calm, no changes.

## 2015-10-18 ENCOUNTER — Encounter (HOSPITAL_COMMUNITY): Payer: Self-pay | Admitting: Anesthesiology

## 2015-10-18 ENCOUNTER — Inpatient Hospital Stay (HOSPITAL_COMMUNITY): Payer: Medicare Other | Admitting: Anesthesiology

## 2015-10-18 ENCOUNTER — Encounter (HOSPITAL_COMMUNITY)
Admission: EM | Disposition: A | Discharge status: Home / Self Care | Payer: Self-pay | Source: Home / Self Care | Attending: Internal Medicine

## 2015-10-18 ENCOUNTER — Inpatient Hospital Stay (HOSPITAL_COMMUNITY): Payer: Medicare Other

## 2015-10-18 DIAGNOSIS — K219 Gastro-esophageal reflux disease without esophagitis: Secondary | ICD-10-CM

## 2015-10-18 DIAGNOSIS — I10 Essential (primary) hypertension: Secondary | ICD-10-CM

## 2015-10-18 DIAGNOSIS — K8000 Calculus of gallbladder with acute cholecystitis without obstruction: Principal | ICD-10-CM

## 2015-10-18 DIAGNOSIS — E119 Type 2 diabetes mellitus without complications: Secondary | ICD-10-CM

## 2015-10-18 DIAGNOSIS — Z9049 Acquired absence of other specified parts of digestive tract: Secondary | ICD-10-CM

## 2015-10-18 HISTORY — PX: CHOLECYSTECTOMY: SHX55

## 2015-10-18 LAB — COMPREHENSIVE METABOLIC PANEL
ALT: 48 U/L (ref 17–63)
AST: 61 U/L — AB (ref 15–41)
Albumin: 3.4 g/dL — ABNORMAL LOW (ref 3.5–5.0)
Alkaline Phosphatase: 98 U/L (ref 38–126)
Anion gap: 10 (ref 5–15)
BILIRUBIN TOTAL: 2.9 mg/dL — AB (ref 0.3–1.2)
BUN: 16 mg/dL (ref 6–20)
CO2: 27 mmol/L (ref 22–32)
CREATININE: 1.47 mg/dL — AB (ref 0.61–1.24)
Calcium: 8.8 mg/dL — ABNORMAL LOW (ref 8.9–10.3)
Chloride: 102 mmol/L (ref 101–111)
GFR calc Af Amer: 54 mL/min — ABNORMAL LOW (ref >60)
GFR, EST NON AFRICAN AMERICAN: 47 mL/min — AB (ref >60)
Glucose, Bld: 131 mg/dL — ABNORMAL HIGH (ref 65–99)
POTASSIUM: 4.2 mmol/L (ref 3.5–5.1)
Sodium: 139 mmol/L (ref 135–145)
TOTAL PROTEIN: 7.4 g/dL (ref 6.5–8.1)

## 2015-10-18 LAB — CBC
HCT: 44.2 % (ref 39.0–52.0)
Hemoglobin: 15.2 g/dL (ref 13.0–17.0)
MCH: 28.8 pg (ref 26.0–34.0)
MCHC: 34.4 g/dL (ref 30.0–36.0)
MCV: 83.9 fL (ref 78.0–100.0)
PLATELETS: 252 10*3/uL (ref 150–400)
RBC: 5.27 MIL/uL (ref 4.22–5.81)
RDW: 14 % (ref 11.5–15.5)
WBC: 19.2 10*3/uL — AB (ref 4.0–10.5)

## 2015-10-18 LAB — GLUCOSE, CAPILLARY
GLUCOSE-CAPILLARY: 120 mg/dL — AB (ref 65–99)
GLUCOSE-CAPILLARY: 136 mg/dL — AB (ref 65–99)
GLUCOSE-CAPILLARY: 152 mg/dL — AB (ref 65–99)
Glucose-Capillary: 138 mg/dL — ABNORMAL HIGH (ref 65–99)
Glucose-Capillary: 150 mg/dL — ABNORMAL HIGH (ref 65–99)

## 2015-10-18 LAB — HIV ANTIBODY (ROUTINE TESTING W REFLEX): HIV SCREEN 4TH GENERATION: NONREACTIVE

## 2015-10-18 SURGERY — LAPAROSCOPIC CHOLECYSTECTOMY WITH INTRAOPERATIVE CHOLANGIOGRAM
Anesthesia: General

## 2015-10-18 MED ORDER — HYDROMORPHONE HCL 1 MG/ML IJ SOLN
INTRAMUSCULAR | Status: AC
  Filled 2015-10-18: qty 1

## 2015-10-18 MED ORDER — 0.9 % SODIUM CHLORIDE (POUR BTL) OPTIME
TOPICAL | Status: DC | PRN
  Administered 2015-10-18: 20 mL

## 2015-10-18 MED ORDER — SODIUM CHLORIDE 0.9 % IV SOLN
10.0000 mg | INTRAVENOUS | Status: DC | PRN
  Administered 2015-10-18: 15 ug/min via INTRAVENOUS

## 2015-10-18 MED ORDER — NEOSTIGMINE METHYLSULFATE 10 MG/10ML IV SOLN
INTRAVENOUS | Status: DC | PRN
  Administered 2015-10-18: 5 mg via INTRAVENOUS

## 2015-10-18 MED ORDER — LIDOCAINE HCL (CARDIAC) 20 MG/ML IV SOLN
INTRAVENOUS | Status: DC | PRN
  Administered 2015-10-18: 40 mg via INTRAVENOUS
  Administered 2015-10-18: 60 mg via INTRAVENOUS

## 2015-10-18 MED ORDER — CYCLOBENZAPRINE HCL 10 MG PO TABS
10.0000 mg | ORAL_TABLET | Freq: Three times a day (TID) | ORAL | Status: DC | PRN
  Administered 2015-10-18: 10 mg via ORAL
  Filled 2015-10-18: qty 1

## 2015-10-18 MED ORDER — BUPIVACAINE-EPINEPHRINE 0.25% -1:200000 IJ SOLN
INTRAMUSCULAR | Status: DC | PRN
  Administered 2015-10-18: 8 mL

## 2015-10-18 MED ORDER — LIDOCAINE HCL (CARDIAC) 20 MG/ML IV SOLN
INTRAVENOUS | Status: AC
  Filled 2015-10-18: qty 5

## 2015-10-18 MED ORDER — ARTIFICIAL TEARS OP OINT
TOPICAL_OINTMENT | OPHTHALMIC | Status: DC | PRN
  Administered 2015-10-18: 1 via OPHTHALMIC

## 2015-10-18 MED ORDER — ONDANSETRON HCL 4 MG/2ML IJ SOLN
INTRAMUSCULAR | Status: DC | PRN
  Administered 2015-10-18: 4 mg via INTRAVENOUS

## 2015-10-18 MED ORDER — ROCURONIUM BROMIDE 100 MG/10ML IV SOLN
INTRAVENOUS | Status: DC | PRN
  Administered 2015-10-18: 30 mg via INTRAVENOUS

## 2015-10-18 MED ORDER — FENTANYL CITRATE (PF) 250 MCG/5ML IJ SOLN
INTRAMUSCULAR | Status: AC
  Filled 2015-10-18: qty 5

## 2015-10-18 MED ORDER — ARTIFICIAL TEARS OP OINT
TOPICAL_OINTMENT | OPHTHALMIC | Status: AC
  Filled 2015-10-18: qty 3.5

## 2015-10-18 MED ORDER — GLYCOPYRROLATE 0.2 MG/ML IJ SOLN
INTRAMUSCULAR | Status: AC
  Filled 2015-10-18: qty 3

## 2015-10-18 MED ORDER — OXYCODONE HCL 5 MG PO TABS: 10.0000 mg | ORAL_TABLET | ORAL | Status: DC | PRN

## 2015-10-18 MED ORDER — PHENYLEPHRINE HCL 10 MG/ML IJ SOLN
INTRAMUSCULAR | Status: DC | PRN
Start: 2015-10-18 — End: 2015-10-18
  Administered 2015-10-18 (×2): 80 ug via INTRAVENOUS

## 2015-10-18 MED ORDER — NEOSTIGMINE METHYLSULFATE 10 MG/10ML IV SOLN
INTRAVENOUS | Status: AC
  Filled 2015-10-18: qty 1

## 2015-10-18 MED ORDER — LACTATED RINGERS IV SOLN
INTRAVENOUS | Status: DC | PRN
  Administered 2015-10-18: 12:00:00 via INTRAVENOUS

## 2015-10-18 MED ORDER — BUPIVACAINE-EPINEPHRINE (PF) 0.25% -1:200000 IJ SOLN
INTRAMUSCULAR | Status: AC
  Filled 2015-10-18: qty 30

## 2015-10-18 MED ORDER — MIDAZOLAM HCL 2 MG/2ML IJ SOLN
INTRAMUSCULAR | Status: AC
  Filled 2015-10-18: qty 2

## 2015-10-18 MED ORDER — OXYCODONE HCL 5 MG PO TABS
10.0000 mg | ORAL_TABLET | ORAL | Status: DC | PRN
  Administered 2015-10-18 – 2015-10-19 (×3): 10 mg via ORAL
  Filled 2015-10-18 (×3): qty 2

## 2015-10-18 MED ORDER — MIDAZOLAM HCL 5 MG/5ML IJ SOLN
INTRAMUSCULAR | Status: DC | PRN
  Administered 2015-10-18: 2 mg via INTRAVENOUS

## 2015-10-18 MED ORDER — SODIUM CHLORIDE 0.9 % IR SOLN
Status: DC | PRN
  Administered 2015-10-18: 2000 mL

## 2015-10-18 MED ORDER — PROPOFOL 10 MG/ML IV BOLUS
INTRAVENOUS | Status: AC
  Filled 2015-10-18: qty 20

## 2015-10-18 MED ORDER — SUCCINYLCHOLINE CHLORIDE 20 MG/ML IJ SOLN
INTRAMUSCULAR | Status: DC | PRN
  Administered 2015-10-18: 80 mg via INTRAVENOUS

## 2015-10-18 MED ORDER — ARTIFICIAL TEARS OP OINT: TOPICAL_OINTMENT | OPHTHALMIC | Status: DC | PRN

## 2015-10-18 MED ORDER — ONDANSETRON HCL 4 MG/2ML IJ SOLN
INTRAMUSCULAR | Status: AC
  Filled 2015-10-18: qty 2

## 2015-10-18 MED ORDER — HYDROMORPHONE HCL 1 MG/ML IJ SOLN
0.2500 mg | INTRAMUSCULAR | Status: DC | PRN
  Administered 2015-10-18 (×2): 0.5 mg via INTRAVENOUS

## 2015-10-18 MED ORDER — GLYCOPYRROLATE 0.2 MG/ML IJ SOLN
INTRAMUSCULAR | Status: DC | PRN
  Administered 2015-10-18: 0.6 mg via INTRAVENOUS

## 2015-10-18 MED ORDER — PROPOFOL 10 MG/ML IV BOLUS
INTRAVENOUS | Status: DC | PRN
  Administered 2015-10-18: 100 mg via INTRAVENOUS
  Administered 2015-10-18: 20 mg via INTRAVENOUS

## 2015-10-18 MED ORDER — SODIUM CHLORIDE 0.9 % IV SOLN
INTRAVENOUS | Status: DC | PRN
  Administered 2015-10-18: 14 mL

## 2015-10-18 MED ORDER — HEPARIN SODIUM (PORCINE) 5000 UNIT/ML IJ SOLN
5000.0000 [IU] | Freq: Three times a day (TID) | INTRAMUSCULAR | Status: AC
  Administered 2015-10-18 – 2015-10-19 (×2): 5000 [IU] via SUBCUTANEOUS
  Filled 2015-10-18: qty 1

## 2015-10-18 MED ORDER — HEMOSTATIC AGENTS (NO CHARGE) OPTIME
TOPICAL | Status: DC | PRN
  Administered 2015-10-18: 1 via TOPICAL

## 2015-10-18 MED ORDER — FENTANYL CITRATE (PF) 100 MCG/2ML IJ SOLN
INTRAMUSCULAR | Status: DC | PRN
  Administered 2015-10-18: 100 ug via INTRAVENOUS
  Administered 2015-10-18: 50 ug via INTRAVENOUS
  Administered 2015-10-18: 100 ug via INTRAVENOUS

## 2015-10-18 SURGICAL SUPPLY — 43 items
APPLIER CLIP ROT 10 11.4 M/L (STAPLE) ×3
BANDAGE ADH SHEER 1  50/CT (GAUZE/BANDAGES/DRESSINGS) ×3 IMPLANT
BENZOIN TINCTURE PRP APPL 2/3 (GAUZE/BANDAGES/DRESSINGS) ×3 IMPLANT
BLADE SURG ROTATE 9660 (MISCELLANEOUS) IMPLANT
CANISTER SUCTION 2500CC (MISCELLANEOUS) ×3 IMPLANT
CHLORAPREP W/TINT 26ML (MISCELLANEOUS) ×3 IMPLANT
CLIP APPLIE ROT 10 11.4 M/L (STAPLE) ×1 IMPLANT
CLOSURE STERI-STRIP 1/2X4 (GAUZE/BANDAGES/DRESSINGS) ×1
CLOSURE WOUND 1/2 X4 (GAUZE/BANDAGES/DRESSINGS) ×1
CLSR STERI-STRIP ANTIMIC 1/2X4 (GAUZE/BANDAGES/DRESSINGS) ×2 IMPLANT
COVER MAYO STAND STRL (DRAPES) ×3 IMPLANT
COVER SURGICAL LIGHT HANDLE (MISCELLANEOUS) ×3 IMPLANT
DEVICE TROCAR PUNCTURE CLOSURE (ENDOMECHANICALS) IMPLANT
DRAPE C-ARM 42X72 X-RAY (DRAPES) ×3 IMPLANT
DRSG TEGADERM 2-3/8X2-3/4 SM (GAUZE/BANDAGES/DRESSINGS) ×3 IMPLANT
ELECT REM PT RETURN 9FT ADLT (ELECTROSURGICAL) ×3
ELECTRODE REM PT RTRN 9FT ADLT (ELECTROSURGICAL) ×1 IMPLANT
GLOVE BIOGEL PI IND STRL 7.0 (GLOVE) ×1 IMPLANT
GLOVE BIOGEL PI INDICATOR 7.0 (GLOVE) ×2
GLOVE SURG SS PI 7.0 STRL IVOR (GLOVE) ×3 IMPLANT
GOWN STRL REUS W/ TWL LRG LVL3 (GOWN DISPOSABLE) ×3 IMPLANT
GOWN STRL REUS W/TWL LRG LVL3 (GOWN DISPOSABLE) ×6
GRASPER SUT TROCAR 14GX15 (MISCELLANEOUS) ×3 IMPLANT
KIT BASIN OR (CUSTOM PROCEDURE TRAY) ×3 IMPLANT
KIT ROOM TURNOVER OR (KITS) ×3 IMPLANT
NS IRRIG 1000ML POUR BTL (IV SOLUTION) ×3 IMPLANT
PAD ARMBOARD 7.5X6 YLW CONV (MISCELLANEOUS) ×3 IMPLANT
POUCH RETRIEVAL ECOSAC 10 (ENDOMECHANICALS) ×1 IMPLANT
POUCH RETRIEVAL ECOSAC 10MM (ENDOMECHANICALS) ×2
SCISSORS LAP 5X35 DISP (ENDOMECHANICALS) ×3 IMPLANT
SET CHOLANGIOGRAPH 5 50 .035 (SET/KITS/TRAYS/PACK) ×3 IMPLANT
SET IRRIG TUBING LAPAROSCOPIC (IRRIGATION / IRRIGATOR) ×3 IMPLANT
SLEEVE ENDOPATH XCEL 5M (ENDOMECHANICALS) ×6 IMPLANT
SPECIMEN JAR SMALL (MISCELLANEOUS) ×3 IMPLANT
SPONGE GAUZE 4X4 12PLY STER LF (GAUZE/BANDAGES/DRESSINGS) ×3 IMPLANT
STRIP CLOSURE SKIN 1/2X4 (GAUZE/BANDAGES/DRESSINGS) ×2 IMPLANT
SUT MNCRL AB 4-0 PS2 18 (SUTURE) ×3 IMPLANT
TOWEL OR 17X24 6PK STRL BLUE (TOWEL DISPOSABLE) ×3 IMPLANT
TOWEL OR 17X26 10 PK STRL BLUE (TOWEL DISPOSABLE) ×3 IMPLANT
TRAY LAPAROSCOPIC MC (CUSTOM PROCEDURE TRAY) ×3 IMPLANT
TROCAR XCEL 12X100 BLDLESS (ENDOMECHANICALS) ×3 IMPLANT
TROCAR XCEL NON-BLD 5MMX100MML (ENDOMECHANICALS) ×3 IMPLANT
TUBING INSUFFLATION (TUBING) ×3 IMPLANT

## 2015-10-18 NOTE — Transfer of Care (Signed)
Immediate Anesthesia Transfer of Care Note  Patient: Francisco [name]  Procedure(s) Performed: Procedure(s): LAPAROSCOPIC CHOLECYSTECTOMY WITH INTRAOPERATIVE CHOLANGIOGRAM (N/A)  Patient Location: PACU  Anesthesia Type:General  Level of Consciousness: awake, oriented, sedated, patient cooperative and responds to stimulation  Airway & Oxygen Therapy: Patient Spontanous Breathing and Patient connected to nasal cannula oxygen  Post-op Assessment: Report given to RN, Post -op Vital signs reviewed and stable, Patient moving all extremities and Patient moving all extremities X 4  Post vital signs: Reviewed and stable  Last Vitals:  Filed Vitals:   10/17/15 2232 10/18/15 0637  BP: 105/71 95/70  Pulse: 100 98  Temp: 37.6 C 37.6 C  Resp: 19 18    Complications: No apparent anesthesia complications

## 2015-10-18 NOTE — Op Note (Signed)
Preoperative diagnosis: acute cholecystitis  Postoperative diagnosis: Same   Procedure: laparoscopic cholecystectomy with intraoperative cholangiogram  Surgeon: Feliciana Rossetti, M.D.  Asst: none  Anesthesia: Gen.   Indications for procedure: Francisco [name] is a 70 y.o. male with symptoms of Abdominal pain and RUQ pain consistent with gallbladder disease, Confirmed by CT.  Description of procedure: The patient was brought into the operative suite, placed supine. Anesthesia was administered with endotracheal tube. Patient was strapped in place and foot board was secured. All pressure points were offloaded by foam padding. The patient was prepped and draped in the usual sterile fashion.  A small incision was made to the right of the umbilicus. A 5mm trocar was inserted into the peritoneal cavity with optical entry. Pneumoperitoneum was applied with high flow low pressure. 2 5mm trocars were placed in the RUQ. A 12mm trocar was placed in the subxiphoid space. All trocars sites were first anesthesized with 0.25% marcaine with epinephrine in the subcutaneous and preperitoneal layers. Next the patient was placed in reverse trendelenberg. The gallbladder was retracted cephalad and lateral. The peritoneum was reflected off the infundibulum working lateral to medial.   The cystic duct and cystic artery were identified and further dissection revealed a critical view, due to concern for choledocholithiasis a cholangiogram was performed with cook catheter. The CBD was dilated but no opacity was seen. Both right and left hepatic ducts were seen. The cystic duct and cystic artery were doubly clipped and ligated.   The gallbladder was removed off the liver bed with cautery. The Gallbladder was placed in a specimen bag. The gallbladder fossa was irrigated and hemostasis was applied with cautery. The gallbladder was removed via the 12mm trocar. The fascia was extensively opened due to large stone Pneumoperitoneum was  removed, all trocar were removed. All incisions were closed with 4-0 monocryl subcuticular stitch. The patient woke from anesthesia and was brought to PACU in stable condition.  Findings: acute cholecystitis, normal ductal anatomy with some dilation  Specimen: gallbldadder  Blood loss: 100cc  Local anesthesia: 20cc 0.25% marcaine w epi  Complications: none  Feliciana Rossetti, M.D. General, Bariatric, & Minimally Invasive Surgery [facility]

## 2015-10-18 NOTE — Progress Notes (Signed)
Second chg bath completed.

## 2015-10-18 NOTE — Progress Notes (Signed)
Progress Note: General Surgery Service   Subjective: Pain improved  Objective: Vital signs in last 24 hours: Temp:  [99.6 F (37.6 C)-102.6 F (39.2 C)] 99.7 F (37.6 C) (02/05 0637) Pulse Rate:  [97-120] 98 (02/05 0637) Resp:  [18-24] 18 (02/05 0637) BP: (95-185)/(8-115) 95/70 mmHg (02/05 0637) SpO2:  [93 %-97 %] 94 % (02/05 0637) Weight:  [72.8 kg (160 lb 7.9 oz)] 72.8 kg (160 lb 7.9 oz) (02/04 1857) Last BM Date: 10/16/15  Intake/Output from previous day: 02/04 0701 - 02/05 0700 In: 0  Out: 550 [Urine:550] Intake/Output this shift:    Lungs: CTAB  Cardiovascular: RRR  Abd: soft, TTP RUQ, ND  Extremities: no edema  Neuro: AOx4  Lab Results: CBC   Recent Labs  10/17/15 0335 10/18/15 0525  WBC 12.1* 19.2*  HGB 15.7 15.2  HCT 45.3 44.2  PLT 272 252   BMET  Recent Labs  10/17/15 0335 10/18/15 0525  NA 133* 139  K 4.0 4.2  CL 96* 102  CO2 22 27  GLUCOSE 272* 131*  BUN 9 16  CREATININE 0.88 1.47*  CALCIUM 9.8 8.8*   PT/INR No results for input(s): LABPROT, INR in the last 72 hours. ABG No results for input(s): PHART, HCO3 in the last 72 hours.  Invalid input(s): PCO2, PO2  Studies/Results:  Anti-infectives: Anti-infectives    Start     Dose/Rate Route Frequency Ordered Stop   10/17/15 1400  piperacillin-tazobactam (ZOSYN) IVPB 3.375 g     3.375 g 12.5 mL/hr over 240 Minutes Intravenous 3 times per day 10/17/15 1334     10/17/15 0600  piperacillin-tazobactam (ZOSYN) IVPB 3.375 g     3.375 g 100 mL/hr over 30 Minutes Intravenous  Once 10/17/15 0553 10/17/15 0743      Medications: Scheduled Meds: . Influenza vac split quadrivalent PF  0.5 mL Intramuscular Tomorrow-1000  . insulin aspart  0-9 Units Subcutaneous 6 times per day  . lisinopril  10 mg Oral Daily  . pantoprazole  40 mg Oral Daily  . piperacillin-tazobactam (ZOSYN)  IV  3.375 g Intravenous 3 times per day  . pneumococcal 23 valent vaccine  0.5 mL Intramuscular  Tomorrow-1000   Continuous Infusions: . sodium chloride 100 mL/hr at 10/17/15 2026   PRN Meds:.acetaminophen, morphine injection, ondansetron **OR** ondansetron (ZOFRAN) IV  Assessment/Plan: Patient Active Problem List   Diagnosis Date Noted  . Acute cholecystitis 10/17/2015  . Hypertension 10/17/2015  . Type 2 diabetes mellitus (HCC) 10/17/2015  . Cholelithiasis and acute cholecystitis without obstruction 10/17/2015  . SHOULDER PAIN, LEFT 10/07/2010  . ELEVATED BLOOD PRESSURE WITHOUT DIAGNOSIS OF HYPERTENSION 10/07/2010  . BACK PAIN 07/26/2010  . TOBACCO ABUSE 10/01/2008  . GERD 05/08/2008  . HAND INJURY 05/08/2008  . 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 BURSITIS, RIGHT SHOULDER 10/26/2007  . SCOLIOSIS, THORACOLUMBAR 10/26/2007   s/p  * No surgery found *  Acute cholecystitis, WBC increasing and LFTs up -OR today now that BP is improved. I discussed with the patient via a phone interrupter the details of the procedure, that we will attempt laparoscopic approach with 4 incisions, that we will remove the gallbladder away from the liver, identify the CBD and be sure not to injure the bile duct. We discussed risks of the procedure being liver injury, bleeding, bile duct injury, infection, cystic duct leak, and post cholecystectomy syndrome. All questions were answers, the patient showed good understanding. -NPO -continue abx   LOS: 1 day   Rodman Pickle, MD  Pg# [phone] [facility]

## 2015-10-18 NOTE — Anesthesia Postprocedure Evaluation (Signed)
Anesthesia Post Note  Patient: Francisco [name]  Procedure(s) Performed: Procedure(s) (LRB): LAPAROSCOPIC CHOLECYSTECTOMY WITH INTRAOPERATIVE CHOLANGIOGRAM (N/A)  Patient location during evaluation: PACU Anesthesia Type: General Level of consciousness: awake and alert Pain management: pain level controlled Vital Signs Assessment: post-procedure vital signs reviewed and stable Respiratory status: spontaneous breathing, nonlabored ventilation, respiratory function stable and patient connected to nasal cannula oxygen Cardiovascular status: blood pressure returned to baseline and stable Postop Assessment: no signs of nausea or vomiting Anesthetic complications: no    Last Vitals:  Filed Vitals:   10/18/15 1424 10/18/15 1438  BP: 121/96 123/88  Pulse: 103 102  Temp:  37.3 C  Resp: 17 17    Last Pain:  Filed Vitals:   10/18/15 1442  PainSc: 0-No pain                 Miata Culbreth,W. EDMOND

## 2015-10-18 NOTE — Anesthesia Procedure Notes (Signed)
Procedure Name: Intubation Date/Time: 10/18/2015 11:47 AM Performed by: Wray Kearns A Pre-anesthesia Checklist: Patient identified, Timeout performed, Emergency Drugs available, Suction available and Patient being monitored Patient Re-evaluated:Patient Re-evaluated prior to inductionOxygen Delivery Method: Circle system utilized Preoxygenation: Pre-oxygenation with 100% oxygen Intubation Type: IV induction and Cricoid Pressure applied Ventilation: Mask ventilation without difficulty Laryngoscope Size: Mac and 4 Grade View: Grade I Tube type: Oral Tube size: 7.5 mm Number of attempts: 1 Airway Equipment and Method: Stylet Placement Confirmation: ETT inserted through vocal cords under direct vision,  breath sounds checked- equal and bilateral and positive ETCO2 Secured at: 23 cm Tube secured with: Tape Dental Injury: Teeth and Oropharynx as per pre-operative assessment

## 2015-10-18 NOTE — Progress Notes (Signed)
Subjective:  Patient seen and examined at bedside. No acute events overnight. Patient's son not present at bedside- info translated in Hindi by me- Pt understood what I said and had no further questions.  He had fever to 102.6 last night but afebrile in AM. He says his abdominal pain is less, denies fevers, or shortness of breath. He knows that surgery is scheduled for late this morning. Denies n/v/d/c. Know that we are concerned for infection so receiving antibiotics        Objective: Vital signs in last 24 hours: Filed Vitals:   10/17/15 1857 10/17/15 2053 10/17/15 2232 10/18/15 0637  BP: 138/8  105/71 95/70  Pulse: 120  100 98  Temp: 102.6 F (39.2 C) 100.7 F (38.2 C) 99.6 F (37.6 C) 99.7 F (37.6 C)  TempSrc: Oral Oral Oral Oral  Resp: Height:  (1.6 m)     Weight: 160 lb 7.9 oz (72.8 kg)     SpO2: 95%  95% 94%   Weight change:   Intake/Output Summary (Last 24 hours) at 10/18/15 1038 Last data filed at 10/18/15 1610  Gross per 24 hour  Intake      0 ml  Output    550 ml  Net   -550 ml   General: Vital signs reviewed. Patient in no acute distress Cardiovascular: regular rate, rhythm, no murmur appreciated  Pulmonary/Chest: Clear to auscultation bilaterally, no wheezes, or crackles  Abdominal: Soft, non-tender, non-distended, BS +  Extremities: No lower extremity edema bilaterally, pulses symmetric and intact bilaterally. Skin: Warm, dry and intact. No rashes or erythema.    Lab Results: Results for orders placed or performed during the hospital encounter of 10/17/15 (from the past 24 hour(s))  HIV antibody     Status: None   Collection Time: 10/17/15  1:55 PM  Result Value Ref Range   HIV Screen 4th Generation wRfx Non Reactive Non Reactive  Glucose, capillary     Status: Abnormal   Collection Time: 10/17/15  4:26 PM  Result Value Ref Range   Glucose-Capillary 220 (H) 65 - 99 mg/dL   Comment 1 Notify RN   Glucose, capillary      Status: Abnormal   Collection Time: 10/17/15  8:03 PM  Result Value Ref Range   Glucose-Capillary 193 (H) 65 - 99 mg/dL   Comment 1 Notify RN   Surgical pcr screen     Status: Abnormal   Collection Time: 10/17/15  8:55 PM  Result Value Ref Range   MRSA, PCR NEGATIVE NEGATIVE   Staphylococcus aureus POSITIVE (A) NEGATIVE  Glucose, capillary     Status: Abnormal   Collection Time: 10/17/15 11:43 PM  Result Value Ref Range   Glucose-Capillary 177 (H) 65 - 99 mg/dL   Comment 1 Notify RN   Glucose, capillary     Status: Abnormal   Collection Time: 10/18/15  4:22 AM  Result Value Ref Range   Glucose-Capillary 138 (H) 65 - 99 mg/dL  Comprehensive metabolic panel     Status: Abnormal   Collection Time: 10/18/15  5:25 AM  Result Value Ref Range   Sodium 139 135 - 145 mmol/L   Potassium 4.2 3.5 - 5.1 mmol/L   Chloride 102 101 - 111 mmol/L   CO2 27 22 - 32 mmol/L   Glucose, Bld 131 (H) 65 - 99 mg/dL   BUN 16 6 - 20 mg/dL   Creatinine, Ser 9.60 (H) 0.61 - 1.24 mg/dL  Calcium 8.8 (L) 8.9 - 10.3 mg/dL   Total Protein 7.4 6.5 - 8.1 g/dL   Albumin 3.4 (L) 3.5 - 5.0 g/dL   AST 61 (H) 15 - 41 U/L   ALT 48 17 - 63 U/L   Alkaline Phosphatase 98 38 - 126 U/L   Total Bilirubin 2.9 (H) 0.3 - 1.2 mg/dL   GFR calc non Af Amer 47 (L) >60 mL/min   GFR calc Af Amer 54 (L) >60 mL/min   Anion gap 10 5 - 15  CBC     Status: Abnormal   Collection Time: 10/18/15  5:25 AM  Result Value Ref Range   WBC 19.2 (H) 4.0 - 10.5 K/uL   RBC 5.27 4.22 - 5.81 MIL/uL   Hemoglobin 15.2 13.0 - 17.0 g/dL   HCT 16.1 09.6 - 04.5 %   MCV 83.9 78.0 - 100.0 fL   MCH 28.8 26.0 - 34.0 pg   MCHC 34.4 30.0 - 36.0 g/dL   RDW 40.9 81.1 - 91.4 %   Platelets 252 150 - 400 K/uL  Glucose, capillary     Status: Abnormal   Collection Time: 10/18/15  7:33 AM  Result Value Ref Range   Glucose-Capillary 120 (H) 65 - 99 mg/dL   Comment 1 Notify RN     Micro Results: Recent Results (from the past 240 hour(s))  Surgical pcr  screen     Status: Abnormal   Collection Time: 10/17/15  8:55 PM  Result Value Ref Range Status   MRSA, PCR NEGATIVE NEGATIVE Final   Staphylococcus aureus POSITIVE (A) NEGATIVE Final    Comment:        The Xpert SA Assay (FDA approved for NASAL specimens in patients over 44 years of age), is one component of a comprehensive surveillance program.  Test performance has been validated by [facility] for patients greater than or equal to 36 year old. It is not intended to diagnose infection nor to guide or monitor treatment.    Studies/Results: Dg Chest 2 View  10/17/2015  CLINICAL DATA:  Acute dyspnea. EXAM: CHEST  2 VIEW COMPARISON:  August 17, 2012. FINDINGS: Stable cardiomediastinal silhouette. No pneumothorax or pleural effusion is noted. Increased bibasilar interstitial opacities are noted consistent with subsegmental atelectasis. Bony thorax is unremarkable. IMPRESSION: Increased bibasilar subsegmental atelectasis. Electronically Signed   By: Lupita Raider, M.D.   On: 10/17/2015 14:35   Ct Abdomen Pelvis W Contrast  10/17/2015  CLINICAL DATA:  Gradually worsening epigastric pain onset 2 nights ago. Leukocytosis and evelated bilirubin. EXAM: CT ABDOMEN AND PELVIS WITH CONTRAST TECHNIQUE: Multidetector CT imaging of the abdomen and pelvis was performed using the standard protocol following bolus administration of intravenous contrast. CONTRAST:  80 cc Omnipaque 300 intravenous COMPARISON:  12/21/2008 FINDINGS: Lower chest and abdominal wall: Fluid in the right inguinal canal is likely ascitic fluid within hernia given absence in 2010 (which would suggest an encysted hydrocele ). Cardiomegaly Hepatobiliary: Cholelithiasis with hydropic gallbladder wall thickening, pericholecystic edema, and perfusion anomaly along the gallbladder fossa. Dilated common bile duct at 11 mm without calcified choledocholithiasis. Pancreas: Unremarkable. Spleen: Unremarkable. Adrenals/Urinary Tract: Negative  adrenals. No hydronephrosis or stone. Left renal cyst. Unremarkable bladder. Reproductive:No pathologic findings. Stomach/Bowel:  No obstruction. No appendicitis. Vascular/Lymphatic: No acute vascular abnormality. 13 mm nodule posterior to the left renal artery is stable from 08/17/2012. Peritoneal: No ascites or pneumoperitoneum. Musculoskeletal: No acute abnormalities. IMPRESSION: 1. Acute cholecystitis. 2. Dilated common bile duct without visible choledocholithiasis. Electronically Signed  By: Marnee Spring M.D.   On: 10/17/2015 05:46   Medications: I have reviewed the patient's current medications. Scheduled Meds: . Influenza vac split quadrivalent PF  0.5 mL Intramuscular Tomorrow-1000  . insulin aspart  0-9 Units Subcutaneous 6 times per day  . pantoprazole  40 mg Oral Daily  . piperacillin-tazobactam (ZOSYN)  IV  3.375 g Intravenous 3 times per day  . pneumococcal 23 valent vaccine  0.5 mL Intramuscular Tomorrow-1000   Continuous Infusions: . sodium chloride 150 mL (10/18/15 1022)   PRN Meds:.acetaminophen, morphine injection, ondansetron **OR** ondansetron (ZOFRAN) IV Assessment/Plan: Principal Problem:   Acute cholecystitis Active Problems:   GERD   Hypertension   Type 2 diabetes mellitus (HCC)   Cholelithiasis and acute cholecystitis without obstruction  Acute cholecystitis with cholelithiasis: Pt with 1 day history of sharp non radiating 10/10 epigastric abd pain and no other symptoms who on CT was found to have cholelithiasis and gallbladder wall thickening And 11mm dilated common bile duct. He has history of pSBO in 2013 that was resolved with IV hydration and bowel rest. CT abd in 2013 did show 2 cm gallstone and lipase was mildly elevated in the past to 68. Overnight, patient had fever to 102.6, but zosyn was started in the ER and blood cultures pending. Pt's WC had gone up to 19 from 12, and creatinine to 1.47 from 0.8. Also liver enzymes slightly up and TBili to 2.9. He  is MRSA negative but Staph aureus positive per surgical PCR. Hiv neg.  -surgery today -NPO -zosyn per pharm -NS 150 cc/hr -morphine 2 mg q4 hours PRN -zofran 4 mg q6 PRN -CBC and CMET in AM  HTN: Blood pressures have been elevated in the past per past admission notes. He is not on any meds. He was given amlodipine earlier in the ER. Lisinopril started yesterday. Today, BP on soft side. 95/70  -holding lisinopril today   GERD: The symptoms he is describing sound like GERD, but most likely it is referred pain from cholecystitis. He recd ranitidine, carafate and maalox for symptomatic relief in ER.   -protonix 40 mg daily   T2DM: Pt was started on metformin 500 mg qd 2 days ago at urgent care center. No previous follow ups. Holding metformin. Overnight cbgs have been stable. Recd 7 units of SSI novolog   -ordered A1c, pending -SSI   Dispo: Disposition is deferred at this time, awaiting improvement of current medical problems.  Anticipated discharge in approximately 2 day(s).   The patient does not have a current PCP (No primary care provider on file.) and does need an [facility] hospital follow-up appointment after discharge.  The patient does not have transportation limitations that hinder transportation to clinic appointments.  .Services Needed at time of discharge: Y = Yes, Blank = No PT:   OT:   RN:   Equipment:   Other:     LOS: 1 day   Deneise Lever, MD 10/18/2015, 10:38 AM

## 2015-10-18 NOTE — Progress Notes (Signed)
Pt temp 102.2 Dr. Sheliah Hatch on call MD informed no new order at this time will continue to monitor, just give tylenol PRN , pt on zosyn.

## 2015-10-18 NOTE — H&P (Signed)
Internal Medicine Attending Admission Note Date: 10/18/2015  Patient name: Francisco [name] Medical record number: [id number] Date of birth: 09/08/1946 Age: 70 y.o. Gender: male  I saw and evaluated the patient. I reviewed the resident's note and I agree with the resident's findings and plan as documented in the resident's note.  Chief Complaint(s): Epigastric pain 1 day.  History - key components related to admission:  Francisco [name] is a 70 year old man with a history of recently diagnosed hypertension and diabetes who presents with a one-day history of severe epigastric pain. He was in his usual state of health until the day prior to admission when he ate dinner and began experiencing epigastric pain described as sharp and nonradiating. The pain progressively worsened and he therefore presented to the emergency department. He denied any fevers, shakes, chills, changes in appetite, nausea, vomiting, diarrhea, constipation, or dysuria. In the emergency department he was found to have acute cholecystitis with a dilated common bile duct and a large gallstone within the gallbladder. He was therefore admitted to the internal medicine teaching service for medical stabilization prior to an elective laparoscopic cholecystectomy which was performed this afternoon.  Physical Exam - key components related to admission:  Filed Vitals:   10/18/15 1354 10/18/15 1409 10/18/15 1424 10/18/15 1438  BP: 131/90 118/87 121/96 123/88  Pulse: 106 101 103 102  Temp:    99.1 F (37.3 C)  TempSrc:      Resp: Height:      Weight:      SpO2: 94% 91% 95% 94%   From this morning Gen.: Well-developed, well-nourished, man lying comfortably in bed in no acute distress. Abdomen: Mild epigastric tenderness to palpation. Abdomen was soft without rebound. Bowel sounds were active.  Lab results:  Basic Metabolic Panel:  Recent Labs  84/69/62 0335 10/18/15 0525  NA 133* 139  K 4.0 4.2  CL 96* 102  CO2 22 27   GLUCOSE 272* 131*  BUN 9 16  CREATININE 0.88 1.47*  CALCIUM 9.8 8.8*   Liver Function Tests:  Recent Labs  10/17/15 0335 10/18/15 0525  AST 33 61*  ALT 28 48  ALKPHOS 91 98  BILITOT 1.3* 2.9*  PROT 7.6 7.4  ALBUMIN 4.1 3.4*    Recent Labs  10/17/15 0335  LIPASE 38   CBC:  Recent Labs  10/17/15 0335 10/18/15 0525  WBC 12.1* 19.2*  NEUTROABS 8.6*  --   HGB 15.7 15.2  HCT 45.3 44.2  MCV 83.0 83.9  PLT 272 252   CBG:  Recent Labs  10/17/15 1626 10/17/15 2003 10/17/15 2343 10/18/15 0422 10/18/15 0733 10/18/15 1418  GLUCAP 220* 193* 177* 138* 120* 136*   Urinalysis:  Clear yellow, specific gravity less than 1.005, pH 7.5, glucose 500, trace hemoglobin, negative nitrite, negative leukocytes, 0-5 white blood cells per high-power field, 0-5 red blood cells per high-power field, no bacteria.  Misc. Labs:  Lactic acid 1.59 HIV antibody nonreactive Blood culture 2 pending  Imaging results:  Dg Chest 2 View  10/17/2015  CLINICAL DATA:  Acute dyspnea. EXAM: CHEST  2 VIEW COMPARISON:  August 17, 2012. FINDINGS: Stable cardiomediastinal silhouette. No pneumothorax or pleural effusion is noted. Increased bibasilar interstitial opacities are noted consistent with subsegmental atelectasis. Bony thorax is unremarkable. IMPRESSION: Increased bibasilar subsegmental atelectasis. Electronically Signed   By: Lupita Raider, M.D.   On: 10/17/2015 14:35   Dg Cholangiogram Operative  10/18/2015  CLINICAL DATA:  Acute cholecystitis. EXAM: INTRAOPERATIVE CHOLANGIOGRAM TECHNIQUE:  Cholangiographic images from the C-arm fluoroscopic device were submitted for interpretation post-operatively. Please see the procedural report for the amount of contrast and the fluoroscopy time utilized. COMPARISON:  CT 10/17/2015 FINDINGS: Mild dilatation of the extrahepatic biliary system. Contrast fills the bile ducts and rapidly drains into the duodenum. Minor filling of the pancreatic duct. Mild  filling of the central intrahepatic ducts. No large filling defects. IMPRESSION: Patent biliary system without obstructing stones. Electronically Signed   By: Richarda Overlie M.D.   On: 10/18/2015 14:24   Ct Abdomen Pelvis W Contrast  10/17/2015  CLINICAL DATA:  Gradually worsening epigastric pain onset 2 nights ago. Leukocytosis and evelated bilirubin. EXAM: CT ABDOMEN AND PELVIS WITH CONTRAST TECHNIQUE: Multidetector CT imaging of the abdomen and pelvis was performed using the standard protocol following bolus administration of intravenous contrast. CONTRAST:  80 cc Omnipaque 300 intravenous COMPARISON:  12/21/2008 FINDINGS: Lower chest and abdominal wall: Fluid in the right inguinal canal is likely ascitic fluid within hernia given absence in 2010 (which would suggest an encysted hydrocele ). Cardiomegaly Hepatobiliary: Cholelithiasis with hydropic gallbladder wall thickening, pericholecystic edema, and perfusion anomaly along the gallbladder fossa. Dilated common bile duct at 11 mm without calcified choledocholithiasis. Pancreas: Unremarkable. Spleen: Unremarkable. Adrenals/Urinary Tract: Negative adrenals. No hydronephrosis or stone. Left renal cyst. Unremarkable bladder. Reproductive:No pathologic findings. Stomach/Bowel:  No obstruction. No appendicitis. Vascular/Lymphatic: No acute vascular abnormality. 13 mm nodule posterior to the left renal artery is stable from 08/17/2012. Peritoneal: No ascites or pneumoperitoneum. Musculoskeletal: No acute abnormalities. IMPRESSION: 1. Acute cholecystitis. 2. Dilated common bile duct without visible choledocholithiasis. Electronically Signed   By: Marnee Spring M.D.   On: 10/17/2015 05:46   Assessment & Plan by Problem:  Francisco [name] is a 70 year old man with recently diagnosed hypertension and diabetes who presents with a one-day history of epigastric pain that began after eating dinner. The pain progressively worsened and he presented to the emergency department  where he was found to have acute cholecystitis with a large gallstone and a dilated common bile duct. He underwent a laparoscopic cholecystectomy today and was found not to have evidence of choledocholithiasis.  1) Acute gallstone cholecystitis status post lap prescribe it cholecystectomy: Now that he is post-op we will monitor his blood pressure and initiate pharmacotherapy as appropriate. We will also monitor his blood glucoses and initiate therapy as appropriate. We appreciate surgery's assistance in the management of this patient.  2) Disposition: I anticipate he will be stable for discharge in the next 1-2 days and will require primary care services to manage his newly diagnosed hypertension and diabetes. He apparently is already established with Ms. Manson Passey.

## 2015-10-18 NOTE — Anesthesia Preprocedure Evaluation (Addendum)
Anesthesia Evaluation  Patient identified by MRN, date of birth, ID band Patient awake    Reviewed: Allergy & Precautions, H&P , NPO status , Patient's Chart, lab work & pertinent test results  Airway Mallampati: II  TM Distance: >3 FB Neck ROM: Full    Dental no notable dental hx. (+) Edentulous Lower, Dental Advisory Given   Pulmonary neg pulmonary ROS,    Pulmonary exam normal breath sounds clear to auscultation       Cardiovascular hypertension, negative cardio ROS   Rhythm:Regular Rate:Normal     Neuro/Psych negative neurological ROS  negative psych ROS   GI/Hepatic negative GI ROS, Neg liver ROS,   Endo/Other  diabetes, Type 2, Oral Hypoglycemic Agents  Renal/GU negative Renal ROS  negative genitourinary   Musculoskeletal  (+) Arthritis , Osteoarthritis,    Abdominal   Peds  Hematology negative hematology ROS (+)   Anesthesia Other Findings   Reproductive/Obstetrics negative OB ROS                            Anesthesia Physical Anesthesia Plan  ASA: II  Anesthesia Plan: General   Post-op Pain Management:    Induction: Intravenous  Airway Management Planned: Oral ETT  Additional Equipment:   Intra-op Plan:   Post-operative Plan: Extubation in OR  Informed Consent: I have reviewed the patients History and Physical, chart, labs and discussed the procedure including the risks, benefits and alternatives for the proposed anesthesia with the patient or authorized representative who has indicated his/her understanding and acceptance.   Dental advisory given  Plan Discussed with: CRNA  Anesthesia Plan Comments:         Anesthesia Quick Evaluation

## 2015-10-19 ENCOUNTER — Inpatient Hospital Stay (HOSPITAL_COMMUNITY): Payer: Medicare Other

## 2015-10-19 ENCOUNTER — Encounter (HOSPITAL_COMMUNITY): Payer: Self-pay | Admitting: Radiology

## 2015-10-19 DIAGNOSIS — R509 Fever, unspecified: Secondary | ICD-10-CM

## 2015-10-19 DIAGNOSIS — E118 Type 2 diabetes mellitus with unspecified complications: Secondary | ICD-10-CM

## 2015-10-19 DIAGNOSIS — R109 Unspecified abdominal pain: Secondary | ICD-10-CM

## 2015-10-19 DIAGNOSIS — J9601 Acute respiratory failure with hypoxia: Secondary | ICD-10-CM | POA: Diagnosis present

## 2015-10-19 DIAGNOSIS — R0600 Dyspnea, unspecified: Secondary | ICD-10-CM | POA: Insufficient documentation

## 2015-10-19 LAB — GLUCOSE, CAPILLARY
GLUCOSE-CAPILLARY: 119 mg/dL — AB (ref 65–99)
GLUCOSE-CAPILLARY: 159 mg/dL — AB (ref 65–99)
Glucose-Capillary: 102 mg/dL — ABNORMAL HIGH (ref 65–99)
Glucose-Capillary: 119 mg/dL — ABNORMAL HIGH (ref 65–99)
Glucose-Capillary: 129 mg/dL — ABNORMAL HIGH (ref 65–99)
Glucose-Capillary: 155 mg/dL — ABNORMAL HIGH (ref 65–99)

## 2015-10-19 LAB — BLOOD GAS, ARTERIAL
ACID-BASE DEFICIT: 0.3 mmol/L (ref 0.0–2.0)
ACID-BASE DEFICIT: 2 mmol/L (ref 0.0–2.0)
BICARBONATE: 22.9 meq/L (ref 20.0–24.0)
Bicarbonate: 24.7 mEq/L — ABNORMAL HIGH (ref 20.0–24.0)
DRAWN BY: 44166
Drawn by: 362771
FIO2: 1
O2 Content: 4 L/min
O2 SAT: 89.5 %
O2 Saturation: 82.7 %
PATIENT TEMPERATURE: 100
PCO2 ART: 46.4 mmHg — AB (ref 35.0–45.0)
PH ART: 7.345 — AB (ref 7.350–7.450)
PO2 ART: 48.9 mmHg — AB (ref 80.0–100.0)
PO2 ART: 65 mmHg — AB (ref 80.0–100.0)
Patient temperature: 98.6
TCO2: 26.1 mmol/L (ref 0–100)
TCO2: 24.3 mmol/L (ref 0–100)
pCO2 arterial: 45.7 mmHg — ABNORMAL HIGH (ref 35.0–45.0)
pH, Arterial: 7.326 — ABNORMAL LOW (ref 7.350–7.450)

## 2015-10-19 LAB — COMPREHENSIVE METABOLIC PANEL
ALBUMIN: 2.6 g/dL — AB (ref 3.5–5.0)
ALBUMIN: 2.7 g/dL — AB (ref 3.5–5.0)
ALK PHOS: 70 U/L (ref 38–126)
ALK PHOS: 72 U/L (ref 38–126)
ALT: 49 U/L (ref 17–63)
ALT: 44 U/L (ref 17–63)
AST: 65 U/L — ABNORMAL HIGH (ref 15–41)
AST: 58 U/L — AB (ref 15–41)
Anion gap: 11 (ref 5–15)
Anion gap: 14 (ref 5–15)
BILIRUBIN TOTAL: 2.2 mg/dL — AB (ref 0.3–1.2)
BUN: 21 mg/dL — ABNORMAL HIGH (ref 6–20)
BUN: 24 mg/dL — AB (ref 6–20)
CALCIUM: 8.1 mg/dL — AB (ref 8.9–10.3)
CHLORIDE: 105 mmol/L (ref 101–111)
CO2: 23 mmol/L (ref 22–32)
CO2: 23 mmol/L (ref 22–32)
CREATININE: 1.39 mg/dL — AB (ref 0.61–1.24)
Calcium: 7.9 mg/dL — ABNORMAL LOW (ref 8.9–10.3)
Chloride: 99 mmol/L — ABNORMAL LOW (ref 101–111)
Creatinine, Ser: 1.39 mg/dL — ABNORMAL HIGH (ref 0.61–1.24)
GFR calc Af Amer: 58 mL/min — ABNORMAL LOW (ref >60)
GFR calc non Af Amer: 50 mL/min — ABNORMAL LOW (ref >60)
GFR calc non Af Amer: 50 mL/min — ABNORMAL LOW (ref >60)
GFR, EST AFRICAN AMERICAN: 58 mL/min — AB (ref >60)
GLUCOSE: 133 mg/dL — AB (ref 65–99)
GLUCOSE: 196 mg/dL — AB (ref 65–99)
Potassium: 4.1 mmol/L (ref 3.5–5.1)
Potassium: 3.8 mmol/L (ref 3.5–5.1)
SODIUM: 139 mmol/L (ref 135–145)
SODIUM: 136 mmol/L (ref 135–145)
TOTAL PROTEIN: 6.2 g/dL — AB (ref 6.5–8.1)
Total Bilirubin: 2.6 mg/dL — ABNORMAL HIGH (ref 0.3–1.2)
Total Protein: 6.1 g/dL — ABNORMAL LOW (ref 6.5–8.1)

## 2015-10-19 LAB — CBC
HCT: 37.2 % — ABNORMAL LOW (ref 39.0–52.0)
HEMATOCRIT: 35.9 % — AB (ref 39.0–52.0)
HEMOGLOBIN: 12.5 g/dL — AB (ref 13.0–17.0)
HEMOGLOBIN: 11.9 g/dL — AB (ref 13.0–17.0)
MCH: 28.7 pg (ref 26.0–34.0)
MCH: 28.4 pg (ref 26.0–34.0)
MCHC: 33.6 g/dL (ref 30.0–36.0)
MCHC: 33.1 g/dL (ref 30.0–36.0)
MCV: 85.3 fL (ref 78.0–100.0)
MCV: 85.7 fL (ref 78.0–100.0)
PLATELETS: 202 10*3/uL (ref 150–400)
Platelets: 206 10*3/uL (ref 150–400)
RBC: 4.36 MIL/uL (ref 4.22–5.81)
RBC: 4.19 MIL/uL — AB (ref 4.22–5.81)
RDW: 14.4 % (ref 11.5–15.5)
RDW: 14.5 % (ref 11.5–15.5)
WBC: 13.5 10*3/uL — AB (ref 4.0–10.5)
WBC: 12.3 10*3/uL — AB (ref 4.0–10.5)

## 2015-10-19 LAB — TROPONIN I: TROPONIN I: 0.04 ng/mL — AB (ref <0.031)

## 2015-10-19 LAB — HEMOGLOBIN A1C
Hgb A1c MFr Bld: 9.5 % — ABNORMAL HIGH (ref 4.8–5.6)
Mean Plasma Glucose: 226 mg/dL

## 2015-10-19 MED ORDER — ALBUTEROL SULFATE (2.5 MG/3ML) 0.083% IN NEBU
2.5000 mg | INHALATION_SOLUTION | RESPIRATORY_TRACT | Status: DC | PRN
  Administered 2015-10-21: 2.5 mg via RESPIRATORY_TRACT
  Filled 2015-10-19: qty 3

## 2015-10-19 MED ORDER — IPRATROPIUM-ALBUTEROL 0.5-2.5 (3) MG/3ML IN SOLN
3.0000 mL | Freq: Four times a day (QID) | RESPIRATORY_TRACT | Status: DC
  Administered 2015-10-20 – 2015-10-21 (×8): 3 mL via RESPIRATORY_TRACT
  Filled 2015-10-19 (×8): qty 3

## 2015-10-19 MED ORDER — IOHEXOL 300 MG/ML  SOLN: 25.0000 mL | Freq: Once | INTRAMUSCULAR | Status: DC | PRN

## 2015-10-19 MED ORDER — INSULIN ASPART 100 UNIT/ML ~~LOC~~ SOLN
0.0000 [IU] | Freq: Three times a day (TID) | SUBCUTANEOUS | Status: DC
  Administered 2015-10-19: 2 [IU] via SUBCUTANEOUS

## 2015-10-19 MED ORDER — MORPHINE SULFATE (PF) 2 MG/ML IV SOLN: 2.0000 mg | INTRAVENOUS | Status: DC | PRN

## 2015-10-19 MED ORDER — HEPARIN SODIUM (PORCINE) 5000 UNIT/ML IJ SOLN
5000.0000 [IU] | Freq: Three times a day (TID) | INTRAMUSCULAR | Status: DC
  Administered 2015-10-19 – 2015-10-26 (×20): 5000 [IU] via SUBCUTANEOUS
  Filled 2015-10-19 (×21): qty 1

## 2015-10-19 MED ORDER — IOHEXOL 350 MG/ML SOLN
100.0000 mL | Freq: Once | INTRAVENOUS | Status: AC | PRN
  Administered 2015-10-19: 75 mL via INTRAVENOUS

## 2015-10-19 MED ORDER — INSULIN ASPART 100 UNIT/ML ~~LOC~~ SOLN
0.0000 [IU] | SUBCUTANEOUS | Status: DC
  Administered 2015-10-20: 2 [IU] via SUBCUTANEOUS
  Administered 2015-10-20: 3 [IU] via SUBCUTANEOUS
  Administered 2015-10-20: 2 [IU] via SUBCUTANEOUS
  Administered 2015-10-20 – 2015-10-21 (×3): 3 [IU] via SUBCUTANEOUS
  Administered 2015-10-21: 2 [IU] via SUBCUTANEOUS
  Administered 2015-10-21 (×2): 3 [IU] via SUBCUTANEOUS
  Administered 2015-10-22 (×2): 2 [IU] via SUBCUTANEOUS

## 2015-10-19 MED ORDER — HEPARIN SODIUM (PORCINE) 5000 UNIT/ML IJ SOLN
5000.0000 [IU] | Freq: Three times a day (TID) | INTRAMUSCULAR | Status: DC
  Administered 2015-10-19: 5000 [IU] via SUBCUTANEOUS
  Filled 2015-10-19: qty 1

## 2015-10-19 MED ORDER — INSULIN ASPART 100 UNIT/ML ~~LOC~~ SOLN: 0.0000 [IU] | Freq: Every day | SUBCUTANEOUS | Status: DC

## 2015-10-19 MED ORDER — SODIUM CHLORIDE 0.9 % IV BOLUS (SEPSIS)
500.0000 mL | Freq: Once | INTRAVENOUS | Status: AC
  Administered 2015-10-19: 500 mL via INTRAVENOUS

## 2015-10-19 MED ORDER — PIPERACILLIN-TAZOBACTAM 3.375 G IVPB
3.3750 g | Freq: Three times a day (TID) | INTRAVENOUS | Status: DC
  Administered 2015-10-19 – 2015-10-22 (×9): 3.375 g via INTRAVENOUS
  Filled 2015-10-19 (×12): qty 50

## 2015-10-19 MED ORDER — PANTOPRAZOLE SODIUM 40 MG IV SOLR
40.0000 mg | INTRAVENOUS | Status: DC
  Administered 2015-10-20 – 2015-10-25 (×6): 40 mg via INTRAVENOUS
  Filled 2015-10-19 (×7): qty 40

## 2015-10-19 MED ORDER — SODIUM CHLORIDE 0.9 % IV SOLN
INTRAVENOUS | Status: DC
  Administered 2015-10-19: 23:00:00 via INTRAVENOUS

## 2015-10-19 MED ORDER — ALBUTEROL SULFATE (2.5 MG/3ML) 0.083% IN NEBU
5.0000 mg | INHALATION_SOLUTION | Freq: Once | RESPIRATORY_TRACT | Status: AC
  Administered 2015-10-19: 5 mg via RESPIRATORY_TRACT
  Filled 2015-10-19: qty 6

## 2015-10-19 NOTE — Progress Notes (Signed)
PROGRESS NOTE:  I was contacted by nursing at 4 pm for change in patient's condition. Patient was reported to be altered, less responsive, moaning. I went promptly to evaluate the patient and saw him rocking bed, moaning and complaining of vague diffuse pain-head, chest, abdomen, legs. Despite having translators, patient was unable to provide a good history of what exactly was occuring. Per family, patient was fine until around 2 pm after eating lunch and became progressively worse throughout the day.   Physical Exam: Filed Vitals:   10/19/15 1340 10/19/15 1540 10/19/15 1828 10/19/15 1829  BP: 122/64 168/93 126/72   Pulse: 102 108 103   Temp: 99.8 F (37.7 C) 99 F (37.2 C) 100 F (37.8 C)   TempSrc: Oral Oral Oral   Resp: Height:      Weight:      SpO2: 94% 100% 89% 91%   General: Vital signs reviewed.  Patient is in visible acute distress rocking back and forth and moaning.  Neck: Distended neck veins, gurgling noises, upper respiratory wheezes.  Cardiovascular: Tachycardic, regular rhythm.  Pulmonary/Chest: Upper respiratory wheezes, but mild crackles in bases, no wheezes in lung fields. Accessory muscle use, splinting.  Abdominal: Soft, tender, mildy distended, tympanic, tense initially and later soft on examination. BS + Extremities: No lower extremity edema bilaterally Neurological: A&A, non-focal.  Assessment and Plan:  Acute Respiratory Distress and Mental Status Change: Patient has distended neck veins, but no muffled heart sounds or hypotension. Likely due to increased abdominal pressure. He appears to be splinting and using accessory muscle use to breath. Discussed with surgery who agreed with the below plan. However, on re-evaluation this appears to be an acute upper respiratory or lung issue. He was on DVT ppx so PE unlikely, but in the setting of hypoxia now on 4L satting 92%, tachycardia, dyspnea and chest pain, we will obtain CTA Chest to r/o PE and further  evaluate pulmonary process. It does not appear to be due to medication effect as he tolerated oxycodone earlier in the day. Not ACS as below. Doubt acute intra-abdominal bleed.  -Stat CBC/CMET>> unremarkable -Stat CXR>>Low lung volumes, bibasilar atelectasis and possible small effusions. -Stat Abdominal XR>>Mild diffuse gaseous distention of bowel, possibly mild ileus. Prior cholecystectomy. -Troponin 0.04; EKG sinus tachycardia without ischemic changes -Stat ABG>> pending -Albuterol nebulizer treatment -Respiratory involvement for suctioning secretions -CTA Chest to r/o PE -NPO -IVF 500 cc bolus, followed by 75 cc/hr -Night float to Eyeball   Jill Alexanders, DO PGY-2 Internal Medicine Resident Pager # [phone] 10/19/2015 8:38 PM

## 2015-10-19 NOTE — Progress Notes (Signed)
ABG collected  

## 2015-10-19 NOTE — Progress Notes (Signed)
Received pt's ABG result ph 7.34. CO2 46, pO2 48, bicarb 26 sat 82% on 4L of O2 via [place], called on call MD with order to put pt on non rebreather mask

## 2015-10-19 NOTE — Progress Notes (Signed)
MD made aware of the bladder scan result. Will do in and out cath per MD.

## 2015-10-19 NOTE — Progress Notes (Signed)
MD made aware of patient vital signs. O2 sat 88-89  oxygen.No new order at this time. Increased O2 at 4LPM

## 2015-10-19 NOTE — Progress Notes (Addendum)
Patient was noted to have markedly increased WOB and decreased LOC compared to his reported condition around 2pm this afternoon. Accessory muscle recruitment and very prolonged expiratory phase and sounding like prominent secretions. Obtained ABG showing hypoxia and increased supplemental oxygen from [place] to NRB. Patient sent to CTA showing large L>R atelectasis vs pneumonia, concerning for aspiration due to his esophagus full of fluid, ate lunch today, not able to clear secretions for [place] at bedside. Repeat ABG obtained after return from CTA on NRB showing continued hypoxia on 100% FiO2.  Discussed case with Critical Care medicine service with transfer to ICU for observation and possible intubation given worsening oxygenation and does not appear to be protecting airway effectively.

## 2015-10-19 NOTE — Progress Notes (Signed)
CCM aware of ABG values of poor oxygenation.

## 2015-10-19 NOTE — Progress Notes (Signed)
[facility] Progress Note  1 Day Post-Op  Subjective: Pt doing well, minor pain but well controlled.  Ambulating well.  No N/V, tolerated clears this am.  Passing flatus.  No BM yet.  Son at bedside.  Objective: Vital signs in last 24 hours: Temp:  [98 F (36.7 C)-102.2 F (39 C)] 101.5 F (38.6 C) (02/06 0508) Pulse Rate:  [101-113] 111 (02/06 0508) Resp:  [16-20] 20 (02/06 0508) BP: (109-134)/(67-96) 117/75 mmHg (02/06 0508) SpO2:  [91 %-98 %] 94 % (02/06 0508) Last BM Date: 10/16/15  Intake/Output from previous day: 02/05 0701 - 02/06 0700 In: 1170 [P.O.:120; I.V.:1000; IV Piggyback:50] Out: 625 [Urine:550; Blood:75] Intake/Output this shift:    PE: Gen:  Alert, NAD, pleasant Abd: Soft, distended, tender over incisions, +BS, no HSM, incisions C/D/I   Lab Results:   Recent Labs  10/18/15 0525 10/19/15 0553  WBC 19.2* 13.5*  HGB 15.2 12.5*  HCT 44.2 37.2*  PLT 252 202   BMET  Recent Labs  10/18/15 0525 10/19/15 0553  NA 139 139  K 4.2 4.1  CL 102 105  CO2 27 23  GLUCOSE 131* 133*  BUN 16 21*  CREATININE 1.47* 1.39*  CALCIUM 8.8* 7.9*   PT/INR No results for input(s): LABPROT, INR in the last 72 hours. CMP     Component Value Date/Time   NA 139 10/19/2015 0553   K 4.1 10/19/2015 0553   CL 105 10/19/2015 0553   CO2 23 10/19/2015 0553   GLUCOSE 133* 10/19/2015 0553   BUN 21* 10/19/2015 0553   CREATININE 1.39* 10/19/2015 0553   CALCIUM 7.9* 10/19/2015 0553   PROT 6.1* 10/19/2015 0553   ALBUMIN 2.6* 10/19/2015 0553   AST 65* 10/19/2015 0553   ALT 49 10/19/2015 0553   ALKPHOS 70 10/19/2015 0553   BILITOT 2.6* 10/19/2015 0553   GFRNONAA 50* 10/19/2015 0553   GFRAA 58* 10/19/2015 0553   Lipase     Component Value Date/Time   LIPASE 38 10/17/2015 0335       Studies/Results: Dg Chest 2 View  10/17/2015  CLINICAL DATA:  Acute dyspnea. EXAM: CHEST  2 VIEW COMPARISON:  August 17, 2012. FINDINGS: Stable cardiomediastinal  silhouette. No pneumothorax or pleural effusion is noted. Increased bibasilar interstitial opacities are noted consistent with subsegmental atelectasis. Bony thorax is unremarkable. IMPRESSION: Increased bibasilar subsegmental atelectasis. Electronically Signed   By: Lupita Raider, M.D.   On: 10/17/2015 14:35   Dg Cholangiogram Operative  10/18/2015  CLINICAL DATA:  Acute cholecystitis. EXAM: INTRAOPERATIVE CHOLANGIOGRAM TECHNIQUE: Cholangiographic images from the C-arm fluoroscopic device were submitted for interpretation post-operatively. Please see the procedural report for the amount of contrast and the fluoroscopy time utilized. COMPARISON:  CT 10/17/2015 FINDINGS: Mild dilatation of the extrahepatic biliary system. Contrast fills the bile ducts and rapidly drains into the duodenum. Minor filling of the pancreatic duct. Mild filling of the central intrahepatic ducts. No large filling defects. IMPRESSION: Patent biliary system without obstructing stones. Electronically Signed   By: Richarda Overlie M.D.   On: 10/18/2015 14:24    Anti-infectives: Anti-infectives    Start     Dose/Rate Route Frequency Ordered Stop   10/17/15 1400  piperacillin-tazobactam (ZOSYN) IVPB 3.375 g     3.375 g 12.5 mL/hr over 240 Minutes Intravenous 3 times per day 10/17/15 1334     10/17/15 0600  piperacillin-tazobactam (ZOSYN) IVPB 3.375 g     3.375 g 100 mL/hr over 30 Minutes Intravenous  Once 10/17/15 0553 10/17/15 0743  Assessment/Plan Acute cholecystitis POD #1 s/p lap chole with NEG IOC -Almost 3g drop in Hgb, recheck tomorrow -WBC improving -Ambulate and IS -SCD's and heparin -Tolerated clears, advance to soft diet, IVF, pain control -Zosyn Day #3 -Encouraged oral pain meds Disp - await stabilization/improvement in Hgb prior to discharge  Talked with the patient and his son who translated for me.    LOS: 2 days    Nonie Hoyer 10/19/2015, 8:23 AM Pager: [phone]

## 2015-10-19 NOTE — Progress Notes (Signed)
Came for ABG pt is off unit per family states pt is in Cat scan.

## 2015-10-19 NOTE — Progress Notes (Signed)
Internal Medicine Attending  Date: 10/19/2015  Patient name: Francisco [name] Medical record number: [id number] Date of birth: 12-30-1945 Age: 70 y.o. Gender: male  I saw and evaluated the patient. I reviewed the resident's note by Dr. Johnny Bridge and I agree with the resident's findings and plans as documented in his progress note.  [name] was seen on rounds this morning after easily tolerating his clear liquid breakfast. He had no nausea or vomiting and his abdominal pain was minimal. Examination revealed a soft abdomen with hypoactive bowel sounds and no guarding or rebound. The bandages over his surgical incisions were clean and dry. He had a drop in his hemoglobin as well as fevers last night. We are continuing the Zosyn for an additional day and have encouraged getting him up out of bed and using his incentive spirometer. We will reassess the stability of the CBC in the morning, but he has remained hemodynamically stable. I anticipate he would be ready for discharge in the morning.

## 2015-10-19 NOTE — Consult Note (Addendum)
PULMONARY / CRITICAL CARE MEDICINE   Name: Francisco [name] MRN: [id number] DOB: Aug 18, 1946    ADMISSION DATE:  10/17/2015 CONSULTATION DATE:  10/19/15  REFERRING MD:  Josem Kaufmann  CHIEF COMPLAINT:  Hypoxic respiratory failure  HISTORY OF PRESENT ILLNESS:  Pt is encephelopathic; therefore, this HPI is obtained from chart review. Francisco [name] is a 70 y.o. male with PMH as outlined below. He is POD # 1 s/p lap chole with neg IOC which was completed without complication (done 10/18/15).  On 02/06, he had fever to 102.2.  Later that night, he had change in mental status with increased somnolence, decreased responsiveness, mod respiratory distress, hypoxic resp failure. He was placed on NRB and after several hours, remained hypoxic (ABG 7.326 / 45 / 65).  CXR revealed low volumes with basilar atx.  CTA was obtained to rule out PE (which it did), but did show bibasilar opacities L > R.  PCCM was called for further recs.  PAST MEDICAL HISTORY :  He  has a past medical history of Hypertension; Back pain; DJD (degenerative joint disease); Scoliosis; Bursitis; GERD (gastroesophageal reflux disease); SBO (small bowel obstruction) (HCC) (08/17/2012); and Type 2 diabetes mellitus (HCC) (10/17/2015).  PAST SURGICAL HISTORY: He  has past surgical history that includes No past surgeries.  No Known Allergies  No current facility-administered medications on file prior to encounter.   Current Outpatient Prescriptions on File Prior to Encounter  Medication Sig  . HYDROcodone-homatropine (HYCODAN) 5-1.5 MG/5ML syrup Take 5 mLs by mouth every 4 (four) hours as needed for cough.    FAMILY HISTORY:  His has no family status information on file.   SOCIAL HISTORY: He  reports that he has never smoked. His smokeless tobacco use includes Chew. He reports that he does not drink alcohol or use illicit drugs.  REVIEW OF SYSTEMS:   Unable to obtain as pt is encephalopathic.  SUBJECTIVE:  On NRB, minimally  responsive.  VITAL SIGNS: BP 157/96 mmHg  Pulse 126  Temp(Src) 98.1 F (36.7 C) (Oral)  Resp 20  Ht  (1.6 m)  Wt 72.8 kg (160 lb 7.9 oz)  BMI 28.44 kg/m2  SpO2 97%  HEMODYNAMICS:    VENTILATOR SETTINGS: Vent Mode:  [-]  FiO2 (%):  [100 %] 100 %  INTAKE / OUTPUT: I/O last 3 completed shifts: In: 1290 [P.O.:240; I.V.:1000; IV Piggyback:50] Out: 625 [Urine:550; Blood:75]   PHYSICAL EXAMINATION: General: Adult male, in mild resp distress. Neuro: Hypersomnolent, arouses to voice.  HEENT: [place]/AT. PERRL, sclerae anicteric.  NRB in place. Cardiovascular: RRR, no M/R/G.  Lungs: Respirations even and unlabored.  Coarse in bases bilaterally. Abdomen: BS hypoactive, abd soft, NT/ND.  Incision dressings C/D/I. Musculoskeletal: No gross deformities, no edema.  Skin: Intact, warm, no rashes.  LABS:  BMET  Recent Labs Lab 10/18/15 0525 10/19/15 0553 10/19/15 1718  NA 139 139 136  K 4.2 4.1 3.8  CL 102 105 99*  CO2 BUN 16 21* 24*  CREATININE 1.47* 1.39* 1.39*  GLUCOSE 131* 133* 196*    Electrolytes  Recent Labs Lab 10/18/15 0525 10/19/15 0553 10/19/15 1718  CALCIUM 8.8* 7.9* 8.1*    CBC  Recent Labs Lab 10/18/15 0525 10/19/15 0553 10/19/15 1718  WBC 19.2* 13.5* 12.3*  HGB 15.2 12.5* 11.9*  HCT 44.2 37.2* 35.9*  PLT 252 202 206    Coag's No results for input(s): APTT, INR in the last 168 hours.  Sepsis Markers  Recent Labs Lab 10/17/15 0344  LATICACIDVEN  1.59    ABG  Recent Labs Lab 10/19/15 2024 10/19/15 2245  PHART 7.345* 7.326*  PCO2ART 46.4* 45.7*  PO2ART 48.9* 65.0*    Liver Enzymes  Recent Labs Lab 10/18/15 0525 10/19/15 0553 10/19/15 1718  AST 61* 65* 58*  ALT 48 49 44  ALKPHOS 98 70 72  BILITOT 2.9* 2.6* 2.2*  ALBUMIN 3.4* 2.6* 2.7*    Cardiac Enzymes  Recent Labs Lab 10/19/15 1649  TROPONINI 0.04*    Glucose  Recent Labs Lab 10/19/15 0058 10/19/15 0507 10/19/15 0807 10/19/15 1152  10/19/15 1619 10/19/15 1957  GLUCAP 102* 129* 119* 155* 119* 159*    Imaging Ct Angio Chest Pe W/cm &/or Wo Cm  10/19/2015  CLINICAL DATA:  Acute onset of shortness of breath and confusion. Status post recent cholecystectomy. Initial encounter. EXAM: CT ANGIOGRAPHY CHEST WITH CONTRAST TECHNIQUE: Multidetector CT imaging of the chest was performed using the standard protocol during bolus administration of intravenous contrast. Multiplanar CT image reconstructions and MIPs were obtained to evaluate the vascular anatomy. CONTRAST:  75mL OMNIPAQUE IOHEXOL 350 MG/ML SOLN COMPARISON:  Chest radiograph performed earlier today at 5:32 p.m. FINDINGS: There is no evidence of pulmonary embolus. Patchy bibasilar airspace opacity is noted, worse on the left, which may reflect postoperative atelectasis or pneumonia. A trace right pleural effusion is seen. No pneumothorax is identified. No masses are identified; no abnormal focal contrast enhancement is seen. The esophagus is largely filled with fluid, likely reflecting esophageal dysmotility. A 1.5 cm azygoesophageal recess node is seen. There is obscuration of bronchioles to the medial right lower lobe at the inferior right hilum, with vague associated soft tissue density. Though this could reflect sequelae of aspiration, a poorly characterized malignancy is a concern. No pericardial effusion is identified. The great vessels are grossly unremarkable in appearance. No axillary lymphadenopathy is seen. The visualized portions of the thyroid gland are unremarkable in appearance. There is borderline aneurysmal dilatation of the ascending thoracic aorta to 4.1 cm in maximal AP dimension. The visualized portions of the liver and spleen are unremarkable. Note is made of a collection of fluid and air at the gallbladder fossa, measuring approximately 7.1 x 3.4 x 4.2 cm, raising concern for a bile leak. Would correlate with the patient's symptoms. No acute osseous abnormalities are  seen. There is mild developmental wedging of vertebral bodies at the lower thoracic spine. Review of the MIP images confirms the above findings. IMPRESSION: 1. No evidence of pulmonary embolus. 2. Patchy bibasilar airspace opacities, worse on the left, which may reflect postoperative atelectasis or pneumonia. Given clinical concern for aspiration, this could reflect aspiration pneumonia. Trace right pleural effusion seen. 3. Collection of fluid and air at the gallbladder fossa, measuring 7.1 x 3.4 x 4.2 cm, raising concern for bile leak. Would correlate with the patient's symptoms, and consider HIDA scan for further evaluation. 4. Obscuration of bronchioles to the medial right lower lobe at the inferior right hilum, with vague soft tissue density and an adjacent 1.5 cm azygoesophageal recess node. Though this could reflect sequelae of aspiration, a poorly characterized bronchogenic malignancy is a concern. PET/CT would be helpful for further evaluation. 5. Esophagus largely filled with fluid, likely reflecting esophageal dysmotility. 6. 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 Recommend annual imaging followup by CTA or MRA. This recommendation follows 2010 ACCF/AHA/AATS/ACR/ASA/SCA/SCAI/SIR/STS/SVM Guidelines for the Diagnosis and Management of Patients with Thoracic Aortic Disease. Circulation. 2010; 121: Z610-R604 These results were called by  telephone at the time of interpretation on 10/19/2015 at 10:32 pm to Dr. Johna Roles, who verbally acknowledged these results. Electronically Signed   By: Roanna Raider M.D.   On: 10/19/2015 22:37   Dg Chest Port 1 View  10/19/2015  CLINICAL DATA:  Confusion. Altered mental status. Chest and abdominal pain. EXAM: PORTABLE CHEST 1 VIEW COMPARISON:  10/17/2015 FINDINGS: Very low lung volumes with bibasilar opacities, likely atelectasis. Mild cardiomegaly. Possible small effusions. No acute bony abnormality. IMPRESSION:  Low lung volumes, bibasilar atelectasis and possible small effusions. Electronically Signed   By: Charlett Nose M.D.   On: 10/19/2015 17:43   Dg Abd Portable 2v  10/19/2015  CLINICAL DATA:  Confusion, altered mental status P EXAM: PORTABLE ABDOMEN - 2 VIEW COMPARISON:  08/18/2012 FINDINGS: Mild centralized gaseous distention of bowel, both large and small bowel, possibly mild ileus. Prior cholecystectomy. No free air organomegaly. IMPRESSION: Mild diffuse gaseous distention of bowel, possibly mild ileus. Prior cholecystectomy. Electronically Signed   By: Charlett Nose M.D.   On: 10/19/2015 17:44     STUDIES:  CT A / P 02/04 > acute cholecystitis. CTA chest 02/06 > no PE.  Bibasilar opacities L > R raising concern for aspiration.  Trace right effusion.  Collection of fluid and air at gallbladder fossa raising question of bile leak.  CXR 02/06 > bibasilar atx, low volumes.  CULTURES: Blood 02/04 > Sputum 02/06 >  ANTIBIOTICS: Zosyn 02/04 >  Vanc 02/06 > Diflucan 02/06 >  SIGNIFICANT EVENTS: 02/04 > admitted with abd pain due to cholecystitis. 02/05 > lap chole. 02/06 > fever spike > resp distress > hypoxia > transfer to ICU.  LINES/TUBES: ETT 02/06 >   ASSESSMENT / PLAN:  PULMONARY A: Acute hypoxic respiratory failure - likely multifactorial due to aspiration PNA, atx, splinting from pain, ? Narcotics. Aspiration PNA. P:   Transfer to ICU for intubation. Full vent support. Wean as able. VAP prevention measures. SBT in AM if able. Continue empiric abx, follow cultures. DuoNebs / Albuterol. CXR in AM.  CARDIOVASCULAR A:  Hx HTN. P:  Monitor hemodynamics. Assess lactate, troponins.  RENAL A:   AKI. Pseudohypocalcemia - corrects to 9.14. P:   NS @ 100. Assess ionized calcium. BMP in AM.  GASTROINTESTINAL A:   Acute cholecystitis - s/p lap chole 02/05. Collection of fluid and air at gallbladder fossa raising question of bile leak - suspect anticipated / normal  post op findings. GERD. Nutrition. Hx SBO. P:   Post op care per [facility]. SUP: Pantoprazole. NPO.  HEMATOLOGIC A:   Mild anemia. VTE Prophylaxis. P:  Transfuse for Hgb < 7. SCD's / Heparin. CBC in AM.  INFECTIOUS A:   Aspiration PNA. At risk for bile leak - vs anticipated post operative changes. P:   Abx as above (Vanc / Zosyn / Diflucan).  Follow cultures as above. PCT algorithm to limit abx exposure.  ENDOCRINE A:   DM.  P:   SSI. Hold outpatient metformin.  NEUROLOGIC A:   Acute encephalopathy - possibly infectious vs narcotics. Hx back pain, DJD, scoliosis. P:   Sedation:  Fentanyl gtt / Midazolam PRN. RASS goal: 0 to -1. Daily WUA. D/c flexeril, oxycodone.  Family updated: Family at bedside by Dr. Tyson Alias.  Interdisciplinary Family Meeting v Palliative Care Meeting:  Due by: 02/12.  CC time:  35 minutes.   Rutherford Guys, [place] - [facility] Pager: [phone]  or [phone] 10/19/2015, 11:22  PM  Seen 1144 pm 2/6 STAFF NOTE: I, Rory Percy, MD FACP have personally reviewed patient's available data, including medical history, events of note, physical examination and test results as part of my evaluation. I have discussed with resident/NP and other care providers such as pharmacist, RN and RRT. In addition, I personally evaluated patient and elicited key findings of: lethargic, basilar ronchi, hypoxic on 100% nrb, CT reviewed, big concerns asp event, bibasilar PNA, also noted fluid / air post op around gallbladder, at risk leak, add empiric vanc and diflucan to zosyn for now, will need gen surgery input once stabilized with resp status and hida consideration, move to ICU now and consider intubation, NOT a bipap candidate given asp concerns and fluid filled esophagus on CT, assess sputum, updated family at bedside, also spoke with house staff, follow renal fxn, allow pos balance The patient is critically ill with  multiple organ systems failure and requires high complexity decision making for assessment and support, frequent evaluation and titration of therapies, application of advanced monitoring technologies and extensive interpretation of multiple databases.   Critical Care Time devoted to patient care services described in this note is 35 Minutes. This time reflects time of care of this signee: Rory Percy, MD FACP. This critical care time does not reflect procedure time, or teaching time or supervisory time of PA/NP/Med student/Med Resident etc but could involve care discussion time. Rest per NP/medical resident whose note is outlined above and that I agree with   Mcarthur Rossetti. Tyson Alias, MD, FACP Pgr: [phone] [facility] 10/20/2015 12:19 AM    Fluids started  Sepsis - Repeat Assessment  Performed at:   1250 am 10/20/15  Vitals     Blood pressure 157/96, pulse 126, temperature 98.1 F (36.7 C), temperature source Oral, resp. rate 20, height 5\' 3"  (1.6 m), weight 72.8 kg (160 lb 7.9 oz), SpO2 97 %.  Heart:     Tachycardic  Lungs:    Rhonchi  Capillary Refill:   <2 sec  Peripheral Pulse:   Radial pulse palpable  Skin:     Normal Color   Mcarthur Rossetti. Tyson Alias, MD, FACP Pgr: [phone] [facility]

## 2015-10-19 NOTE — Progress Notes (Signed)
Came to assess pt, pt is in moderate respiratory distress, chugging to breath apical region of the chest. intercostal muscle usage noted as well. Pt oxygen saturation is in the low 80's at this time. MD aware. Neb given/administer. Pt taking neb tolerating it well. MD wants pt to be placed on NRB after treatment. RT will monitor, pt appears very uncomfortable

## 2015-10-19 NOTE — Progress Notes (Signed)
Noted patient to be less verbally responsive,moaning, confused, unable to state name and where he is at the moment. VSS. Md made aware. Daughter at bedside to help interpret for patient.Will monitor.

## 2015-10-19 NOTE — Progress Notes (Signed)
Subjective:  Pt is POD1 s/p lap chole He is doing well. Pain is well controlled with oxycodone PRN Patient knows to use the incentive spirometry and ambulate and sit in chair today Son at bedside.  He is continuing to have some fevers to Tmax of 102.2. Morning Temperature was 101.5       Objective: Vital signs in last 24 hours: Filed Vitals:   10/18/15 1721 10/18/15 2034 10/19/15 0102 10/19/15 0508  BP: 122/85 134/80 109/69 117/75  Pulse: 102 113 106 111  Temp: 98 F (36.7 C) 102.2 F (39 C) 98.6 F (37 C) 101.5 F (38.6 C)  TempSrc: Oral Oral Oral Oral  Resp: Height:      Weight:      SpO2: 98% 96% 93% 94%   Weight change:   Intake/Output Summary (Last 24 hours) at 10/19/15 1158 Last data filed at 10/19/15 0935  Gross per 24 hour  Intake   1290 ml  Output    625 ml  Net    665 ml   General: Vital signs reviewed. Patient in no acute distress Cardiovascular: regular rate, rhythm, no murmur appreciated  Pulmonary/Chest: Clear to auscultation bilaterally, no wheezes, or crackles  Abdominal: abdomen is appropriately tender over incisions- POD1. No overt drainage. , + bowel sounds, Extremities: No lower extremity edema bilaterally, pulses symmetric and intact bilaterally.    Lab Results: Results for orders placed or performed during the hospital encounter of 10/17/15 (from the past 24 hour(s))  Glucose, capillary     Status: Abnormal   Collection Time: 10/18/15  2:18 PM  Result Value Ref Range   Glucose-Capillary 136 (H) 65 - 99 mg/dL  Glucose, capillary     Status: Abnormal   Collection Time: 10/18/15  3:05 PM  Result Value Ref Range   Glucose-Capillary 150 (H) 65 - 99 mg/dL   Comment 1 Notify RN   Glucose, capillary     Status: Abnormal   Collection Time: 10/18/15  8:16 PM  Result Value Ref Range   Glucose-Capillary 152 (H) 65 - 99 mg/dL  Glucose, capillary     Status: Abnormal   Collection Time: 10/19/15 12:58 AM  Result Value Ref Range     Glucose-Capillary 102 (H) 65 - 99 mg/dL  Glucose, capillary     Status: Abnormal   Collection Time: 10/19/15  5:07 AM  Result Value Ref Range   Glucose-Capillary 129 (H) 65 - 99 mg/dL  CBC     Status: Abnormal   Collection Time: 10/19/15  5:53 AM  Result Value Ref Range   WBC 13.5 (H) 4.0 - 10.5 K/uL   RBC 4.36 4.22 - 5.81 MIL/uL   Hemoglobin 12.5 (L) 13.0 - 17.0 g/dL   HCT 19.1 (L) 47.8 - 29.5 %   MCV 85.3 78.0 - 100.0 fL   MCH 28.7 26.0 - 34.0 pg   MCHC 33.6 30.0 - 36.0 g/dL   RDW 62.1 30.8 - 65.7 %   Platelets 202 150 - 400 K/uL  Comprehensive metabolic panel     Status: Abnormal   Collection Time: 10/19/15  5:53 AM  Result Value Ref Range   Sodium 139 135 - 145 mmol/L   Potassium 4.1 3.5 - 5.1 mmol/L   Chloride 105 101 - 111 mmol/L   CO2 23 22 - 32 mmol/L   Glucose, Bld 133 (H) 65 - 99 mg/dL   BUN 21 (H) 6 - 20 mg/dL   Creatinine, Ser 8.46 (H) 0.61 -  1.24 mg/dL   Calcium 7.9 (L) 8.9 - 10.3 mg/dL   Total Protein 6.1 (L) 6.5 - 8.1 g/dL   Albumin 2.6 (L) 3.5 - 5.0 g/dL   AST 65 (H) 15 - 41 U/L   ALT 49 17 - 63 U/L   Alkaline Phosphatase 70 38 - 126 U/L   Total Bilirubin 2.6 (H) 0.3 - 1.2 mg/dL   GFR calc non Af Amer 50 (L) >60 mL/min   GFR calc Af Amer 58 (L) >60 mL/min   Anion gap 11 5 - 15  Glucose, capillary     Status: Abnormal   Collection Time: 10/19/15  8:07 AM  Result Value Ref Range   Glucose-Capillary 119 (H) 65 - 99 mg/dL  Glucose, capillary     Status: Abnormal   Collection Time: 10/19/15 11:52 AM  Result Value Ref Range   Glucose-Capillary 155 (H) 65 - 99 mg/dL    Micro Results: Recent Results (from the past 240 hour(s))  Culture, blood (routine x 2)     Status: None (Preliminary result)   Collection Time: 10/17/15  1:55 PM  Result Value Ref Range Status   Specimen Description BLOOD RESISTANT HAND  Final   Special Requests BOTTLES DRAWN AEROBIC ONLY  8CC  Final   Culture NO GROWTH 1 DAY  Final   Report Status PENDING  Incomplete  Surgical pcr  screen     Status: Abnormal   Collection Time: 10/17/15  8:55 PM  Result Value Ref Range Status   MRSA, PCR NEGATIVE NEGATIVE Final   Staphylococcus aureus POSITIVE (A) NEGATIVE Final    Comment:        The Xpert SA Assay (FDA approved for NASAL specimens in patients over 43 years of age), is one component of a comprehensive surveillance program.  Test performance has been validated by [facility] for patients greater than or equal to 58 year old. It is not intended to diagnose infection nor to guide or monitor treatment.    Studies/Results: Dg Chest 2 View  10/17/2015  CLINICAL DATA:  Acute dyspnea. EXAM: CHEST  2 VIEW COMPARISON:  August 17, 2012. FINDINGS: Stable cardiomediastinal silhouette. No pneumothorax or pleural effusion is noted. Increased bibasilar interstitial opacities are noted consistent with subsegmental atelectasis. Bony thorax is unremarkable. IMPRESSION: Increased bibasilar subsegmental atelectasis. Electronically Signed   By: Lupita Raider, M.D.   On: 10/17/2015 14:35   Dg Cholangiogram Operative  10/18/2015  CLINICAL DATA:  Acute cholecystitis. EXAM: INTRAOPERATIVE CHOLANGIOGRAM TECHNIQUE: Cholangiographic images from the C-arm fluoroscopic device were submitted for interpretation post-operatively. Please see the procedural report for the amount of contrast and the fluoroscopy time utilized. COMPARISON:  CT 10/17/2015 FINDINGS: Mild dilatation of the extrahepatic biliary system. Contrast fills the bile ducts and rapidly drains into the duodenum. Minor filling of the pancreatic duct. Mild filling of the central intrahepatic ducts. No large filling defects. IMPRESSION: Patent biliary system without obstructing stones. Electronically Signed   By: Richarda Overlie M.D.   On: 10/18/2015 14:24   Medications: I have reviewed the patient's current medications. Scheduled Meds: . Influenza vac split quadrivalent PF  0.5 mL Intramuscular Tomorrow-1000  . insulin aspart  0-5 Units  Subcutaneous QHS  . insulin aspart  0-9 Units Subcutaneous TID WC  . pantoprazole  40 mg Oral Daily  . pneumococcal 23 valent vaccine  0.5 mL Intramuscular Tomorrow-1000   Continuous Infusions:   PRN Meds:.acetaminophen, cyclobenzaprine, ondansetron **OR** ondansetron (ZOFRAN) IV, oxyCODONE Assessment/Plan: Principal Problem:   Acute cholecystitis  Active Problems:   GERD   Hypertension   Type 2 diabetes mellitus (HCC)   Cholelithiasis and acute cholecystitis without obstruction  Acute cholecystitis with cholelithiasis:  Pt is POD1 s/p lap chole. His pain is well controlled. Continuing to spike fever to Tmax of 102.2 and T morning of 101.5. Other vital signs stable. His white count has trended down to 13 today.Hgb drop of 3 today- will repeat CBC tomorrow. Confirmed with surgery that pt needs to be on Zosyn until 2/7. As pt is tolerating diet well, stopped the IV fluids. Blood cultures continue to show ngtd.   -advance diet as tolerated -up and out of bed and incentive spirometry -zosyn per pharmacy -oxycodone 10 mg q4 PRN ,morphine discontinued -zofran 4 mg q6 PRN -CBC and CMET in AM  HTN: MAP has been in the 70s, without lisinopril -continue to hold lisinopril and resume on discharge   GERD:  -protonix 40 mg daily   T2DM: HgB of 9.5-  I went back in the room and explained to both patient and the son about diabetes treatment and I will make follow up appointment for him in our clinic to have better control of diabetes. We will discharge him on metformin 1000 mg daily, to be titrated outpatient. His CBGs have been well controlled in the hospital.   -SSI    Dispo: Disposition is deferred at this time, awaiting improvement of current medical problems.  Anticipated discharge in approximately 1 day(s).   The patient does not have a current PCP (No primary care provider on file.) and does need an [facility] hospital follow-up appointment after discharge.  The patient does not have  transportation limitations that hinder transportation to clinic appointments.  .Services Needed at time of discharge: Y = Yes, Blank = No PT:   OT:   RN:   Equipment:   Other:     LOS: 2 days   Deneise Lever, MD 10/19/2015, 11:58 AM

## 2015-10-19 NOTE — Care Management Important Message (Signed)
Important Message  Patient Details  Name: Francisco [name] MRN: [id number] Date of Birth: 01/02/1946   Medicare Important Message Given:       Rayvon Char 10/19/2015, 11:03 AMImportant Message  Patient Details  Name: Francisco [name] MRN: [id number] Date of Birth: Nov 15, 1945   Medicare Important Message Given:       Rayvon Char 10/19/2015, 11:03 AM

## 2015-10-20 ENCOUNTER — Encounter (HOSPITAL_COMMUNITY): Payer: Self-pay | Admitting: General Surgery

## 2015-10-20 ENCOUNTER — Inpatient Hospital Stay (HOSPITAL_COMMUNITY): Payer: Medicare Other

## 2015-10-20 DIAGNOSIS — R079 Chest pain, unspecified: Secondary | ICD-10-CM | POA: Insufficient documentation

## 2015-10-20 DIAGNOSIS — Z419 Encounter for procedure for purposes other than remedying health state, unspecified: Secondary | ICD-10-CM | POA: Insufficient documentation

## 2015-10-20 DIAGNOSIS — K810 Acute cholecystitis: Secondary | ICD-10-CM

## 2015-10-20 LAB — POCT I-STAT 3, ART BLOOD GAS (G3+)
Acid-base deficit: 4 mmol/L — ABNORMAL HIGH (ref 0.0–2.0)
BICARBONATE: 22.2 meq/L (ref 20.0–24.0)
O2 SAT: 98 %
TCO2: 24 mmol/L (ref 0–100)
pCO2 arterial: 50.7 mmHg — ABNORMAL HIGH (ref 35.0–45.0)
pH, Arterial: 7.261 — ABNORMAL LOW (ref 7.350–7.450)
pO2, Arterial: 135 mmHg — ABNORMAL HIGH (ref 80.0–100.0)

## 2015-10-20 LAB — CBC
HEMATOCRIT: 35.3 % — AB (ref 39.0–52.0)
HEMOGLOBIN: 11.6 g/dL — AB (ref 13.0–17.0)
MCH: 28.5 pg (ref 26.0–34.0)
MCHC: 32.9 g/dL (ref 30.0–36.0)
MCV: 86.7 fL (ref 78.0–100.0)
Platelets: 199 10*3/uL (ref 150–400)
RBC: 4.07 MIL/uL — ABNORMAL LOW (ref 4.22–5.81)
RDW: 14.6 % (ref 11.5–15.5)
WBC: 9 10*3/uL (ref 4.0–10.5)

## 2015-10-20 LAB — CBC WITH DIFFERENTIAL/PLATELET
BASOS ABS: 0 10*3/uL (ref 0.0–0.1)
Basophils Relative: 0 %
EOS ABS: 0 10*3/uL (ref 0.0–0.7)
EOS PCT: 0 %
HCT: 38.7 % — ABNORMAL LOW (ref 39.0–52.0)
Hemoglobin: 12.3 g/dL — ABNORMAL LOW (ref 13.0–17.0)
LYMPHS ABS: 2 10*3/uL (ref 0.7–4.0)
LYMPHS PCT: 16 %
MCH: 27.6 pg (ref 26.0–34.0)
MCHC: 31.8 g/dL (ref 30.0–36.0)
MCV: 86.8 fL (ref 78.0–100.0)
MONO ABS: 0.6 10*3/uL (ref 0.1–1.0)
Monocytes Relative: 5 %
Neutro Abs: 9.5 10*3/uL — ABNORMAL HIGH (ref 1.7–7.7)
Neutrophils Relative %: 79 %
PLATELETS: 225 10*3/uL (ref 150–400)
RBC: 4.46 MIL/uL (ref 4.22–5.81)
RDW: 14.5 % (ref 11.5–15.5)
WBC: 12 10*3/uL — AB (ref 4.0–10.5)

## 2015-10-20 LAB — GLUCOSE, CAPILLARY
GLUCOSE-CAPILLARY: 169 mg/dL — AB (ref 65–99)
Glucose-Capillary: 165 mg/dL — ABNORMAL HIGH (ref 65–99)
Glucose-Capillary: 152 mg/dL — ABNORMAL HIGH (ref 65–99)
Glucose-Capillary: 155 mg/dL — ABNORMAL HIGH (ref 65–99)
Glucose-Capillary: 129 mg/dL — ABNORMAL HIGH (ref 65–99)
Glucose-Capillary: 102 mg/dL — ABNORMAL HIGH (ref 65–99)

## 2015-10-20 LAB — URINALYSIS, ROUTINE W REFLEX MICROSCOPIC
Glucose, UA: 100 mg/dL — AB
Ketones, ur: 15 mg/dL — AB
LEUKOCYTES UA: NEGATIVE
NITRITE: NEGATIVE
PH: 5 (ref 5.0–8.0)
Protein, ur: 30 mg/dL — AB
SPECIFIC GRAVITY, URINE: 1.024 (ref 1.005–1.030)

## 2015-10-20 LAB — COMPREHENSIVE METABOLIC PANEL
ALT: 42 U/L (ref 17–63)
AST: 56 U/L — ABNORMAL HIGH (ref 15–41)
Albumin: 2.7 g/dL — ABNORMAL LOW (ref 3.5–5.0)
Alkaline Phosphatase: 79 U/L (ref 38–126)
Anion gap: 13 (ref 5–15)
BUN: 21 mg/dL — ABNORMAL HIGH (ref 6–20)
CHLORIDE: 103 mmol/L (ref 101–111)
CO2: 23 mmol/L (ref 22–32)
Calcium: 8.1 mg/dL — ABNORMAL LOW (ref 8.9–10.3)
Creatinine, Ser: 1.46 mg/dL — ABNORMAL HIGH (ref 0.61–1.24)
GFR, EST AFRICAN AMERICAN: 54 mL/min — AB (ref >60)
GFR, EST NON AFRICAN AMERICAN: 47 mL/min — AB (ref >60)
Glucose, Bld: 170 mg/dL — ABNORMAL HIGH (ref 65–99)
POTASSIUM: 3.4 mmol/L — AB (ref 3.5–5.1)
SODIUM: 139 mmol/L (ref 135–145)
Total Bilirubin: 3.7 mg/dL — ABNORMAL HIGH (ref 0.3–1.2)
Total Protein: 7.3 g/dL (ref 6.5–8.1)

## 2015-10-20 LAB — BASIC METABOLIC PANEL
Anion gap: 11 (ref 5–15)
BUN: 21 mg/dL — AB (ref 6–20)
CHLORIDE: 104 mmol/L (ref 101–111)
CO2: 23 mmol/L (ref 22–32)
CREATININE: 1.36 mg/dL — AB (ref 0.61–1.24)
Calcium: 7.9 mg/dL — ABNORMAL LOW (ref 8.9–10.3)
GFR calc Af Amer: 59 mL/min — ABNORMAL LOW (ref >60)
GFR calc non Af Amer: 51 mL/min — ABNORMAL LOW (ref >60)
GLUCOSE: 178 mg/dL — AB (ref 65–99)
Potassium: 3.9 mmol/L (ref 3.5–5.1)
Sodium: 138 mmol/L (ref 135–145)

## 2015-10-20 LAB — URINE MICROSCOPIC-ADD ON

## 2015-10-20 LAB — PROCALCITONIN
PROCALCITONIN: 12.23 ng/mL
PROCALCITONIN: 15.02 ng/mL

## 2015-10-20 LAB — LACTIC ACID, PLASMA
LACTIC ACID, VENOUS: 1.6 mmol/L (ref 0.5–2.0)
Lactic Acid, Venous: 1.9 mmol/L (ref 0.5–2.0)

## 2015-10-20 LAB — MRSA PCR SCREENING: MRSA by PCR: NEGATIVE

## 2015-10-20 LAB — MAGNESIUM: MAGNESIUM: 1.9 mg/dL (ref 1.7–2.4)

## 2015-10-20 LAB — TROPONIN I: Troponin I: 0.06 ng/mL — ABNORMAL HIGH (ref <0.031)

## 2015-10-20 LAB — PROTIME-INR
INR: 1.33 (ref 0.00–1.49)
PROTHROMBIN TIME: 16.6 s — AB (ref 11.6–15.2)

## 2015-10-20 LAB — PHOSPHORUS: Phosphorus: 1.2 mg/dL — ABNORMAL LOW (ref 2.5–4.6)

## 2015-10-20 LAB — APTT: APTT: 33 s (ref 24–37)

## 2015-10-20 MED ORDER — CHLORHEXIDINE GLUCONATE 0.12% ORAL RINSE (MEDLINE KIT)
15.0000 mL | Freq: Two times a day (BID) | OROMUCOSAL | Status: DC
  Administered 2015-10-20 – 2015-10-21 (×4): 15 mL via OROMUCOSAL

## 2015-10-20 MED ORDER — WHITE PETROLATUM GEL
Status: AC
  Filled 2015-10-20: qty 1

## 2015-10-20 MED ORDER — FENTANYL BOLUS VIA INFUSION
25.0000 ug | INTRAVENOUS | Status: DC | PRN
  Administered 2015-10-20 (×2): 25 ug via INTRAVENOUS
  Filled 2015-10-20: qty 25

## 2015-10-20 MED ORDER — SODIUM CHLORIDE 0.9 % IV SOLN
25.0000 ug/h | INTRAVENOUS | Status: DC
  Administered 2015-10-20: 100 ug/h via INTRAVENOUS
  Administered 2015-10-20 – 2015-10-21 (×2): 200 ug/h via INTRAVENOUS
  Filled 2015-10-20 (×3): qty 50

## 2015-10-20 MED ORDER — SODIUM CHLORIDE 0.9 % IV BOLUS (SEPSIS)
1000.0000 mL | INTRAVENOUS | Status: AC
  Administered 2015-10-20: 1000 mL via INTRAVENOUS

## 2015-10-20 MED ORDER — VANCOMYCIN HCL IN DEXTROSE 750-5 MG/150ML-% IV SOLN
750.0000 mg | Freq: Two times a day (BID) | INTRAVENOUS | Status: DC
  Administered 2015-10-20 – 2015-10-21 (×5): 750 mg via INTRAVENOUS
  Filled 2015-10-20 (×7): qty 150

## 2015-10-20 MED ORDER — MIDAZOLAM HCL 2 MG/2ML IJ SOLN
2.0000 mg | Freq: Once | INTRAMUSCULAR | Status: AC
  Administered 2015-10-20: 2 mg via INTRAVENOUS

## 2015-10-20 MED ORDER — FENTANYL CITRATE (PF) 100 MCG/2ML IJ SOLN
50.0000 ug | Freq: Once | INTRAMUSCULAR | Status: AC
  Administered 2015-10-20: 50 ug via INTRAVENOUS

## 2015-10-20 MED ORDER — POTASSIUM PHOSPHATES 15 MMOLE/5ML IV SOLN
40.0000 meq | Freq: Once | INTRAVENOUS | Status: AC
  Administered 2015-10-20: 40 meq via INTRAVENOUS
  Filled 2015-10-20: qty 9.09

## 2015-10-20 MED ORDER — SODIUM CHLORIDE 0.9 % IV SOLN: INTRAVENOUS | Status: DC

## 2015-10-20 MED ORDER — FENTANYL CITRATE (PF) 100 MCG/2ML IJ SOLN: 50.0000 ug | INTRAMUSCULAR | Status: DC | PRN

## 2015-10-20 MED ORDER — PRO-STAT SUGAR FREE PO LIQD
30.0000 mL | Freq: Two times a day (BID) | ORAL | Status: DC
  Administered 2015-10-20 – 2015-10-21 (×2): 30 mL
  Filled 2015-10-20 (×5): qty 30

## 2015-10-20 MED ORDER — FLUCONAZOLE IN SODIUM CHLORIDE 200-0.9 MG/100ML-% IV SOLN
200.0000 mg | INTRAVENOUS | Status: DC
  Administered 2015-10-20 – 2015-10-21 (×2): 200 mg via INTRAVENOUS
  Filled 2015-10-20 (×3): qty 100

## 2015-10-20 MED ORDER — MIDAZOLAM HCL 2 MG/2ML IJ SOLN
1.0000 mg | INTRAMUSCULAR | Status: AC | PRN
  Administered 2015-10-20 (×3): 1 mg via INTRAVENOUS
  Filled 2015-10-20: qty 2

## 2015-10-20 MED ORDER — ETOMIDATE 2 MG/ML IV SOLN
0.3000 mg/kg | Freq: Once | INTRAVENOUS | Status: AC
  Administered 2015-10-20: 20 mg via INTRAVENOUS
  Filled 2015-10-20: qty 10.92

## 2015-10-20 MED ORDER — FLUCONAZOLE IN SODIUM CHLORIDE 400-0.9 MG/200ML-% IV SOLN
400.0000 mg | Freq: Once | INTRAVENOUS | Status: AC
Start: 2015-10-20 — End: 2015-10-20
  Administered 2015-10-20: 400 mg via INTRAVENOUS
  Filled 2015-10-20: qty 200

## 2015-10-20 MED ORDER — MIDAZOLAM HCL 2 MG/2ML IJ SOLN
INTRAMUSCULAR | Status: AC
  Administered 2015-10-20: 1 mg via INTRAVENOUS
  Filled 2015-10-20: qty 2

## 2015-10-20 MED ORDER — FENTANYL CITRATE (PF) 100 MCG/2ML IJ SOLN
INTRAMUSCULAR | Status: AC
  Administered 2015-10-20: 100 ug
  Filled 2015-10-20: qty 2

## 2015-10-20 MED ORDER — VITAL HIGH PROTEIN PO LIQD
1000.0000 mL | ORAL | Status: DC
Start: 2015-10-20 — End: 2015-10-21
  Administered 2015-10-20 – 2015-10-21 (×2): 1000 mL

## 2015-10-20 MED ORDER — SODIUM CHLORIDE 0.9 % IV BOLUS (SEPSIS)
1000.0000 mL | Freq: Once | INTRAVENOUS | Status: AC
  Administered 2015-10-20: 1000 mL via INTRAVENOUS

## 2015-10-20 MED ORDER — POTASSIUM CHLORIDE IN NACL 20-0.9 MEQ/L-% IV SOLN
INTRAVENOUS | Status: DC
  Administered 2015-10-20: 09:00:00 via INTRAVENOUS
  Filled 2015-10-20 (×2): qty 1000

## 2015-10-20 MED ORDER — ACETAMINOPHEN 325 MG PO TABS
650.0000 mg | ORAL_TABLET | Freq: Four times a day (QID) | ORAL | Status: DC | PRN
  Administered 2015-10-20 – 2015-10-21 (×3): 650 mg via ORAL
  Filled 2015-10-20 (×4): qty 2

## 2015-10-20 MED ORDER — ANTISEPTIC ORAL RINSE SOLUTION (CORINZ)
7.0000 mL | Freq: Four times a day (QID) | OROMUCOSAL | Status: DC
  Administered 2015-10-20 – 2015-10-22 (×9): 7 mL via OROMUCOSAL

## 2015-10-20 MED ORDER — FENTANYL CITRATE (PF) 100 MCG/2ML IJ SOLN
INTRAMUSCULAR | Status: AC
  Administered 2015-10-20: 50 ug via INTRAVENOUS
  Filled 2015-10-20: qty 2

## 2015-10-20 MED ORDER — MIDAZOLAM HCL 2 MG/2ML IJ SOLN
1.0000 mg | INTRAMUSCULAR | Status: DC | PRN
  Administered 2015-10-20 – 2015-10-21 (×3): 1 mg via INTRAVENOUS
  Filled 2015-10-20 (×3): qty 2

## 2015-10-20 MED ORDER — SODIUM CHLORIDE 0.9 % IV BOLUS (SEPSIS)
500.0000 mL | INTRAVENOUS | Status: AC
  Administered 2015-10-20: 500 mL via INTRAVENOUS

## 2015-10-20 MED ORDER — FENTANYL CITRATE (PF) 100 MCG/2ML IJ SOLN: 100.0000 ug | Freq: Once | INTRAMUSCULAR | Status: DC

## 2015-10-20 NOTE — Progress Notes (Signed)
Pharmacy Antibiotic Note  Francisco [name] is a 70 y.o. male admitted on 10/17/2015 with Abdominal pain.  Pharmacy has been consulted for Vancomycin and Fluconazole dosing (already on Zosyn). Pt s/p cholecystectomy on 2/5 and continues to have fevers.  Plan: -Vancomycin 750 mg IV q12h -Continue Zosyn as ordered -Fluconazole 400 mg IV x 1, then 200 mg IV q24h -Drug levels as indicated  -F/U infectious work-up  Height:  (160 cm) Weight: 160 lb 7.9 oz (72.8 kg) IBW/kg (Calculated) : 56.9  Temp (24hrs), Avg:99.5 F (37.5 C), Min:98.1 F (36.7 C), Max:101.5 F (38.6 C)   Recent Labs Lab 10/17/15 0335 10/17/15 0344 10/18/15 0525 10/19/15 0553 10/19/15 1718  WBC 12.1*  --  19.2* 13.5* 12.3*  CREATININE 0.88  --  1.47* 1.39* 1.39*  LATICACIDVEN  --  1.59  --   --   --     Estimated Creatinine Clearance: 44.3 mL/min (by C-G formula based on Cr of 1.39).    No Known Allergies   Francisco [name] 10/20/2015 12:06 AM

## 2015-10-20 NOTE — Progress Notes (Signed)
Pt transferred to 96M, report given to [facility]

## 2015-10-20 NOTE — Progress Notes (Signed)
Patient ID: Francisco [name], male   DOB: [birth year], 70 y.o.   MRN: [id number]     [address] [place], [place] [address]    Phone: [phone] FAX: [phone]     Subjective: k 3.4  sCr 1.46.  t bili 3.7 IOC was negative.  Febrile 101.7. Max.   Objective:  Vital signs:  Filed Vitals:   10/20/15 0400 10/20/15 0500 10/20/15 0600 10/20/15 0700  BP: 122/71 103/68 114/76 121/76  Pulse: 122 115 111 114  Temp: 101.7 F (38.7 C) 101.5 F (38.6 C) 100.9 F (38.3 C) 101.5 F (38.6 C)  TempSrc:      Resp: _0 Height:      Weight:      SpO2: 100% 100% 100% 100%    Last BM Date: 10/16/15  Intake/Output   Yesterday:  02/06 0701 - 02/07 0700 In: 1066.2 [P.O.:120; I.V.:796.2; IV Piggyback:150] Out: 960 [Urine:760; Emesis/NG output:200] This shift: I/O last 3 completed shifts: In: 1236.2 [P.O.:240; I.V.:796.2; IV Piggyback:200] Out: 1510 [Urine:1310; Emesis/NG output:200] Total I/O In: -  Out: 60 [Urine:60]  Physical Exam: General: Pt sedated on vent.  Chest: cta CV:  Pulses intact.  Regular rhythm Abdomen: +BS, abdomen is soft, distended.  Dressings removed, steri strips in place.  Ext:  SCDs BLE.  No mjr edema.  No cyanosis Skin: No petechiae / purpura   Problem List:   Principal Problem:   Acute cholecystitis Active Problems:   GERD   Hypertension   Type 2 diabetes mellitus (HCC)   Cholelithiasis and acute cholecystitis without obstruction   Acute hypoxemic respiratory failure (HCC)   Abdominal pain   Dyspnea   Chest pain   Surgery, elective    Results:   Labs: Results for orders placed or performed during the hospital encounter of 10/17/15 (from the past 48 hour(s))  Glucose, capillary     Status: Abnormal   Collection Time: 10/18/15  7:33 AM  Result Value Ref Range   Glucose-Capillary 120 (H) 65 - 99 mg/dL   Comment 1 Notify RN   Glucose, capillary     Status: Abnormal    Collection Time: 10/18/15  2:18 PM  Result Value Ref Range   Glucose-Capillary 136 (H) 65 - 99 mg/dL  Glucose, capillary     Status: Abnormal   Collection Time: 10/18/15  3:05 PM  Result Value Ref Range   Glucose-Capillary 150 (H) 65 - 99 mg/dL   Comment 1 Notify RN   Glucose, capillary     Status: Abnormal   Collection Time: 10/18/15  8:16 PM  Result Value Ref Range   Glucose-Capillary 152 (H) 65 - 99 mg/dL  Glucose, capillary     Status: Abnormal   Collection Time: 10/19/15 12:58 AM  Result Value Ref Range   Glucose-Capillary 102 (H) 65 - 99 mg/dL  Glucose, capillary     Status: Abnormal   Collection Time: 10/19/15  5:07 AM  Result Value Ref Range   Glucose-Capillary 129 (H) 65 - 99 mg/dL  CBC     Status: Abnormal   Collection Time: 10/19/15  5:53 AM  Result Value Ref Range   WBC 13.5 (H) 4.0 - 10.5 K/uL   RBC 4.36 4.22 - 5.81 MIL/uL   Hemoglobin 12.5 (L) 13.0 - 17.0 g/dL   HCT 37.2 (L) 39.0 - 52.0 %   MCV 85.3 78.0 - 100.0 fL   MCH 28.7 26.0 -  34.0 pg   MCHC 33.6 30.0 - 36.0 g/dL   RDW 14.4 11.5 - 15.5 %   Platelets 202 150 - 400 K/uL  Comprehensive metabolic panel     Status: Abnormal   Collection Time: 10/19/15  5:53 AM  Result Value Ref Range   Sodium 139 135 - 145 mmol/L   Potassium 4.1 3.5 - 5.1 mmol/L   Chloride 105 101 - 111 mmol/L   CO2 23 22 - 32 mmol/L   Glucose, Bld 133 (H) 65 - 99 mg/dL   BUN 21 (H) 6 - 20 mg/dL   Creatinine, Ser 1.39 (H) 0.61 - 1.24 mg/dL   Calcium 7.9 (L) 8.9 - 10.3 mg/dL   Total Protein 6.1 (L) 6.5 - 8.1 g/dL   Albumin 2.6 (L) 3.5 - 5.0 g/dL   AST 65 (H) 15 - 41 U/L   ALT 49 17 - 63 U/L   Alkaline Phosphatase 70 38 - 126 U/L   Total Bilirubin 2.6 (H) 0.3 - 1.2 mg/dL   GFR calc non Af Amer 50 (L) >60 mL/min   GFR calc Af Amer 58 (L) >60 mL/min    Comment: (NOTE) The eGFR has been calculated using the CKD EPI equation. This calculation has not been validated in all clinical situations. eGFR's persistently <60 mL/min signify  possible Chronic Kidney Disease.    Anion gap 11 5 - 15  Glucose, capillary     Status: Abnormal   Collection Time: 10/19/15  8:07 AM  Result Value Ref Range   Glucose-Capillary 119 (H) 65 - 99 mg/dL  Glucose, capillary     Status: Abnormal   Collection Time: 10/19/15 11:52 AM  Result Value Ref Range   Glucose-Capillary 155 (H) 65 - 99 mg/dL  Glucose, capillary     Status: Abnormal   Collection Time: 10/19/15  4:19 PM  Result Value Ref Range   Glucose-Capillary 119 (H) 65 - 99 mg/dL  Troponin I (q 6hr x 3)     Status: Abnormal   Collection Time: 10/19/15  4:49 PM  Result Value Ref Range   Troponin I 0.04 (H) <0.031 ng/mL    Comment:        PERSISTENTLY INCREASED TROPONIN VALUES IN THE RANGE OF 0.04-0.49 ng/mL CAN BE SEEN IN:       -UNSTABLE ANGINA       -CONGESTIVE HEART FAILURE       -MYOCARDITIS       -CHEST TRAUMA       -ARRYHTHMIAS       -LATE PRESENTING MYOCARDIAL INFARCTION       -COPD   CLINICAL FOLLOW-UP RECOMMENDED.   CBC     Status: Abnormal   Collection Time: 10/19/15  5:18 PM  Result Value Ref Range   WBC 12.3 (H) 4.0 - 10.5 K/uL   RBC 4.19 (L) 4.22 - 5.81 MIL/uL   Hemoglobin 11.9 (L) 13.0 - 17.0 g/dL   HCT 35.9 (L) 39.0 - 52.0 %   MCV 85.7 78.0 - 100.0 fL   MCH 28.4 26.0 - 34.0 pg   MCHC 33.1 30.0 - 36.0 g/dL   RDW 14.5 11.5 - 15.5 %   Platelets 206 150 - 400 K/uL  Comprehensive metabolic panel     Status: Abnormal   Collection Time: 10/19/15  5:18 PM  Result Value Ref Range   Sodium 136 135 - 145 mmol/L   Potassium 3.8 3.5 - 5.1 mmol/L   Chloride 99 (L) 101 - 111 mmol/L   CO2  23 22 - 32 mmol/L   Glucose, Bld 196 (H) 65 - 99 mg/dL   BUN 24 (H) 6 - 20 mg/dL   Creatinine, Ser 1.39 (H) 0.61 - 1.24 mg/dL   Calcium 8.1 (L) 8.9 - 10.3 mg/dL   Total Protein 6.2 (L) 6.5 - 8.1 g/dL   Albumin 2.7 (L) 3.5 - 5.0 g/dL   AST 58 (H) 15 - 41 U/L   ALT 44 17 - 63 U/L   Alkaline Phosphatase 72 38 - 126 U/L   Total Bilirubin 2.2 (H) 0.3 - 1.2 mg/dL   GFR calc  non Af Amer 50 (L) >60 mL/min   GFR calc Af Amer 58 (L) >60 mL/min    Comment: (NOTE) The eGFR has been calculated using the CKD EPI equation. This calculation has not been validated in all clinical situations. eGFR's persistently <60 mL/min signify possible Chronic Kidney Disease.    Anion gap 14 5 - 15  Glucose, capillary     Status: Abnormal   Collection Time: 10/19/15  7:57 PM  Result Value Ref Range   Glucose-Capillary 159 (H) 65 - 99 mg/dL   Comment 1 Notify RN   Blood gas, arterial     Status: Abnormal   Collection Time: 10/19/15  8:24 PM  Result Value Ref Range   O2 Content 4.0 L/min   pH, Arterial 7.345 (L) 7.350 - 7.450   pCO2 arterial 46.4 (H) 35.0 - 45.0 mmHg   pO2, Arterial 48.9 (L) 80.0 - 100.0 mmHg   Bicarbonate 24.7 (H) 20.0 - 24.0 mEq/L   TCO2 26.1 0 - 100 mmol/L   Acid-base deficit 0.3 0.0 - 2.0 mmol/L   O2 Saturation 82.7 %   Patient temperature 98.6    Collection site RIGHT RADIAL    Drawn by [phone]    Sample type ARTERIAL DRAW     Comment: ARTERIAL DRAW   Allens test (pass/fail) PASS PASS  Blood gas, arterial     Status: Abnormal   Collection Time: 10/19/15 10:45 PM  Result Value Ref Range   FIO2 1.00    Delivery systems NON-REBREATHER OXYGEN MASK    pH, Arterial 7.326 (L) 7.350 - 7.450   pCO2 arterial 45.7 (H) 35.0 - 45.0 mmHg   pO2, Arterial 65.0 (L) 80.0 - 100.0 mmHg   Bicarbonate 22.9 20.0 - 24.0 mEq/L   TCO2 24.3 0 - 100 mmol/L   Acid-base deficit 2.0 0.0 - 2.0 mmol/L   O2 Saturation 89.5 %   Patient temperature 100.0    Collection site RIGHT RADIAL    Drawn by [phone]    Sample type ARTERIAL DRAW    Allens test (pass/fail) PASS PASS  CBC     Status: Abnormal   Collection Time: 10/20/15 12:50 AM  Result Value Ref Range   WBC 9.0 4.0 - 10.5 K/uL   RBC 4.07 (L) 4.22 - 5.81 MIL/uL   Hemoglobin 11.6 (L) 13.0 - 17.0 g/dL   HCT 35.3 (L) 39.0 - 52.0 %   MCV 86.7 78.0 - 100.0 fL   MCH 28.5 26.0 - 34.0 pg   MCHC 32.9 30.0 - 36.0 g/dL   RDW 14.6  11.5 - 15.5 %   Platelets 199 150 - 400 K/uL  Basic metabolic panel     Status: Abnormal   Collection Time: 10/20/15 12:50 AM  Result Value Ref Range   Sodium 138 135 - 145 mmol/L   Potassium 3.9 3.5 - 5.1 mmol/L   Chloride 104 101 - 111 mmol/L  CO2 23 22 - 32 mmol/L   Glucose, Bld 178 (H) 65 - 99 mg/dL   BUN 21 (H) 6 - 20 mg/dL   Creatinine, Ser 1.36 (H) 0.61 - 1.24 mg/dL   Calcium 7.9 (L) 8.9 - 10.3 mg/dL   GFR calc non Af Amer 51 (L) >60 mL/min   GFR calc Af Amer 59 (L) >60 mL/min    Comment: (NOTE) The eGFR has been calculated using the CKD EPI equation. This calculation has not been validated in all clinical situations. eGFR's persistently <60 mL/min signify possible Chronic Kidney Disease.    Anion gap 11 5 - 15  Troponin I     Status: Abnormal   Collection Time: 10/20/15 12:50 AM  Result Value Ref Range   Troponin I 0.06 (H) <0.031 ng/mL    Comment:        PERSISTENTLY INCREASED TROPONIN VALUES IN THE RANGE OF 0.04-0.49 ng/mL CAN BE SEEN IN:       -UNSTABLE ANGINA       -CONGESTIVE HEART FAILURE       -MYOCARDITIS       -CHEST TRAUMA       -ARRYHTHMIAS       -LATE PRESENTING MYOCARDIAL INFARCTION       -COPD   CLINICAL FOLLOW-UP RECOMMENDED.   Magnesium     Status: None   Collection Time: 10/20/15 12:50 AM  Result Value Ref Range   Magnesium 1.9 1.7 - 2.4 mg/dL  Procalcitonin     Status: None   Collection Time: 10/20/15 12:50 AM  Result Value Ref Range   Procalcitonin 12.23 ng/mL    Comment:        Interpretation: PCT >= 10 ng/mL: Important systemic inflammatory response, almost exclusively due to severe bacterial sepsis or septic shock. (NOTE)         ICU PCT Algorithm               Non ICU PCT Algorithm    ----------------------------     ------------------------------         PCT < 0.25 ng/mL                 PCT < 0.1 ng/mL     Stopping of antibiotics            Stopping of antibiotics       strongly encouraged.               strongly encouraged.     ----------------------------     ------------------------------       PCT level decrease by               PCT < 0.25 ng/mL       >= 80% from peak PCT       OR PCT 0.25 - 0.5 ng/mL          Stopping of antibiotics                                             encouraged.     Stopping of antibiotics           encouraged.    ----------------------------     ------------------------------       PCT level decrease by              PCT >= 0.25 ng/mL       <  80% from peak PCT        AND PCT >= 0.5 ng/mL             Continuing antibiotics                                              encouraged.       Continuing antibiotics            encouraged.    ----------------------------     ------------------------------     PCT level increase compared          PCT > 0.5 ng/mL         with peak PCT AND          PCT >= 0.5 ng/mL             Escalation of antibiotics                                          strongly encouraged.      Escalation of antibiotics        strongly encouraged.   Phosphorus     Status: Abnormal   Collection Time: 10/20/15 12:50 AM  Result Value Ref Range   Phosphorus 1.2 (L) 2.5 - 4.6 mg/dL  Lactic acid, plasma     Status: None   Collection Time: 10/20/15 12:50 AM  Result Value Ref Range   Lactic Acid, Venous 1.6 0.5 - 2.0 mmol/L  MRSA PCR Screening     Status: None   Collection Time: 10/20/15  1:20 AM  Result Value Ref Range   MRSA by PCR NEGATIVE NEGATIVE    Comment:        The GeneXpert MRSA Assay (FDA approved for NASAL specimens only), is one component of a comprehensive MRSA colonization surveillance program. It is not intended to diagnose MRSA infection nor to guide or monitor treatment for MRSA infections.   Urinalysis, Routine w reflex microscopic (not at [facility])     Status: Abnormal   Collection Time: 10/20/15  1:20 AM  Result Value Ref Range   Color, Urine AMBER (A) YELLOW    Comment: BIOCHEMICALS MAY BE AFFECTED BY COLOR   APPearance CLOUDY (A) CLEAR    Specific Gravity, Urine 1.024 1.005 - 1.030   pH 5.0 5.0 - 8.0   Glucose, UA 100 (A) NEGATIVE mg/dL   Hgb urine dipstick SMALL (A) NEGATIVE   Bilirubin Urine SMALL (A) NEGATIVE   Ketones, ur 15 (A) NEGATIVE mg/dL   Protein, ur 30 (A) NEGATIVE mg/dL   Nitrite NEGATIVE NEGATIVE   Leukocytes, UA NEGATIVE NEGATIVE  Urine microscopic-add on     Status: Abnormal   Collection Time: 10/20/15  1:20 AM  Result Value Ref Range   Squamous Epithelial / LPF 0-5 (A) NONE SEEN   WBC, UA 0-5 0 - 5 WBC/hpf   RBC / HPF 0-5 0 - 5 RBC/hpf   Bacteria, UA MANY (A) NONE SEEN   Crystals CA OXALATE CRYSTALS (A) NEGATIVE   Urine-Other AMORPHOUS URATES/PHOSPHATES   Glucose, capillary     Status: Abnormal   Collection Time: 10/20/15  1:29 AM  Result Value Ref Range   Glucose-Capillary 169 (H) 65 - 99 mg/dL  I-STAT 3, arterial blood gas (G3+)  Status: Abnormal   Collection Time: 10/20/15  1:36 AM  Result Value Ref Range   pH, Arterial 7.261 (L) 7.350 - 7.450   pCO2 arterial 50.7 (H) 35.0 - 45.0 mmHg   pO2, Arterial 135.0 (H) 80.0 - 100.0 mmHg   Bicarbonate 22.2 20.0 - 24.0 mEq/L   TCO2 24 0 - 100 mmol/L   O2 Saturation 98.0 %   Acid-base deficit 4.0 (H) 0.0 - 2.0 mmol/L   Patient temperature 102.7 F    Collection site RADIAL, ALLEN'S TEST ACCEPTABLE    Drawn by RT    Sample type ARTERIAL   CBC with Differential     Status: Abnormal   Collection Time: 10/20/15  2:22 AM  Result Value Ref Range   WBC 12.0 (H) 4.0 - 10.5 K/uL   RBC 4.46 4.22 - 5.81 MIL/uL   Hemoglobin 12.3 (L) 13.0 - 17.0 g/dL   HCT 38.7 (L) 39.0 - 52.0 %   MCV 86.8 78.0 - 100.0 fL   MCH 27.6 26.0 - 34.0 pg   MCHC 31.8 30.0 - 36.0 g/dL   RDW 14.5 11.5 - 15.5 %   Platelets 225 150 - 400 K/uL   Neutrophils Relative % 79 %   Neutro Abs 9.5 (H) 1.7 - 7.7 K/uL   Lymphocytes Relative 16 %   Lymphs Abs 2.0 0.7 - 4.0 K/uL   Monocytes Relative 5 %   Monocytes Absolute 0.6 0.1 - 1.0 K/uL   Eosinophils Relative 0 %   Eosinophils  Absolute 0.0 0.0 - 0.7 K/uL   Basophils Relative 0 %   Basophils Absolute 0.0 0.0 - 0.1 K/uL  Comprehensive metabolic panel     Status: Abnormal   Collection Time: 10/20/15  2:22 AM  Result Value Ref Range   Sodium 139 135 - 145 mmol/L   Potassium 3.4 (L) 3.5 - 5.1 mmol/L   Chloride 103 101 - 111 mmol/L   CO2 23 22 - 32 mmol/L   Glucose, Bld 170 (H) 65 - 99 mg/dL   BUN 21 (H) 6 - 20 mg/dL   Creatinine, Ser 1.46 (H) 0.61 - 1.24 mg/dL   Calcium 8.1 (L) 8.9 - 10.3 mg/dL   Total Protein 7.3 6.5 - 8.1 g/dL   Albumin 2.7 (L) 3.5 - 5.0 g/dL   AST 56 (H) 15 - 41 U/L   ALT 42 17 - 63 U/L   Alkaline Phosphatase 79 38 - 126 U/L   Total Bilirubin 3.7 (H) 0.3 - 1.2 mg/dL   GFR calc non Af Amer 47 (L) >60 mL/min   GFR calc Af Amer 54 (L) >60 mL/min    Comment: (NOTE) The eGFR has been calculated using the CKD EPI equation. This calculation has not been validated in all clinical situations. eGFR's persistently <60 mL/min signify possible Chronic Kidney Disease.    Anion gap 13 5 - 15  Procalcitonin     Status: None   Collection Time: 10/20/15  2:22 AM  Result Value Ref Range   Procalcitonin 15.02 ng/mL    Comment:        Interpretation: PCT >= 10 ng/mL: Important systemic inflammatory response, almost exclusively due to severe bacterial sepsis or septic shock. (NOTE)         ICU PCT Algorithm               Non ICU PCT Algorithm    ----------------------------     ------------------------------         PCT < 0.25 ng/mL  PCT < 0.1 ng/mL     Stopping of antibiotics            Stopping of antibiotics       strongly encouraged.               strongly encouraged.    ----------------------------     ------------------------------       PCT level decrease by               PCT < 0.25 ng/mL       >= 80% from peak PCT       OR PCT 0.25 - 0.5 ng/mL          Stopping of antibiotics                                             encouraged.     Stopping of antibiotics            encouraged.    ----------------------------     ------------------------------       PCT level decrease by              PCT >= 0.25 ng/mL       < 80% from peak PCT        AND PCT >= 0.5 ng/mL             Continuing antibiotics                                              encouraged.       Continuing antibiotics            encouraged.    ----------------------------     ------------------------------     PCT level increase compared          PCT > 0.5 ng/mL         with peak PCT AND          PCT >= 0.5 ng/mL             Escalation of antibiotics                                          strongly encouraged.      Escalation of antibiotics        strongly encouraged.   Protime-INR     Status: Abnormal   Collection Time: 10/20/15  2:22 AM  Result Value Ref Range   Prothrombin Time 16.6 (H) 11.6 - 15.2 seconds   INR 1.33 0.00 - 1.49  APTT     Status: None   Collection Time: 10/20/15  2:22 AM  Result Value Ref Range   aPTT 33 24 - 37 seconds  Glucose, capillary     Status: Abnormal   Collection Time: 10/20/15  3:37 AM  Result Value Ref Range   Glucose-Capillary 165 (H) 65 - 99 mg/dL  Lactic acid, plasma     Status: None   Collection Time: 10/20/15  4:30 AM  Result Value Ref Range   Lactic Acid, Venous 1.9 0.5 - 2.0 mmol/L    Imaging / Studies: Dg Cholangiogram Operative  10/18/2015  CLINICAL DATA:  Acute cholecystitis. EXAM: INTRAOPERATIVE CHOLANGIOGRAM TECHNIQUE: Cholangiographic images from the C-arm fluoroscopic device were submitted for interpretation post-operatively. Please see the procedural report for the amount of contrast and the fluoroscopy time utilized. COMPARISON:  CT 10/17/2015 FINDINGS: Mild dilatation of the extrahepatic biliary system. Contrast fills the bile ducts and rapidly drains into the duodenum. Minor filling of the pancreatic duct. Mild filling of the central intrahepatic ducts. No large filling defects. IMPRESSION: Patent biliary system without obstructing  stones. Electronically Signed   By: Markus Daft M.D.   On: 10/18/2015 14:24   Ct Angio Chest Pe W/cm &/or Wo Cm  10/19/2015  CLINICAL DATA:  Acute onset of shortness of breath and confusion. Status post recent cholecystectomy. Initial encounter. EXAM: CT ANGIOGRAPHY CHEST WITH CONTRAST TECHNIQUE: Multidetector CT imaging of the chest was performed using the standard protocol during bolus administration of intravenous contrast. Multiplanar CT image reconstructions and MIPs were obtained to evaluate the vascular anatomy. CONTRAST:  30m OMNIPAQUE IOHEXOL 350 MG/ML SOLN COMPARISON:  Chest radiograph performed earlier today at 5:32 p.m. FINDINGS: There is no evidence of pulmonary embolus. Patchy bibasilar airspace opacity is noted, worse on the left, which may reflect postoperative atelectasis or pneumonia. A trace right pleural effusion is seen. No pneumothorax is identified. No masses are identified; no abnormal focal contrast enhancement is seen. The esophagus is largely filled with fluid, likely reflecting esophageal dysmotility. A 1.5 cm azygoesophageal recess node is seen. There is obscuration of bronchioles to the medial right lower lobe at the inferior right hilum, with vague associated soft tissue density. Though this could reflect sequelae of aspiration, a poorly characterized malignancy is a concern. No pericardial effusion is identified. The great vessels are grossly unremarkable in appearance. No axillary lymphadenopathy is seen. The visualized portions of the thyroid gland are unremarkable in appearance. There is borderline aneurysmal dilatation of the ascending thoracic aorta to 4.1 cm in maximal AP dimension. The visualized portions of the liver and spleen are unremarkable. Note is made of a collection of fluid and air at the gallbladder fossa, measuring approximately 7.1 x 3.4 x 4.2 cm, raising concern for a bile leak. Would correlate with the patient's symptoms. No acute osseous abnormalities are  seen. There is mild developmental wedging of vertebral bodies at the lower thoracic spine. Review of the MIP images confirms the above findings. IMPRESSION: 1. No evidence of pulmonary embolus. 2. Patchy bibasilar airspace opacities, worse on the left, which may reflect postoperative atelectasis or pneumonia. Given clinical concern for aspiration, this could reflect aspiration pneumonia. Trace right pleural effusion seen. 3. Collection of fluid and air at the gallbladder fossa, measuring 7.1 x 3.4 x 4.2 cm, raising concern for bile leak. Would correlate with the patient's symptoms, and consider HIDA scan for further evaluation. 4. Obscuration of bronchioles to the medial right lower lobe at the inferior right hilum, with vague soft tissue density and an adjacent 1.5 cm azygoesophageal recess node. Though this could reflect sequelae of aspiration, a poorly characterized bronchogenic malignancy is a concern. PET/CT would be helpful for further evaluation. 5. Esophagus largely filled with fluid, likely reflecting esophageal dysmotility. 6. 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 Recommend annual imaging followup by CTA or MRA. This recommendation follows 2010 ACCF/AHA/AATS/ACR/ASA/SCA/SCAI/SIR/STS/SVM Guidelines for the Diagnosis and Management of Patients with Thoracic Aortic Disease. Circulation. 2010; 121:: [id number] results were called by telephone at the time of interpretation on 10/19/2015 at 10:32 pm to Dr. RNaaman Plummer who  verbally acknowledged these results. Electronically Signed   By: Garald Balding M.D.   On: 10/19/2015 22:37   Dg Chest Port 1 View  10/20/2015  CLINICAL DATA:  71 year old male status post intubation. EXAM: PORTABLE CHEST 1 VIEW COMPARISON:  Chest CT dated 10/19/2015 FINDINGS: Endotracheal tube the tip approximately 3.5 cm above the carina. An enteric tube is noted with tip in the left upper abdomen. There are bibasilar airspace  densities corresponding to the opacity seen on the recent CT. There is blunting the left costophrenic angle. No pneumothorax. Top-normal cardiac size. No acute osseous pathology. IMPRESSION: Endotracheal tube above the carina. Bibasilar airspace opacities. Electronically Signed   By: Anner Crete M.D.   On: 10/20/2015 01:25   Dg Chest Port 1 View  10/19/2015  CLINICAL DATA:  Confusion. Altered mental status. Chest and abdominal pain. EXAM: PORTABLE CHEST 1 VIEW COMPARISON:  10/17/2015 FINDINGS: Very low lung volumes with bibasilar opacities, likely atelectasis. Mild cardiomegaly. Possible small effusions. No acute bony abnormality. IMPRESSION: Low lung volumes, bibasilar atelectasis and possible small effusions. Electronically Signed   By: Rolm Baptise M.D.   On: 10/19/2015 17:43   Dg Abd Portable 2v  10/19/2015  CLINICAL DATA:  Confusion, altered mental status P EXAM: PORTABLE ABDOMEN - 2 VIEW COMPARISON:  08/18/2012 FINDINGS: Mild centralized gaseous distention of bowel, both large and small bowel, possibly mild ileus. Prior cholecystectomy. No free air organomegaly. IMPRESSION: Mild diffuse gaseous distention of bowel, possibly mild ileus. Prior cholecystectomy. Electronically Signed   By: Rolm Baptise M.D.   On: 10/19/2015 17:44    Medications / Allergies:  Scheduled Meds: . antiseptic oral rinse  7 mL Mouth Rinse QID  . chlorhexidine gluconate  15 mL Mouth Rinse BID  . fentaNYL (SUBLIMAZE) injection  100 mcg Intravenous Once  . fluconazole (DIFLUCAN) IV  200 mg Intravenous Q24H  . heparin subcutaneous  5,000 Units Subcutaneous 3 times per day  . Influenza vac split quadrivalent PF  0.5 mL Intramuscular Tomorrow-1000  . insulin aspart  0-15 Units Subcutaneous 6 times per day  . ipratropium-albuterol  3 mL Nebulization Q6H  . pantoprazole (PROTONIX) IV  40 mg Intravenous Q24H  . piperacillin-tazobactam (ZOSYN)  IV  3.375 g Intravenous 3 times per day  . pneumococcal 23 valent vaccine  0.5  mL Intramuscular Tomorrow-1000  . vancomycin  750 mg Intravenous BID  . white petrolatum       Continuous Infusions: . sodium chloride 100 mL/hr at 10/20/15 0700  . fentaNYL infusion INTRAVENOUS 200 mcg/hr (10/20/15 0700)   PRN Meds:.acetaminophen, albuterol, fentaNYL, midazolam, ondansetron **OR** ondansetron (ZOFRAN) IV  Antibiotics: Anti-infectives    Start     Dose/Rate Route Frequency Ordered Stop   10/20/15 2300  fluconazole (DIFLUCAN) IVPB 200 mg     200 mg 100 mL/hr over 60 Minutes Intravenous Every 24 hours 10/20/15 0012     10/20/15 0015  vancomycin (VANCOCIN) IVPB 750 mg/150 ml premix     750 mg 150 mL/hr over 60 Minutes Intravenous 2 times daily 10/20/15 0012     10/20/15 0015  fluconazole (DIFLUCAN) IVPB 400 mg     400 mg 100 mL/hr over 120 Minutes Intravenous  Once 10/20/15 0012 10/20/15 0521   10/19/15 1400  piperacillin-tazobactam (ZOSYN) IVPB 3.375 g     3.375 g 12.5 mL/hr over 240 Minutes Intravenous 3 times per day 10/19/15 1230     10/17/15 1400  piperacillin-tazobactam (ZOSYN) IVPB 3.375 g  Status:  Discontinued     3.375  g 12.5 mL/hr over 240 Minutes Intravenous 3 times per day 10/17/15 1334 10/19/15 1116   10/17/15 0600  piperacillin-tazobactam (ZOSYN) IVPB 3.375 g     3.375 g 100 mL/hr over 30 Minutes Intravenous  Once 10/17/15 0553 10/17/15 0743        Assessment/Plan Acute cholecystitis  POD #2 s/p lap chole with NEG IOC---Dr. Kieth Brightly ABLA-mild, stable Acute hypoxic respiratory failure-full vent support, atbx, nebs ID-Zosyn D#3, Vanc D#1 for presumed aspiration PNA VTE prophylaxis-SCD/heparin FEN-k 3.4, change IVF with KCL Dispo-ICU   Erby Pian, ANP-BC [facility] Pager [phone](7A-4:30P) For consults and floor pages call [phone](7A-4:30P)  10/20/2015 7:28 AM

## 2015-10-20 NOTE — Progress Notes (Signed)
UR Completed. Topaz Raglin, RN, BSN.  [phone] 

## 2015-10-20 NOTE — Progress Notes (Signed)
Pt received to 2M13, requiring emergent intubation post rapid response, transferred from 6E.  Received report at bedside from Vanessa Ralphs, RN.

## 2015-10-20 NOTE — Progress Notes (Signed)
RT attempted to wean pt on 5/5. Patient did not tolerate due to decrease RR. RT returned to full support. RN aware and will attempt again later. RT will continue to monitor.

## 2015-10-20 NOTE — Progress Notes (Signed)
PULMONARY / CRITICAL CARE MEDICINE   Name: Francisco [name] MRN: [id number] DOB: 1946/07/03    ADMISSION DATE:  10/17/2015 CONSULTATION DATE:  10/19/15  REFERRING MD:  Josem Kaufmann  CHIEF COMPLAINT:  Hypoxic respiratory failure  HISTORY OF PRESENT ILLNESS:  Francisco [name] is a 70 y.o. male with PMH of HTN, chronic back pain, GERD, DM. He is s/p lap chole with neg IOC which was completed without complication (done 10/18/15).  On 02/06, he had fever to 102.2.  Later that night, he had change in mental status with increased somnolence, decreased responsiveness, mod respiratory distress, hypoxic resp failure.   SUBJECTIVE:  Intubated overnight, tachycardic, and febrile Sepsis protocol initiated.   VITAL SIGNS: BP 121/76 mmHg  Pulse 114  Temp(Src) 101.5 F (38.6 C) (Oral)  Resp 19  Ht  (1.6 m)  Wt 160 lb 7.9 oz (72.8 kg)  BMI 28.44 kg/m2  SpO2 100%  HEMODYNAMICS:    VENTILATOR SETTINGS: Vent Mode:  [-] PRVC FiO2 (%):  [80 %-100 %] 80 % Set Rate:  [16 bmp-18 bmp] 18 bmp Vt Set:  [450 mL] 450 mL PEEP:  [5 cmH20] 5 cmH20 Plateau Pressure:  [15 cmH20] 15 cmH20  INTAKE / OUTPUT: I/O last 3 completed shifts: In: 499.1 [P.O.:240; I.V.:59.1; IV Piggyback:200] Out: 1510 [Urine:1310; Emesis/NG output:200]   PHYSICAL EXAMINATION:  General: Unresponsive Neuro: Sedated HEENT: ETT, OGT Cardiovascular: Tachycardia, regular rhythm, no M/R/G.  Lungs: Coarse in bases bilaterally. Abdomen: BS hypoactive, abd soft, NT/ND.  Incision dressings C/D/I. Musculoskeletal: No gross deformities, no edema.  Skin: Intact, warm, no rashes.  LABS:  BMET  Recent Labs Lab 10/19/15 1718 10/20/15 0050 10/20/15 0222  NA 136 138 139  K 3.8 3.9 3.4*  CL 99* 104 103  CO2 BUN 24* 21* 21*  CREATININE 1.39* 1.36* 1.46*  GLUCOSE 196* 178* 170*    Electrolytes  Recent Labs Lab 10/19/15 1718 10/20/15 0050 10/20/15 0222  CALCIUM 8.1* 7.9* 8.1*  MG  --  1.9  --   PHOS  --  1.2*  --      CBC  Recent Labs Lab 10/19/15 1718 10/20/15 0050 10/20/15 0222  WBC 12.3* 9.0 12.0*  HGB 11.9* 11.6* 12.3*  HCT 35.9* 35.3* 38.7*  PLT 206 199 225    Coag's  Recent Labs Lab 10/20/15 0222  APTT 33  INR 1.33    Sepsis Markers  Recent Labs Lab 10/17/15 0344 10/20/15 0050 10/20/15 0222 10/20/15 0430  LATICACIDVEN 1.59 1.6  --  1.9  PROCALCITON  --  12.23 15.02  --     ABG  Recent Labs Lab 10/19/15 2024 10/19/15 2245 10/20/15 0136  PHART 7.345* 7.326* 7.261*  PCO2ART 46.4* 45.7* 50.7*  PO2ART 48.9* 65.0* 135.0*    Liver Enzymes  Recent Labs Lab 10/19/15 0553 10/19/15 1718 10/20/15 0222  AST 65* 58* 56*  ALT 49 44 42  ALKPHOS 70 72 79  BILITOT 2.6* 2.2* 3.7*  ALBUMIN 2.6* 2.7* 2.7*    Cardiac Enzymes  Recent Labs Lab 10/19/15 1649 10/20/15 0050  TROPONINI 0.04* 0.06*    Glucose  Recent Labs Lab 10/19/15 0807 10/19/15 1152 10/19/15 1619 10/19/15 1957 10/20/15 0129 10/20/15 0337  GLUCAP 119* 155* 119* 159* 169* 165*    Imaging Ct Angio Chest Pe W/cm &/or Wo Cm  10/19/2015  CLINICAL DATA:  Acute onset of shortness of breath and confusion. Status post recent cholecystectomy. Initial encounter. EXAM: CT ANGIOGRAPHY CHEST WITH CONTRAST TECHNIQUE: Multidetector CT imaging of the  chest was performed using the standard protocol during bolus administration of intravenous contrast. Multiplanar CT image reconstructions and MIPs were obtained to evaluate the vascular anatomy. CONTRAST:  75mL OMNIPAQUE IOHEXOL 350 MG/ML SOLN COMPARISON:  Chest radiograph performed earlier today at 5:32 p.m. FINDINGS: There is no evidence of pulmonary embolus. Patchy bibasilar airspace opacity is noted, worse on the left, which may reflect postoperative atelectasis or pneumonia. A trace right pleural effusion is seen. No pneumothorax is identified. No masses are identified; no abnormal focal contrast enhancement is seen. The esophagus is largely filled with fluid,  likely reflecting esophageal dysmotility. A 1.5 cm azygoesophageal recess node is seen. There is obscuration of bronchioles to the medial right lower lobe at the inferior right hilum, with vague associated soft tissue density. Though this could reflect sequelae of aspiration, a poorly characterized malignancy is a concern. No pericardial effusion is identified. The great vessels are grossly unremarkable in appearance. No axillary lymphadenopathy is seen. The visualized portions of the thyroid gland are unremarkable in appearance. There is borderline aneurysmal dilatation of the ascending thoracic aorta to 4.1 cm in maximal AP dimension. The visualized portions of the liver and spleen are unremarkable. Note is made of a collection of fluid and air at the gallbladder fossa, measuring approximately 7.1 x 3.4 x 4.2 cm, raising concern for a bile leak. Would correlate with the patient's symptoms. No acute osseous abnormalities are seen. There is mild developmental wedging of vertebral bodies at the lower thoracic spine. Review of the MIP images confirms the above findings. IMPRESSION: 1. No evidence of pulmonary embolus. 2. Patchy bibasilar airspace opacities, worse on the left, which may reflect postoperative atelectasis or pneumonia. Given clinical concern for aspiration, this could reflect aspiration pneumonia. Trace right pleural effusion seen. 3. Collection of fluid and air at the gallbladder fossa, measuring 7.1 x 3.4 x 4.2 cm, raising concern for bile leak. Would correlate with the patient's symptoms, and consider HIDA scan for further evaluation. 4. Obscuration of bronchioles to the medial right lower lobe at the inferior right hilum, with vague soft tissue density and an adjacent 1.5 cm azygoesophageal recess node. Though this could reflect sequelae of aspiration, a poorly characterized bronchogenic malignancy is a concern. PET/CT would be helpful for further evaluation. 5. Esophagus largely filled with fluid,  likely reflecting esophageal dysmotility. 6. 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 Recommend annual imaging followup by CTA or MRA. This recommendation follows 2010 ACCF/AHA/AATS/ACR/ASA/SCA/SCAI/SIR/STS/SVM Guidelines for the Diagnosis and Management of Patients with Thoracic Aortic Disease. Circulation. 2010; 121: H846-N629 These results were called by telephone at the time of interpretation on 10/19/2015 at 10:32 pm to Dr. Johna Roles, who verbally acknowledged these results. Electronically Signed   By: Roanna Raider M.D.   On: 10/19/2015 22:37   Dg Chest Port 1 View  10/20/2015  CLINICAL DATA:  70 year old male status post intubation. EXAM: PORTABLE CHEST 1 VIEW COMPARISON:  Chest CT dated 10/19/2015 FINDINGS: Endotracheal tube the tip approximately 3.5 cm above the carina. An enteric tube is noted with tip in the left upper abdomen. There are bibasilar airspace densities corresponding to the opacity seen on the recent CT. There is blunting the left costophrenic angle. No pneumothorax. Top-normal cardiac size. No acute osseous pathology. IMPRESSION: Endotracheal tube above the carina. Bibasilar airspace opacities. Electronically Signed   By: Elgie Collard M.D.   On: 10/20/2015 01:25   Dg Chest Port 1 View  10/19/2015  CLINICAL DATA:  Confusion. Altered mental status. Chest and abdominal pain. EXAM: PORTABLE CHEST 1 VIEW COMPARISON:  10/17/2015 FINDINGS: Very low lung volumes with bibasilar opacities, likely atelectasis. Mild cardiomegaly. Possible small effusions. No acute bony abnormality. IMPRESSION: Low lung volumes, bibasilar atelectasis and possible small effusions. Electronically Signed   By: Charlett Nose M.D.   On: 10/19/2015 17:43   Dg Abd Portable 2v  10/19/2015  CLINICAL DATA:  Confusion, altered mental status P EXAM: PORTABLE ABDOMEN - 2 VIEW COMPARISON:  08/18/2012 FINDINGS: Mild centralized gaseous distention of bowel, both large and  small bowel, possibly mild ileus. Prior cholecystectomy. No free air organomegaly. IMPRESSION: Mild diffuse gaseous distention of bowel, possibly mild ileus. Prior cholecystectomy. Electronically Signed   By: Charlett Nose M.D.   On: 10/19/2015 17:44     STUDIES:  CT A / P 02/04 > acute cholecystitis. CTA chest 02/06 > no PE.  Bibasilar opacities L > R raising concern for aspiration.  Trace right effusion.  Collection of fluid and air at gallbladder fossa raising question of bile leak.  CXR 02/06 > bibasilar atx, low volumes.  CULTURES: Blood 02/04 > NG x2 days Repeat Blood 2/7> Sputum 02/06 >  ANTIBIOTICS: Zosyn 02/04 >  Vanc 02/06 > Diflucan 02/06 >  SIGNIFICANT EVENTS: 02/04 > admitted with abd pain due to cholecystitis. 02/05 > lap chole. 02/06 > fever spike > resp distress > hypoxia > transfer to ICU.  LINES/TUBES: ETT 02/06 >   ASSESSMENT / PLAN:  PULMONARY A: Acute hypoxic respiratory failure - likely multifactorial due to aspiration PNA, atx, splinting from pain, ? Narcotics. Aspiration PNA. P:   Full vent support. Wean as able. VAP prevention measures. SBT this AM Continue empiric abx, follow cultures. DuoNebs / Albuterol.  CARDIOVASCULAR A:  Hx HTN. P:  Monitor hemodynamics. Assess lactate, troponins.  RENAL A:   AKI. Pseudohypocalcemia - corrects to 9.14. P:   NS @ 100. Assess ionized calcium. BMP in AM. Allow pos balance  GASTROINTESTINAL A:   Acute cholecystitis - s/p lap chole 02/05. Collection of fluid and air at gallbladder fossa raising question of bile leak - suspect anticipated / normal post op findings. GERD. Nutrition. Hx SBO. P:   Post op care per [facility]. SUP: Pantoprazole. NPO. Gen surg following  HEMATOLOGIC A:   Mild anemia. - Improved VTE Prophylaxis. P:  Transfuse for Hgb < 7. SCD's / Heparin. CBC in AM.  INFECTIOUS A:   Aspiration PNA. At risk for bile leak - vs anticipated post operative changes. P:   Abx  as above (Vanc / Zosyn / Diflucan).  Follow cultures  PCT algorithm to limit abx exposure.  ENDOCRINE A:   DM.  P:   SSI. Hold outpatient metformin.  NEUROLOGIC A:   Acute encephalopathy - possibly infectious vs narcotics. Hx back pain, DJD, scoliosis. P:   Sedation:  Fentanyl gtt / Midazolam PRN. RASS goal: 0 to -1. Daily WUA. D/c flexeril, oxycodone.  Family updated: Family(son) updated.  Interdisciplinary Family Meeting v Palliative Care Meeting:  Due by: 02/12.  Caryl Ada, DO 10/20/2015, 7:17 AM PGY-2,  Family Medicine

## 2015-10-20 NOTE — Care Management Note (Addendum)
Case Management Note  Patient Details  Name: Francisco [name] MRN: [id number] Date of Birth: 06-22-1946  Subjective/Objective:     He is s/p lap chole with neg IOC which was completed without complication (done 10/18/15). On 02/06, he had fever to 102.2. Later that night, he had change in mental status with increased somnolence, decreased responsiveness, mod respiratory distress, hypoxic resp failure.   Action/Plan:  Pt is independent from home.  Patient speaks Nepali-Hindi.     Expected Discharge Date:                  Expected Discharge Plan:  Home/Self Care  In-House Referral:     Discharge planning Services  CM Consult  Post Acute Care Choice:    Choice offered to:     DME Arranged:    DME Agency:     HH Arranged:    HH Agency:     Status of Service:  In process, will continue to follow  Medicare Important Message Given:    Date Medicare IM Given:    Medicare IM give by:    Date Additional Medicare IM Given:    Additional Medicare Important Message give by:     If discussed at Long Length of Stay Meetings, dates discussed:    Additional Comments:  Cherylann Parr, RN 10/20/2015, 2:57 PM

## 2015-10-20 NOTE — Progress Notes (Signed)
Inpatient Diabetes Program Recommendations  AACE/ADA: New Consensus Statement on Inpatient Glycemic Control (2015)  Target Ranges:  Prepandial:   less than 140 mg/dL      Peak postprandial:   less than 180 mg/dL (1-2 hours)      Critically ill patients:  140 - 180 mg/dL   Review of Glycemic Control  Diabetes history: "Recent diagnosis of Diabetes" Outpatient Diabetes medications: Metformin 500 mg bid Current orders for Inpatient glycemic control: moderate correction scale q 4 hrs.  Inpatient Diabetes Program Recommendations:    Noted HgbA1C of 9.5% on 10/17/2015. Patient will need education and most probably additional medication to control glucose at home. Recommend patient go to OP education at discharge. Will need a dietician consult and in-patient education once stable and transferred to the floor. Patient will need follow-up plan with MD following discharge. Will see patient once appropriate.  Thank you Lenor Coffin, RN, MSN, CDE  Diabetes Inpatient Program Office: [phone] Pager: [phone] 8:00 am to 5:00 pm

## 2015-10-20 NOTE — Progress Notes (Signed)
Pt transferred to 2 M with RR RN escort along with floor RN Byrd Hesselbach and CNA. Intubated upon arrival per Dr. Tyson Alias. Bedside report provided per floor RN. Care assumed per ICU RN. New PIV started in R Hand 20 gauge.

## 2015-10-20 NOTE — Procedures (Signed)
High risk asp   i placed left nares NGT prior to intubation Green fluid suctioned back to reduce risk   Tolerated   Mcarthur Rossetti. Tyson Alias, MD, FACP Pgr: [phone] [facility]

## 2015-10-20 NOTE — Procedures (Signed)
Intubation Procedure Note Francisco [name] [id number] 23-Oct-1945  Procedure: Intubation Indications: Respiratory insufficiency  Procedure Details Consent: Unable to obtain consent because of emergent medical necessity. Time Out: Verified patient identification, verified procedure, site/side was marked, verified correct patient position, special equipment/implants available, medications/allergies/relevent history reviewed, required imaging and test results available.  Performed  Maximum sterile technique was used including cap, gloves, gown, hand hygiene and mask.  MAC and 3    Evaluation Hemodynamic Status: BP stable throughout; O2 sats: stable throughout Patient's Current Condition: stable Complications: No apparent complications Patient did tolerate procedure well. Chest X-ray ordered to verify placement.  CXR: pending.   Francisco [name]. 10/20/2015   Could see active green aspiration which we suctioned prior with NGT  Glide  Francisco [name]. Tyson Alias, MD, FACP Pgr: [phone] [facility]

## 2015-10-21 ENCOUNTER — Inpatient Hospital Stay (HOSPITAL_COMMUNITY): Payer: Medicare Other

## 2015-10-21 LAB — CBC
HCT: 28 % — ABNORMAL LOW (ref 39.0–52.0)
HCT: 30.1 % — ABNORMAL LOW (ref 39.0–52.0)
HEMOGLOBIN: 9.1 g/dL — AB (ref 13.0–17.0)
Hemoglobin: 9.6 g/dL — ABNORMAL LOW (ref 13.0–17.0)
MCH: 27.6 pg (ref 26.0–34.0)
MCH: 27.4 pg (ref 26.0–34.0)
MCHC: 32.5 g/dL (ref 30.0–36.0)
MCHC: 31.9 g/dL (ref 30.0–36.0)
MCV: 84.8 fL (ref 78.0–100.0)
MCV: 86 fL (ref 78.0–100.0)
PLATELETS: 172 10*3/uL (ref 150–400)
PLATELETS: 214 10*3/uL (ref 150–400)
RBC: 3.3 MIL/uL — AB (ref 4.22–5.81)
RBC: 3.5 MIL/uL — AB (ref 4.22–5.81)
RDW: 14.6 % (ref 11.5–15.5)
RDW: 15 % (ref 11.5–15.5)
WBC: 11.5 10*3/uL — AB (ref 4.0–10.5)
WBC: 12.9 10*3/uL — ABNORMAL HIGH (ref 4.0–10.5)

## 2015-10-21 LAB — COMPREHENSIVE METABOLIC PANEL
ALBUMIN: 1.9 g/dL — AB (ref 3.5–5.0)
ALK PHOS: 64 U/L (ref 38–126)
ALT: 27 U/L (ref 17–63)
AST: 32 U/L (ref 15–41)
Anion gap: 8 (ref 5–15)
BUN: 15 mg/dL (ref 6–20)
CHLORIDE: 110 mmol/L (ref 101–111)
CO2: 23 mmol/L (ref 22–32)
CREATININE: 1.19 mg/dL (ref 0.61–1.24)
Calcium: 7.2 mg/dL — ABNORMAL LOW (ref 8.9–10.3)
GFR calc Af Amer: >60 mL/min (ref >60)
GFR calc non Af Amer: >60 mL/min (ref >60)
Glucose, Bld: 117 mg/dL — ABNORMAL HIGH (ref 65–99)
Potassium: 3.3 mmol/L — ABNORMAL LOW (ref 3.5–5.1)
SODIUM: 141 mmol/L (ref 135–145)
TOTAL PROTEIN: 5 g/dL — AB (ref 6.5–8.1)
Total Bilirubin: 2.6 mg/dL — ABNORMAL HIGH (ref 0.3–1.2)

## 2015-10-21 LAB — POCT I-STAT 3, ART BLOOD GAS (G3+)
Acid-base deficit: 1 mmol/L (ref 0.0–2.0)
BICARBONATE: 23.3 meq/L (ref 20.0–24.0)
O2 Saturation: 94 %
PCO2 ART: 38.6 mmHg (ref 35.0–45.0)
PO2 ART: 77 mmHg — AB (ref 80.0–100.0)
TCO2: 24 mmol/L (ref 0–100)
pH, Arterial: 7.397 (ref 7.350–7.450)

## 2015-10-21 LAB — PROCALCITONIN: PROCALCITONIN: 14.62 ng/mL

## 2015-10-21 LAB — CALCIUM, IONIZED: CALCIUM, IONIZED, SERUM: 4.4 mg/dL — AB (ref 4.5–5.6)

## 2015-10-21 LAB — GLUCOSE, CAPILLARY
GLUCOSE-CAPILLARY: 119 mg/dL — AB (ref 65–99)
GLUCOSE-CAPILLARY: 136 mg/dL — AB (ref 65–99)
GLUCOSE-CAPILLARY: 147 mg/dL — AB (ref 65–99)
GLUCOSE-CAPILLARY: 170 mg/dL — AB (ref 65–99)
Glucose-Capillary: 159 mg/dL — ABNORMAL HIGH (ref 65–99)
Glucose-Capillary: 102 mg/dL — ABNORMAL HIGH (ref 65–99)

## 2015-10-21 LAB — MAGNESIUM: MAGNESIUM: 1.9 mg/dL (ref 1.7–2.4)

## 2015-10-21 LAB — PHOSPHORUS: Phosphorus: 1.5 mg/dL — ABNORMAL LOW (ref 2.5–4.6)

## 2015-10-21 MED ORDER — POTASSIUM PHOSPHATES 15 MMOLE/5ML IV SOLN
40.0000 meq | Freq: Once | INTRAVENOUS | Status: AC
  Administered 2015-10-21: 40 meq via INTRAVENOUS
  Filled 2015-10-21: qty 9.09

## 2015-10-21 MED ORDER — ONDANSETRON HCL 4 MG PO TABS: 4.0000 mg | ORAL_TABLET | ORAL | Status: DC | PRN

## 2015-10-21 MED ORDER — ACETAMINOPHEN 10 MG/ML IV SOLN
1000.0000 mg | Freq: Four times a day (QID) | INTRAVENOUS | Status: AC
  Administered 2015-10-21 – 2015-10-22 (×4): 1000 mg via INTRAVENOUS
  Filled 2015-10-21 (×4): qty 100

## 2015-10-21 MED ORDER — IPRATROPIUM-ALBUTEROL 0.5-2.5 (3) MG/3ML IN SOLN
3.0000 mL | Freq: Three times a day (TID) | RESPIRATORY_TRACT | Status: DC
  Administered 2015-10-22 – 2015-10-23 (×4): 3 mL via RESPIRATORY_TRACT
  Filled 2015-10-21 (×4): qty 3

## 2015-10-21 MED ORDER — WHITE PETROLATUM GEL
Status: AC
  Administered 2015-10-21: 0.2
  Filled 2015-10-21: qty 1

## 2015-10-21 MED ORDER — ONDANSETRON HCL 4 MG/2ML IJ SOLN
4.0000 mg | INTRAMUSCULAR | Status: DC | PRN
  Administered 2015-10-23: 4 mg via INTRAVENOUS
  Filled 2015-10-21: qty 2

## 2015-10-21 NOTE — Progress Notes (Signed)
3 Days Post-Op  Subjective: Intubated, sedated  Objective: Vital signs in last 24 hours: Temp:  [99.3 F (37.4 C)-101.7 F (38.7 C)] 100.9 F (38.3 C) (02/08 0630) Pulse Rate:  [91-135] 103 (02/08 0726) Resp:  [6-22] 20 (02/08 0726) BP: (76-127)/(53-86) 100/67 mmHg (02/08 0726) SpO2:  [96 %-100 %] 100 % (02/08 0726) FiO2 (%):  [40 %-60 %] 40 % (02/08 0726) Weight:  [80.3 kg (177 lb 0.5 oz)] 80.3 kg (177 lb 0.5 oz) (02/08 0500) Last BM Date: 10/16/15  Intake/Output from previous day: 02/07 0701 - 02/08 0700 In: 4025.6 [I.V.:2491.5; NG/GT:475; IV Piggyback:1059.1] Out: 805 [Urine:680; Emesis/NG output:125] Intake/Output this shift:    GI: soft incisions clean  Lab Results:   Recent Labs  10/20/15 0222 10/21/15 0250  WBC 12.0* 11.5*  HGB 12.3* 9.1*  HCT 38.7* 28.0*  PLT 225 172   BMET  Recent Labs  10/20/15 0222 10/21/15 0250  NA 139 141  K 3.4* 3.3*  CL 103 110  CO2 23 23  GLUCOSE 170* 117*  BUN 21* 15  CREATININE 1.46* 1.19  CALCIUM 8.1* 7.2*   PT/INR  Recent Labs  10/20/15 0222  LABPROT 16.6*  INR 1.33   ABG  Recent Labs  10/19/15 2245 10/20/15 0136  PHART 7.326* 7.261*  HCO3 22.9 22.2    Studies/Results: Ct Angio Chest Pe W/cm &/or Wo Cm  10/19/2015  CLINICAL DATA:  Acute onset of shortness of breath and confusion. Status post recent cholecystectomy. Initial encounter. EXAM: CT ANGIOGRAPHY CHEST WITH CONTRAST TECHNIQUE: Multidetector CT imaging of the chest was performed using the standard protocol during bolus administration of intravenous contrast. Multiplanar CT image reconstructions and MIPs were obtained to evaluate the vascular anatomy. CONTRAST:  75mL OMNIPAQUE IOHEXOL 350 MG/ML SOLN COMPARISON:  Chest radiograph performed earlier today at 5:32 p.m. FINDINGS: There is no evidence of pulmonary embolus. Patchy bibasilar airspace opacity is noted, worse on the left, which may reflect postoperative atelectasis or pneumonia. A trace right  pleural effusion is seen. No pneumothorax is identified. No masses are identified; no abnormal focal contrast enhancement is seen. The esophagus is largely filled with fluid, likely reflecting esophageal dysmotility. A 1.5 cm azygoesophageal recess node is seen. There is obscuration of bronchioles to the medial right lower lobe at the inferior right hilum, with vague associated soft tissue density. Though this could reflect sequelae of aspiration, a poorly characterized malignancy is a concern. No pericardial effusion is identified. The great vessels are grossly unremarkable in appearance. No axillary lymphadenopathy is seen. The visualized portions of the thyroid gland are unremarkable in appearance. There is borderline aneurysmal dilatation of the ascending thoracic aorta to 4.1 cm in maximal AP dimension. The visualized portions of the liver and spleen are unremarkable. Note is made of a collection of fluid and air at the gallbladder fossa, measuring approximately 7.1 x 3.4 x 4.2 cm, raising concern for a bile leak. Would correlate with the patient's symptoms. No acute osseous abnormalities are seen. There is mild developmental wedging of vertebral bodies at the lower thoracic spine. Review of the MIP images confirms the above findings. IMPRESSION: 1. No evidence of pulmonary embolus. 2. Patchy bibasilar airspace opacities, worse on the left, which may reflect postoperative atelectasis or pneumonia. Given clinical concern for aspiration, this could reflect aspiration pneumonia. Trace right pleural effusion seen. 3. Collection of fluid and air at the gallbladder fossa, measuring 7.1 x 3.4 x 4.2 cm, raising concern for bile leak. Would correlate with the patient's symptoms, and  consider HIDA scan for further evaluation. 4. Obscuration of bronchioles to the medial right lower lobe at the inferior right hilum, with vague soft tissue density and an adjacent 1.5 cm azygoesophageal recess node. Though this could reflect  sequelae of aspiration, a poorly characterized bronchogenic malignancy is a concern. PET/CT would be helpful for further evaluation. 5. Esophagus largely filled with fluid, likely reflecting esophageal dysmotility. 6. 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 Recommend annual imaging followup by CTA or MRA. This recommendation follows 2010 ACCF/AHA/AATS/ACR/ASA/SCA/SCAI/SIR/STS/SVM Guidelines for the Diagnosis and Management of Patients with Thoracic Aortic Disease. Circulation. 2010; 121: Z610-R604 These results were called by telephone at the time of interpretation on 10/19/2015 at 10:32 pm to Dr. Johna Roles, who verbally acknowledged these results. Electronically Signed   By: Roanna Raider M.D.   On: 10/19/2015 22:37   Dg Chest Port 1 View  10/21/2015  CLINICAL DATA:  Ventilator dependent EXAM: PORTABLE CHEST 1 VIEW COMPARISON:  10/20/2015 FINDINGS: Endotracheal tube just above the carina. This could be withdrawn 3 cm. NG tube in the stomach. Decreased lung volume compared with the prior study with increase in bibasilar atelectasis. Small left effusion. IMPRESSION: Endotracheal tube at the carina, recommend withdrawal 3 cm Progression of bibasilar atelectasis with decreased lung volume since yesterday. Electronically Signed   By: Marlan Palau M.D.   On: 10/21/2015 07:55   Dg Chest Port 1 View  10/20/2015  CLINICAL DATA:  70 year old male status post intubation. EXAM: PORTABLE CHEST 1 VIEW COMPARISON:  Chest CT dated 10/19/2015 FINDINGS: Endotracheal tube the tip approximately 3.5 cm above the carina. An enteric tube is noted with tip in the left upper abdomen. There are bibasilar airspace densities corresponding to the opacity seen on the recent CT. There is blunting the left costophrenic angle. No pneumothorax. Top-normal cardiac size. No acute osseous pathology. IMPRESSION: Endotracheal tube above the carina. Bibasilar airspace opacities.  Electronically Signed   By: Elgie Collard M.D.   On: 10/20/2015 01:25   Dg Chest Port 1 View  10/19/2015  CLINICAL DATA:  Confusion. Altered mental status. Chest and abdominal pain. EXAM: PORTABLE CHEST 1 VIEW COMPARISON:  10/17/2015 FINDINGS: Very low lung volumes with bibasilar opacities, likely atelectasis. Mild cardiomegaly. Possible small effusions. No acute bony abnormality. IMPRESSION: Low lung volumes, bibasilar atelectasis and possible small effusions. Electronically Signed   By: Charlett Nose M.D.   On: 10/19/2015 17:43   Dg Abd Portable 2v  10/19/2015  CLINICAL DATA:  Confusion, altered mental status P EXAM: PORTABLE ABDOMEN - 2 VIEW COMPARISON:  08/18/2012 FINDINGS: Mild centralized gaseous distention of bowel, both large and small bowel, possibly mild ileus. Prior cholecystectomy. No free air organomegaly. IMPRESSION: Mild diffuse gaseous distention of bowel, possibly mild ileus. Prior cholecystectomy. Electronically Signed   By: Charlett Nose M.D.   On: 10/19/2015 17:44    Anti-infectives: Anti-infectives    Start     Dose/Rate Route Frequency Ordered Stop   10/20/15 2300  fluconazole (DIFLUCAN) IVPB 200 mg     200 mg 100 mL/hr over 60 Minutes Intravenous Every 24 hours 10/20/15 0012     10/20/15 0015  vancomycin (VANCOCIN) IVPB 750 mg/150 ml premix     750 mg 150 mL/hr over 60 Minutes Intravenous 2 times daily 10/20/15 0012     10/20/15 0015  fluconazole (DIFLUCAN) IVPB 400 mg     400 mg 100 mL/hr over 120 Minutes Intravenous  Once 10/20/15 0012 10/20/15 0521  10/19/15 1400  piperacillin-tazobactam (ZOSYN) IVPB 3.375 g     3.375 g 12.5 mL/hr over 240 Minutes Intravenous 3 times per day 10/19/15 1230     10/17/15 1400  piperacillin-tazobactam (ZOSYN) IVPB 3.375 g  Status:  Discontinued     3.375 g 12.5 mL/hr over 240 Minutes Intravenous 3 times per day 10/17/15 1334 10/19/15 1116   10/17/15 0600  piperacillin-tazobactam (ZOSYN) IVPB 3.375 g     3.375 g 100 mL/hr over 30  Minutes Intravenous  Once 10/17/15 0553 10/17/15 0743      Assessment/Plan: Acute cholecystitis  POD #3 s/p lap chole with NEG IOC---Dr. Sheliah Hatch ABLA-monitor hb, will recheck later today Acute hypoxic respiratory failure-full vent support, atbx, nebs ID-Zosyn D#4, Vanc D#2 for presumed aspiration PNA VTE prophylaxis-SCD/heparin GI on tube feeds, bili decreasing Dispo-ICU  [facility] 10/21/2015

## 2015-10-21 NOTE — Progress Notes (Signed)
PULMONARY / CRITICAL CARE MEDICINE   Name: Francisco [name] MRN: [id number] DOB: 1946/08/02    ADMISSION DATE:  10/17/2015 CONSULTATION DATE:  10/19/15  REFERRING MD:  Josem Kaufmann  CHIEF COMPLAINT:  Hypoxic respiratory failure  HISTORY OF PRESENT ILLNESS:  Francisco [name] is a 70 y.o. male with PMH of HTN, chronic back pain, GERD, DM. He is s/p lap chole with neg IOC which was completed without complication (done 10/18/15).  On 02/06, he had fever to 102.2.  Later that night, he had change in mental status with increased somnolence, decreased responsiveness, mod respiratory distress, hypoxic resp failure.   SUBJECTIVE:  No overnight events. Patient more agitated and tried to self-extubate  VITAL SIGNS: BP 116/68 mmHg  Pulse 117  Temp(Src) 100.6 F (38.1 C) (Oral)  Resp 18  Ht  (1.6 m)  Wt 177 lb 0.5 oz (80.3 kg)  BMI 31.37 kg/m2  SpO2 96%  HEMODYNAMICS:    VENTILATOR SETTINGS: Vent Mode:  [-] PRVC FiO2 (%):  [40 %-60 %] 40 % Set Rate:  [18 bmp] 18 bmp Vt Set:  [450 mL] 450 mL PEEP:  [5 cmH20] 5 cmH20 Pressure Support:  [5 cmH20] 5 cmH20 Plateau Pressure:  [18 cmH20-23 cmH20] 18 cmH20  INTAKE / OUTPUT: I/O last 3 completed shifts: In: 4093.2 [P.O.:120; I.V.:2664.1; NG/GT:100; IV Piggyback:1209.1] Out: 1660 [Urine:1460; Emesis/NG output:200]   PHYSICAL EXAMINATION: General: Agitated Neuro: Arousable, follows commands HEENT: ETT, OGT Cardiovascular: Tachycardia, regular rhythm, no M/R/G.  Lungs: Coarse in bases bilaterally. Abdomen: BS hypoactive, NT/ND.  Incision dressings C/D/I. Musculoskeletal: No gross deformities, no edema.  Skin: Intact, warm, no rashes.  LABS:  BMET  Recent Labs Lab 10/20/15 0050 10/20/15 0222 10/21/15 0250  NA 138 139 141  K 3.9 3.4* 3.3*  CL 104 103 110  CO2 BUN 21* 21* 15  CREATININE 1.36* 1.46* 1.19  GLUCOSE 178* 170* 117*    Electrolytes  Recent Labs Lab 10/20/15 0050 10/20/15 0222 10/21/15 0250  CALCIUM 7.9*  8.1* 7.2*  MG 1.9  --  1.9  PHOS 1.2*  --  1.5*    CBC  Recent Labs Lab 10/20/15 0050 10/20/15 0222 10/21/15 0250  WBC 9.0 12.0* 11.5*  HGB 11.6* 12.3* 9.1*  HCT 35.3* 38.7* 28.0*  PLT 199 225 172    Coag's  Recent Labs Lab 10/20/15 0222  APTT 33  INR 1.33    Sepsis Markers  Recent Labs Lab 10/17/15 0344 10/20/15 0050 10/20/15 0222 10/20/15 0430 10/21/15 0250  LATICACIDVEN 1.59 1.6  --  1.9  --   PROCALCITON  --  12.23 15.02  --  14.62    ABG  Recent Labs Lab 10/19/15 2024 10/19/15 2245 10/20/15 0136  PHART 7.345* 7.326* 7.261*  PCO2ART 46.4* 45.7* 50.7*  PO2ART 48.9* 65.0* 135.0*    Liver Enzymes  Recent Labs Lab 10/19/15 1718 10/20/15 0222 10/21/15 0250  AST 58* 56* 32  ALT 44 42 27  ALKPHOS 72 79 64  BILITOT 2.2* 3.7* 2.6*  ALBUMIN 2.7* 2.7* 1.9*    Cardiac Enzymes  Recent Labs Lab 10/19/15 1649 10/20/15 0050  TROPONINI 0.04* 0.06*    Glucose  Recent Labs Lab 10/20/15 0337 10/20/15 0902 10/20/15 1129 10/20/15 1554 10/20/15 2018 10/21/15 0003  GLUCAP 165* 152* 155* 129* 102* 147*    Imaging No results found.   STUDIES:  CT A / P 02/04 > acute cholecystitis. CTA chest 02/06 > no PE.  Bibasilar opacities L > R raising concern for  aspiration.  Trace right effusion.  Collection of fluid and air at gallbladder fossa raising question of bile leak.  CXR 02/06 > bibasilar atx, low volumes.  CULTURES: Blood 02/04 > NG x3 days Repeat Blood 2/7> Sputum 02/06 >  ANTIBIOTICS: Zosyn 02/04 >  Vanc 02/06 > Diflucan 02/06 >  SIGNIFICANT EVENTS: 02/04 > admitted with abd pain due to cholecystitis. 02/05 > lap chole. 02/06 > fever spike > resp distress > hypoxia > transfer to ICU.  LINES/TUBES: ETT 02/06 > NGT 2/6> PIVs  ASSESSMENT / PLAN:  PULMONARY A: Acute hypoxic respiratory failure - likely multifactorial due to aspiration PNA, atx, splinting from pain, ? Narcotics. - Improving Aspiration PNA. P:   Full  vent support. Wean as able. Likely able to extubate today. VAP prevention measures. SBT Continue empiric abx, follow cultures. NGTD. DuoNebs / Albuterol.   CARDIOVASCULAR A:  Hx HTN. - Stable P:  Monitor hemodynamics. Assess lactate, troponins.  RENAL A:   AKI. - Imrpoved Pseudohypocalcemia - corrects to 9.14. P:   D/c fluids  BMP daily Allow pos balance Replacing electrolytes as needed. Kphos.  GASTROINTESTINAL A:   Acute cholecystitis - s/p lap chole 02/05. Collection of fluid and air at gallbladder fossa raising question of bile leak - suspect anticipated / normal post op findings. GERD. Nutrition. Hx SBO. P:   Post op care per CCS. SUP: Pantoprazole. Continue TF  HEMATOLOGIC A:   Anemia - No source of bleeding evident. Recent surgery. VTE Prophylaxis. P:  Transfuse for Hgb < 7. SCD's / Heparin. CBC daily All cell lines down likely dilutional component.  Monitor for bleeding   INFECTIOUS A:   Aspiration PNA. At risk for bile leak - vs anticipated post operative changes. P:   Abx as above (Vanc / Zosyn / Diflucan). Can likely deescalate tomorrow. Follow cultures - NGTD PCT algorithm to limit abx exposure. - slightly improved 15.02>14.62 (questionable reliability with recent surgery)  ENDOCRINE A:   DM.  P:   Mod SSI. Hold outpatient metformin.  NEUROLOGIC A:   Acute encephalopathy - possibly infectious vs narcotics. Hx back pain, DJD, scoliosis. P:   Sedation:  Fentanyl gtt / Midazolam PRN. RASS goal: 0 to -1. Daily WUA.   Family updated: Family(son) updated at bedside.  Interdisciplinary Family Meeting v Palliative Care Meeting:  Due by: 02/12.  Caryl Ada, DO 10/21/2015, 6:32 AM PGY-2, [facility]

## 2015-10-21 NOTE — Progress Notes (Signed)
eLink Physician-Brief Progress Note Patient Name: [name] DOB: 30-May-1946 MRN: [id number]   Date of Service  10/21/2015  HPI/Events of Note  Patient w/ projectile N/V. Combative with attempted NGT.  eICU Interventions  1. No NGT 2. Zofran prn     Intervention Category Intermediate Interventions: Other:  Lawanda Cousins 10/21/2015, 7:29 PM

## 2015-10-21 NOTE — Progress Notes (Signed)
eLink Physician-Brief Progress Note Patient Name: Francisco [name] DOB: 06/25/1946 MRN: [id number]   Date of Service  10/21/2015  HPI/Events of Note  Camera check post extubation. Patient resting comfortably & no respiratory distress.  eICU Interventions  Continue current plan of care.     Intervention Category Intermediate Interventions: Other:  Lawanda Cousins 10/21/2015, 9:53 PM

## 2015-10-21 NOTE — Care Management Note (Addendum)
Case Management Note  Patient Details  Name: Francisco [name] MRN: [id number] Date of Birth: 11/16/1945  Subjective/Objective:     He is s/p lap chole with neg IOC which was completed without complication (done 10/18/15). On 02/06, he had fever to 102.2. Later that night, he had change in mental status with increased somnolence, decreased responsiveness, mod respiratory distress, hypoxic resp failure.   Action/Plan:  Pt is independent from home with a large family.  Son at bedside and serves as Engineer, technical sales  during assessment, stated pt was completely independent prior to admit. Patient speaks Nepali-Hindi.     Expected Discharge Date:                  Expected Discharge Plan:  Home/Self Care  In-House Referral:     Discharge planning Services  CM Consult  Post Acute Care Choice:    Choice offered to:     DME Arranged:    DME Agency:     HH Arranged:    HH Agency:     Status of Service:  In process, will continue to follow  Medicare Important Message Given:    Date Medicare IM Given:    Medicare IM give by:    Date Additional Medicare IM Given:    Additional Medicare Important Message give by:     If discussed at Long Length of Stay Meetings, dates discussed:    Additional Comments:  Cherylann Parr, RN 10/21/2015, 10:18 AM

## 2015-10-21 NOTE — Procedures (Signed)
Extubation Procedure Note  Patient Details:   Name: Francisco [name] DOB: 1946-04-24 MRN: [id number]   Airway Documentation:     Evaluation  O2 sats: stable throughout Complications: No apparent complications Patient did tolerate procedure well. Bilateral Breath Sounds: Expiratory wheezes, Rhonchi Suctioning: Airway Yes   Patient extubated to 6L nasal cannula per MD order.  Positive cuff leak was noted.  Patient was able to speak post extubation.  Patient was noted to have audible expiratory wheezes but no evidence of stridor.  Gave patient PRN albuterol treatment.  Encouraged family member to encourage patient on effective cough and deep breathing.  MD aware.  Will continue to monitor patient.   Durwin Glaze 10/21/2015, 12:28 PM

## 2015-10-21 NOTE — Progress Notes (Signed)
Pt previously on Fentanyl drip (10 mcg/ml); 230 of 250 ml bag wasted in sink; witnessed by Hermelinda Medicus, RN.  Burnard Bunting, RN

## 2015-10-21 NOTE — Progress Notes (Signed)
Nutrition Consult  Received MD Consult for TF initiation and management. TF was initiated via Adult Tube Feeding Protocol on 2/7. Currently TF on hold for extubation. No nutrition intervention needed at this time. Please re-consult if nutrition concerns arise.   Joaquin Courts, RD, LDN, CNSC Pager [phone] After Hours Pager [phone]

## 2015-10-21 NOTE — Progress Notes (Signed)
Pt attempting to self extubate requiring increase in sedation.  Will continue to monitor.

## 2015-10-22 LAB — PROCALCITONIN: PROCALCITONIN: 6.91 ng/mL

## 2015-10-22 LAB — CULTURE, RESPIRATORY W GRAM STAIN

## 2015-10-22 LAB — MAGNESIUM: MAGNESIUM: 2.2 mg/dL (ref 1.7–2.4)

## 2015-10-22 LAB — BASIC METABOLIC PANEL
ANION GAP: 11 (ref 5–15)
BUN: 14 mg/dL (ref 6–20)
CALCIUM: 8.2 mg/dL — AB (ref 8.9–10.3)
CHLORIDE: 111 mmol/L (ref 101–111)
CO2: 22 mmol/L (ref 22–32)
CREATININE: 1.05 mg/dL (ref 0.61–1.24)
GFR calc non Af Amer: >60 mL/min (ref >60)
Glucose, Bld: 119 mg/dL — ABNORMAL HIGH (ref 65–99)
Potassium: 3.8 mmol/L (ref 3.5–5.1)
SODIUM: 144 mmol/L (ref 135–145)

## 2015-10-22 LAB — CULTURE, BLOOD (ROUTINE X 2)
CULTURE: NO GROWTH
CULTURE: NO GROWTH

## 2015-10-22 LAB — CBC
HCT: 31.2 % — ABNORMAL LOW (ref 39.0–52.0)
HEMOGLOBIN: 10.5 g/dL — AB (ref 13.0–17.0)
MCH: 28.5 pg (ref 26.0–34.0)
MCHC: 33.7 g/dL (ref 30.0–36.0)
MCV: 84.8 fL (ref 78.0–100.0)
PLATELETS: 266 10*3/uL (ref 150–400)
RBC: 3.68 MIL/uL — AB (ref 4.22–5.81)
RDW: 14.8 % (ref 11.5–15.5)
WBC: 16.1 10*3/uL — AB (ref 4.0–10.5)

## 2015-10-22 LAB — GLUCOSE, CAPILLARY
GLUCOSE-CAPILLARY: 103 mg/dL — AB (ref 65–99)
GLUCOSE-CAPILLARY: 130 mg/dL — AB (ref 65–99)
Glucose-Capillary: 151 mg/dL — ABNORMAL HIGH (ref 65–99)
Glucose-Capillary: 124 mg/dL — ABNORMAL HIGH (ref 65–99)
Glucose-Capillary: 169 mg/dL — ABNORMAL HIGH (ref 65–99)

## 2015-10-22 LAB — CULTURE, RESPIRATORY

## 2015-10-22 LAB — PHOSPHORUS: Phosphorus: 2.3 mg/dL — ABNORMAL LOW (ref 2.5–4.6)

## 2015-10-22 MED ORDER — SODIUM CHLORIDE 0.9 % IV SOLN
3.0000 g | Freq: Four times a day (QID) | INTRAVENOUS | Status: DC
  Administered 2015-10-22 – 2015-10-25 (×14): 3 g via INTRAVENOUS
  Filled 2015-10-22 (×19): qty 3

## 2015-10-22 MED ORDER — INSULIN ASPART 100 UNIT/ML ~~LOC~~ SOLN
0.0000 [IU] | Freq: Three times a day (TID) | SUBCUTANEOUS | Status: DC
  Administered 2015-10-22: 3 [IU] via SUBCUTANEOUS
  Administered 2015-10-22 – 2015-10-23 (×2): 2 [IU] via SUBCUTANEOUS

## 2015-10-22 MED ORDER — CETYLPYRIDINIUM CHLORIDE 0.05 % MT LIQD
7.0000 mL | Freq: Two times a day (BID) | OROMUCOSAL | Status: DC
  Administered 2015-10-22 – 2015-10-26 (×8): 7 mL via OROMUCOSAL

## 2015-10-22 NOTE — Evaluation (Signed)
Clinical/Bedside Swallow Evaluation Patient Details  Name: Francisco [name] MRN: [id number] Date of Birth: 1945-11-08  Today's Date: 10/22/2015 Time: SLP Start Time (ACUTE ONLY): 1105 SLP Stop Time (ACUTE ONLY): 1127 SLP Time Calculation (min) (ACUTE ONLY): 22 min  Past Medical History:  Past Medical History  Diagnosis Date  . Hypertension   . Back pain   . DJD (degenerative joint disease)   . Scoliosis   . Bursitis   . GERD (gastroesophageal reflux disease)   . SBO (small bowel obstruction) (HCC) 08/17/2012  . Type 2 diabetes mellitus (HCC) 10/17/2015   Past Surgical History:  Past Surgical History  Procedure Laterality Date  . No past surgeries    . Cholecystectomy N/A 10/18/2015    Procedure: LAPAROSCOPIC CHOLECYSTECTOMY WITH INTRAOPERATIVE CHOLANGIOGRAM;  Surgeon: Rodman Pickle, MD;  Location: [facility];  Service: General;  Laterality: N/A;   HPI:  Francisco [name] is a 70 y.o. male with PMH of HTN, chronic back pain, GERD, DM. He is s/p lap chole with neg IOC which was completed without complication (done 10/18/15). On 02/06, he had fever to 102.2. Later that night, he had change in mental status with increased somnolence, decreased responsiveness, mod respiratory distress, hypoxic resp failure. CXR 2/6 with bibasilar opacities L > R, concerning for aspiration due to his esophagus full of fluid. Intubated 2/6-2/8.    Assessment / Plan / Recommendation Clinical Impression  Bedside swallow evaluation complete. Patient with what appears to be a functional oropharyngeal swallow without overt indication of aspiration with tested consistencies. Multiple risk factors for aspiration present however, including impulsivity with self feeding, increased RR/WOB which may impact coordination of respirations and swallow, and questionable history of esophageal deficits. Patient confused and a poor historian, even with son present to interpret however reports a h/o GER requiring "multiple times to the  hospital."  CXR 2/6 noted fluid filled esophagus which may have contributed to aspiration event and respiratory decline. Discussed above with patient, son, and Charity fundraiser. Recommend initiation of clear liquids only with full supervision for tolerance and use of compensatory strategies in addition to consideration of barium swallow to more formally evaluate esophageal function as if may relate to recurrance of aspiration. SLP will f/u at bedside for tolerance and need for additional testing.     Aspiration Risk  Moderate aspiration risk    Diet Recommendation Thin liquid (clear liquids)   Liquid Administration via: Cup;No straw Medication Administration: Whole meds with liquid Supervision: Patient able to self feed;Full supervision/cueing for compensatory strategies Compensations: Slow rate;Small sips/bites Postural Changes: Seated upright at 90 degrees;Remain upright for at least 30 minutes after po intake    Other  Recommendations Recommended Consults: Consider esophageal assessment Oral Care Recommendations: Oral care BID   Follow up Recommendations  None    Frequency and Duration min 2x/week  1 week       Prognosis Prognosis for Safe Diet Advancement: Good      Swallow Study   General HPI: Francisco [name] is a 70 y.o. male with PMH of HTN, chronic back pain, GERD, DM. He is s/p lap chole with neg IOC which was completed without complication (done 10/18/15). On 02/06, he had fever to 102.2. Later that night, he had change in mental status with increased somnolence, decreased responsiveness, mod respiratory distress, hypoxic resp failure. CXR 2/6 with bibasilar opacities L > R, concerning for aspiration due to his esophagus full of fluid. Intubated 2/6-2/8.  Type of Study: Bedside Swallow Evaluation Diet Prior to this Study: NPO  Temperature Spikes Noted: No Respiratory Status: Nasal cannula History of Recent Intubation: Yes Length of Intubations (days): 2 days Date extubated:  10/21/15 Behavior/Cognition: Alert;Cooperative;Pleasant mood;Impulsive Oral Cavity Assessment: Within Functional Limits Oral Care Completed by SLP: No Oral Cavity - Dentition: Adequate natural dentition (missing bottom dentition) Vision: Functional for self-feeding Self-Feeding Abilities: Able to feed self Patient Positioning: Upright in chair Baseline Vocal Quality: Normal Volitional Cough: Strong Volitional Swallow: Able to elicit    Oral/Motor/Sensory Function Overall Oral Motor/Sensory Function: Within functional limits   Ice Chips Ice chips: Not tested   Thin Liquid Thin Liquid: Within functional limits Presentation: Cup;Self Fed    Nectar Thick Nectar Thick Liquid: Not tested   Honey Thick Honey Thick Liquid: Not tested   Puree Puree: Within functional limits Presentation: Spoon   Solid   GO  Aki Burdin MA, CCC-SLP [phone]  Solid: Within functional limits        Torben Soloway Meryl 10/22/2015,1:18 PM

## 2015-10-22 NOTE — Progress Notes (Addendum)
Patient's family has expressed concern regarding the patient's response to eating first meal after surgery. Patient has c/o increased abdominal distention and discomfort. Patient reports that he has not yet passed gas but has had a bowel movement. Upon assessment, abdomen is firm, distended and tender. There are no bowel sounds present in any quadrant. [facility] was paged and they advised me they have not seen this patient. I have paged [facility]. New order placed. Will continue to monitor patient.

## 2015-10-22 NOTE — Progress Notes (Signed)
Patient ID: Francisco [name], male   DOB: 10/16/1945, 70 y.o.   MRN: [id number]     [address] [place], [place] [address]    Phone: [phone] FAX: [phone]     Subjective: Alert and awake.  Says a few words in English, mostly speaking in different language.  NAD.  Vomited TF yesterday.  Febrile 102 yesterday. BP stable. Awaiting AM labs.   Objective:  Vital signs:  Filed Vitals:   10/22/15 0501 10/22/15 0600 10/22/15 0603 10/22/15 0608  BP: 147/96 160/128 152/102 159/93  Pulse: 95 101 100 96  Temp: 98.1 F (36.7 C) 97.9 F (36.6 C) 98.1 F (36.7 C)   TempSrc:      Resp: 21 23 33 21  Height:      Weight:      SpO2: 100% 97% 96% 97%    Last BM Date: 10/16/15  Intake/Output   Yesterday:  02/08 0701 - 02/09 0700 In: 2072.5 [I.V.:400; NG/GT:300; IV Piggyback:1372.5] Out: 1941 [Urine:1570] This shift:     Physical Exam: General: Pt awake/alert/oriented and in no acute distress Chest: coarse on the right.  CV:  Pulses intact.  Regular rhythm Abdomen: Soft.  Nondistended.   Mildly tender at incisions only.  Incisions are c/d/i.  No evidence of peritonitis.  No incarcerated hernias. Ext:  SCDs BLE.  No mjr edema.  No cyanosis Skin: No petechiae / purpura   Problem List:   Principal Problem:   Acute cholecystitis Active Problems:   GERD   Hypertension   Type 2 diabetes mellitus ([place])   Cholelithiasis and acute cholecystitis without obstruction   Acute hypoxemic respiratory failure (HCC)   Abdominal pain   Dyspnea   Chest pain   Surgery, elective    Results:   Labs: Results for orders placed or performed during the hospital encounter of 10/17/15 (from the past 91 hour(s))  Glucose, capillary     Status: Abnormal   Collection Time: 10/20/15  9:02 AM  Result Value Ref Range   Glucose-Capillary 152 (H) 65 - 99 mg/dL  Glucose, capillary     Status: Abnormal   Collection Time: 10/20/15  11:29 AM  Result Value Ref Range   Glucose-Capillary 155 (H) 65 - 99 mg/dL  Glucose, capillary     Status: Abnormal   Collection Time: 10/20/15  3:54 PM  Result Value Ref Range   Glucose-Capillary 129 (H) 65 - 99 mg/dL  Glucose, capillary     Status: Abnormal   Collection Time: 10/20/15  8:18 PM  Result Value Ref Range   Glucose-Capillary 102 (H) 65 - 99 mg/dL  Glucose, capillary     Status: Abnormal   Collection Time: 10/21/15 12:03 AM  Result Value Ref Range   Glucose-Capillary 147 (H) 65 - 99 mg/dL   Comment 1 Notify RN   Procalcitonin     Status: None   Collection Time: 10/21/15  2:50 AM  Result Value Ref Range   Procalcitonin 14.62 ng/mL    Comment:        Interpretation: PCT >= 10 ng/mL: Important systemic inflammatory response, almost exclusively due to severe bacterial sepsis or septic shock. (NOTE)         ICU PCT Algorithm               Non ICU PCT Algorithm    ----------------------------     ------------------------------         PCT <  0.25 ng/mL                 PCT < 0.1 ng/mL     Stopping of antibiotics            Stopping of antibiotics       strongly encouraged.               strongly encouraged.    ----------------------------     ------------------------------       PCT level decrease by               PCT < 0.25 ng/mL       >= 80% from peak PCT       OR PCT 0.25 - 0.5 ng/mL          Stopping of antibiotics                                             encouraged.     Stopping of antibiotics           encouraged.    ----------------------------     ------------------------------       PCT level decrease by              PCT >= 0.25 ng/mL       < 80% from peak PCT        AND PCT >= 0.5 ng/mL             Continuing antibiotics                                              encouraged.       Continuing antibiotics            encouraged.    ----------------------------     ------------------------------     PCT level increase compared          PCT > 0.5 ng/mL          with peak PCT AND          PCT >= 0.5 ng/mL             Escalation of antibiotics                                          strongly encouraged.      Escalation of antibiotics        strongly encouraged.   Magnesium     Status: None   Collection Time: 10/21/15  2:50 AM  Result Value Ref Range   Magnesium 1.9 1.7 - 2.4 mg/dL  Phosphorus     Status: Abnormal   Collection Time: 10/21/15  2:50 AM  Result Value Ref Range   Phosphorus 1.5 (L) 2.5 - 4.6 mg/dL  CBC     Status: Abnormal   Collection Time: 10/21/15  2:50 AM  Result Value Ref Range   WBC 11.5 (H) 4.0 - 10.5 K/uL   RBC 3.30 (L) 4.22 - 5.81 MIL/uL   Hemoglobin 9.1 (L) 13.0 - 17.0 g/dL    Comment: REPEATED TO VERIFY   HCT 28.0 (L) 39.0 - 52.0 %   MCV 84.8  78.0 - 100.0 fL   MCH 27.6 26.0 - 34.0 pg   MCHC 32.5 30.0 - 36.0 g/dL   RDW 14.6 11.5 - 15.5 %   Platelets 172 150 - 400 K/uL  Comprehensive metabolic panel     Status: Abnormal   Collection Time: 10/21/15  2:50 AM  Result Value Ref Range   Sodium 141 135 - 145 mmol/L   Potassium 3.3 (L) 3.5 - 5.1 mmol/L   Chloride 110 101 - 111 mmol/L   CO2 23 22 - 32 mmol/L   Glucose, Bld 117 (H) 65 - 99 mg/dL   BUN 15 6 - 20 mg/dL   Creatinine, Ser 1.19 0.61 - 1.24 mg/dL   Calcium 7.2 (L) 8.9 - 10.3 mg/dL   Total Protein 5.0 (L) 6.5 - 8.1 g/dL   Albumin 1.9 (L) 3.5 - 5.0 g/dL   AST 32 15 - 41 U/L   ALT 27 17 - 63 U/L   Alkaline Phosphatase 64 38 - 126 U/L   Total Bilirubin 2.6 (H) 0.3 - 1.2 mg/dL   GFR calc non Af Amer >60 >60 mL/min   GFR calc Af Amer >60 >60 mL/min    Comment: (NOTE) The eGFR has been calculated using the CKD EPI equation. This calculation has not been validated in all clinical situations. eGFR's persistently <60 mL/min signify possible Chronic Kidney Disease.    Anion gap 8 5 - 15  Glucose, capillary     Status: Abnormal   Collection Time: 10/21/15  3:36 AM  Result Value Ref Range   Glucose-Capillary 119 (H) 65 - 99 mg/dL   Comment 1 Notify RN    Glucose, capillary     Status: Abnormal   Collection Time: 10/21/15  8:20 AM  Result Value Ref Range   Glucose-Capillary 159 (H) 65 - 99 mg/dL  CBC     Status: Abnormal   Collection Time: 10/21/15 11:16 AM  Result Value Ref Range   WBC 12.9 (H) 4.0 - 10.5 K/uL   RBC 3.50 (L) 4.22 - 5.81 MIL/uL   Hemoglobin 9.6 (L) 13.0 - 17.0 g/dL   HCT 30.1 (L) 39.0 - 52.0 %   MCV 86.0 78.0 - 100.0 fL   MCH 27.4 26.0 - 34.0 pg   MCHC 31.9 30.0 - 36.0 g/dL   RDW 15.0 11.5 - 15.5 %   Platelets 214 150 - 400 K/uL  Glucose, capillary     Status: Abnormal   Collection Time: 10/21/15 11:18 AM  Result Value Ref Range   Glucose-Capillary 170 (H) 65 - 99 mg/dL   Comment 1 Notify RN   I-STAT 3, arterial blood gas (G3+)     Status: Abnormal   Collection Time: 10/21/15  3:21 PM  Result Value Ref Range   pH, Arterial 7.397 7.350 - 7.450   pCO2 arterial 38.6 35.0 - 45.0 mmHg   pO2, Arterial 77.0 (L) 80.0 - 100.0 mmHg   Bicarbonate 23.3 20.0 - 24.0 mEq/L   TCO2 24 0 - 100 mmol/L   O2 Saturation 94.0 %   Acid-base deficit 1.0 0.0 - 2.0 mmol/L   Patient temperature 39.2 C    Collection site RADIAL, ALLEN'S TEST ACCEPTABLE    Drawn by MD    Sample type ARTERIAL   Glucose, capillary     Status: Abnormal   Collection Time: 10/21/15  3:21 PM  Result Value Ref Range   Glucose-Capillary 102 (H) 65 - 99 mg/dL  Glucose, capillary     Status: Abnormal  Collection Time: 10/21/15  8:40 PM  Result Value Ref Range   Glucose-Capillary 136 (H) 65 - 99 mg/dL  Glucose, capillary     Status: Abnormal   Collection Time: 10/21/15 11:47 PM  Result Value Ref Range   Glucose-Capillary 151 (H) 65 - 99 mg/dL  Glucose, capillary     Status: Abnormal   Collection Time: 10/22/15  3:41 AM  Result Value Ref Range   Glucose-Capillary 124 (H) 65 - 99 mg/dL    Imaging / Studies: Dg Chest Port 1 View  10/21/2015  CLINICAL DATA:  Ventilator dependent EXAM: PORTABLE CHEST 1 VIEW COMPARISON:  10/20/2015 FINDINGS: Endotracheal  tube just above the carina. This could be withdrawn 3 cm. NG tube in the stomach. Decreased lung volume compared with the prior study with increase in bibasilar atelectasis. Small left effusion. IMPRESSION: Endotracheal tube at the carina, recommend withdrawal 3 cm Progression of bibasilar atelectasis with decreased lung volume since yesterday. Electronically Signed   By: Franchot Gallo M.D.   On: 10/21/2015 07:55    Medications / Allergies:  Scheduled Meds: . acetaminophen  1,000 mg Intravenous 4 times per day  . antiseptic oral rinse  7 mL Mouth Rinse QID  . chlorhexidine gluconate  15 mL Mouth Rinse BID  . feeding supplement (PRO-STAT SUGAR FREE 64)  30 mL Per Tube BID  . fluconazole (DIFLUCAN) IV  200 mg Intravenous Q24H  . heparin subcutaneous  5,000 Units Subcutaneous 3 times per day  . Influenza vac split quadrivalent PF  0.5 mL Intramuscular Tomorrow-1000  . insulin aspart  0-15 Units Subcutaneous 6 times per day  . ipratropium-albuterol  3 mL Nebulization TID  . pantoprazole (PROTONIX) IV  40 mg Intravenous Q24H  . piperacillin-tazobactam (ZOSYN)  IV  3.375 g Intravenous 3 times per day  . pneumococcal 23 valent vaccine  0.5 mL Intramuscular Tomorrow-1000  . vancomycin  750 mg Intravenous BID   Continuous Infusions: . sodium chloride 10 mL/hr at 10/21/15 1000   PRN Meds:.acetaminophen, albuterol, ondansetron **OR** ondansetron (ZOFRAN) IV  Antibiotics: Anti-infectives    Start     Dose/Rate Route Frequency Ordered Stop   10/20/15 2300  fluconazole (DIFLUCAN) IVPB 200 mg     200 mg 100 mL/hr over 60 Minutes Intravenous Every 24 hours 10/20/15 0012     10/20/15 0015  vancomycin (VANCOCIN) IVPB 750 mg/150 ml premix     750 mg 150 mL/hr over 60 Minutes Intravenous 2 times daily 10/20/15 0012     10/20/15 0015  fluconazole (DIFLUCAN) IVPB 400 mg     400 mg 100 mL/hr over 120 Minutes Intravenous  Once 10/20/15 0012 10/20/15 0521   10/19/15 1400  piperacillin-tazobactam (ZOSYN)  IVPB 3.375 g     3.375 g 12.5 mL/hr over 240 Minutes Intravenous 3 times per day 10/19/15 1230     10/17/15 1400  piperacillin-tazobactam (ZOSYN) IVPB 3.375 g  Status:  Discontinued     3.375 g 12.5 mL/hr over 240 Minutes Intravenous 3 times per day 10/17/15 1334 10/19/15 1116   10/17/15 0600  piperacillin-tazobactam (ZOSYN) IVPB 3.375 g     3.375 g 100 mL/hr over 30 Minutes Intravenous  Once 10/17/15 0553 10/17/15 0743       Assessment/Plan: Acute cholecystitis  POD #4 s/p lap chole with NEG IOC---Dr. Kieth Brightly ABLA-stable, await AM labs.  Acute hypoxic respiratory failure-extubated 2/8. pulm toilets, nebs.  ID-Zosyn D#5, Vanc D#.  Fluconazole D#2 for presumed aspiration PNA.  Resp Cx candida VTE prophylaxis-SCD/heparin FEN-allow for clears  Dispo-ok for SDU from a surgical standpoint  Erby Pian, [facility] Surgery Pager [phone]) For consults and floor pages call [phone](7A-4:30P)  10/22/2015 7:50 AM

## 2015-10-22 NOTE — Progress Notes (Signed)
PULMONARY / CRITICAL CARE MEDICINE   Name: Francisco [name] MRN: [id number] DOB: 1946/01/22    ADMISSION DATE:  10/17/2015 CONSULTATION DATE:  10/19/15  REFERRING MD:  Josem Kaufmann  CHIEF COMPLAINT:  Hypoxic respiratory failure  HISTORY OF PRESENT ILLNESS:  Francisco [name] is a 70 y.o. male with PMH of HTN, chronic back pain, GERD, DM. He is s/p lap chole with neg IOC which was completed without complication (done 10/18/15).  On 02/06, he had fever to 102.2.  Later that night, he had change in mental status with increased somnolence, decreased responsiveness, mod respiratory distress, hypoxic resp failure.   SUBJECTIVE:  Extubated yesterday with some wheezing post-extubation.  N/V of tube feeds. NGT removed.   VITAL SIGNS: BP 159/93 mmHg  Pulse 96  Temp(Src) 98.1 F (36.7 C) (Core (Comment))  Resp 21  Ht  (1.6 m)  Wt 174 lb 9.7 oz (79.2 kg)  BMI 30.94 kg/m2  SpO2 97%  HEMODYNAMICS:    VENTILATOR SETTINGS: Vent Mode:  [-] PSV;CPAP FiO2 (%):  [40 %] 40 % PEEP:  [5 cmH20] 5 cmH20 Pressure Support:  [5 cmH20] 5 cmH20  INTAKE / OUTPUT: I/O last 3 completed shifts: In: 4146.5 [I.V.:1761.5; NG/GT:675; IV Piggyback:1710] Out: 1975 [Urine:1850; Emesis/NG output:125]   PHYSICAL EXAMINATION:  General: Awake, resting comfortbaly Neuro: Awake and alert HEENT: NCAT, MMM,  Cardiovascular: RRR, no M/R/G.  Lungs: Some wheezing. Tight. Normal work of breathing. Abdomen: +BS, NT/ND.  Incision dressings C/D/I. Musculoskeletal: No gross deformities, no edema.  Skin: Intact, warm, no rashes.  LABS:  BMET  Recent Labs Lab 10/20/15 0050 10/20/15 0222 10/21/15 0250  NA 138 139 141  K 3.9 3.4* 3.3*  CL 104 103 110  CO2 BUN 21* 21* 15  CREATININE 1.36* 1.46* 1.19  GLUCOSE 178* 170* 117*    Electrolytes  Recent Labs Lab 10/20/15 0050 10/20/15 0222 10/21/15 0250  CALCIUM 7.9* 8.1* 7.2*  MG 1.9  --  1.9  PHOS 1.2*  --  1.5*    CBC  Recent Labs Lab 10/20/15 0222  10/21/15 0250 10/21/15 1116  WBC 12.0* 11.5* 12.9*  HGB 12.3* 9.1* 9.6*  HCT 38.7* 28.0* 30.1*  PLT 225 172 214    Coag's  Recent Labs Lab 10/20/15 0222  APTT 33  INR 1.33    Sepsis Markers  Recent Labs Lab 10/17/15 0344 10/20/15 0050 10/20/15 0222 10/20/15 0430 10/21/15 0250  LATICACIDVEN 1.59 1.6  --  1.9  --   PROCALCITON  --  12.23 15.02  --  14.62    ABG  Recent Labs Lab 10/19/15 2245 10/20/15 0136 10/21/15 1521  PHART 7.326* 7.261* 7.397  PCO2ART 45.7* 50.7* 38.6  PO2ART 65.0* 135.0* 77.0*    Liver Enzymes  Recent Labs Lab 10/19/15 1718 10/20/15 0222 10/21/15 0250  AST 58* 56* 32  ALT 44 42 27  ALKPHOS 72 79 64  BILITOT 2.2* 3.7* 2.6*  ALBUMIN 2.7* 2.7* 1.9*    Cardiac Enzymes  Recent Labs Lab 10/19/15 1649 10/20/15 0050  TROPONINI 0.04* 0.06*    Glucose  Recent Labs Lab 10/21/15 0820 10/21/15 1118 10/21/15 1521 10/21/15 2040 10/21/15 2347 10/22/15 0341  GLUCAP 159* 170* 102* 136* 151* 124*    Imaging No results found.   STUDIES:  CT A / P 02/04 > acute cholecystitis. CTA chest 02/06 > no PE.  Bibasilar opacities L > R raising concern for aspiration.  Trace right effusion.  Collection of fluid and air at gallbladder fossa raising  question of bile leak.  CXR 02/06 > bibasilar atx, low volumes.  CULTURES: Blood 02/04 > NG x3 days Repeat Blood 2/7> Sputum 02/06 >  ANTIBIOTICS: Zosyn 02/04 >  Vanc 02/06 > Diflucan 02/06 >  SIGNIFICANT EVENTS: 02/04 > admitted with abd pain due to cholecystitis. 02/05 > lap chole. 02/06 > fever spike > resp distress > hypoxia > transfer to ICU.  LINES/TUBES: ETT 02/06 > NGT 2/6> PIVs  ASSESSMENT / PLAN:  PULMONARY A: Acute hypoxic respiratory failure - likely multifactorial due to aspiration PNA, atx, splinting from pain, ? Narcotics. - Improving Aspiration PNA. P:   Extubated on [place] Continue empiric abx DuoNebs / Albuterol.   CARDIOVASCULAR A:  Hx HTN. -  Stable P:  Monitor hemodynamics. Assess lactate, troponins.  RENAL A:   AKI. - Imrpoved Pseudohypocalcemia - corrects to 9.14. P:   BMP daily Allow pos balance Waiting for labs this AM  GASTROINTESTINAL A:   Acute cholecystitis - s/p lap chole 02/05. Collection of fluid and air at gallbladder fossa raising question of bile leak - suspect anticipated / normal post op findings. GERD. Nutrition. Hx SBO. P:   Post op care per CCS. SUP: Pantoprazole.  SLP study then clear liquids  HEMATOLOGIC A:   Anemia - No source of bleeding evident. Recent surgery. VTE Prophylaxis. P:  Transfuse for Hgb < 7. SCD's / Heparin. CBC daily Monitor for bleeding   INFECTIOUS A:   Aspiration PNA. At risk for bile leak - vs anticipated post operative changes. P:   Abx - discontinue diflucan; switch to Unasyn and d/c vanc/zoysn Follow cultures - NGTD PCT algorithm to limit abx exposure. - slightly improved 15.02>14.62 (questionable reliability with recent surgery)  ENDOCRINE A:   DM.  P:   Mod SSI. Hold outpatient metformin.  NEUROLOGIC A:   Acute encephalopathy - possibly infectious vs narcotics. - Improved Hx back pain, DJD, scoliosis. P:   Awake and alert; no need for sedation RASS: 0  Transfer to Med-surg.   Family updated: Family(son) updated at bedside.  Interdisciplinary Family Meeting v Palliative Care Meeting:  Due by: 02/12.  Caryl Ada, DO 10/22/2015, 7:55 AM PGY-2, [facility]

## 2015-10-22 NOTE — Care Management Important Message (Signed)
Important Message  Patient Details  Name: Francisco [name] MRN: [id number] Date of Birth: 1946-03-05   Medicare Important Message Given:  Yes    Bernadette Hoit 10/22/2015, 8:05 AM

## 2015-10-22 NOTE — Progress Notes (Signed)
Pharmacy Antibiotic Note  Francisco [name] is a 70 y.o. male admitted on 10/17/2015 with aspiration pneumonia.  Pharmacy has been consulted for Unasyn dosing.  Plan: D/c vancomycin, zosyn, and fluconazole  Start Unasyn 3 gm IV q6h F/u LOT (has already received 8 days of coverage) Monitor cx, cbc, renal function, clinical course  Height:  (160 cm) Weight: 174 lb 9.7 oz (79.2 kg) IBW/kg (Calculated) : 56.9  Temp (24hrs), Avg:99.3 F (37.4 C), Min:97.9 F (36.6 C), Max:102.6 F (39.2 C)   Recent Labs Lab 10/17/15 0344  10/19/15 1718 10/20/15 0050 10/20/15 0222 10/20/15 0430 10/21/15 0250 10/21/15 1116 10/22/15 0931  WBC  --   < > 12.3* 9.0 12.0*  --  11.5* 12.9* 16.1*  CREATININE  --   < > 1.39* 1.36* 1.46*  --  1.19  --  1.05  LATICACIDVEN 1.59  --   --  1.6  --  1.9  --   --   --   < > = values in this interval not displayed.  Estimated Creatinine Clearance: 60.9 mL/min (by C-G formula based on Cr of 1.05).    No Known Allergies  Antimicrobials this admission: Zosyn 2/4 >> 2/9 Vancomycin 2/4 >> 2/9 Fluconazole 2/7 >> 2/9 Unasyn 2/9 >>  Dose adjustments this admission: None  Microbiology results: 2/4 BCx: NGTD 2/7 Sputum: few yeast  2/4 MRSA PCR: negative, SA positive  Thank you for allowing pharmacy to be a part of this patient's care.  Cassie L. Roseanne Reno, PharmD PGY2 Infectious Diseases Pharmacy Resident Pager: [phone] 10/22/2015 11:31 AM

## 2015-10-22 NOTE — Progress Notes (Signed)
eLink Physician-Brief Progress Note Patient Name: Francisco [name] DOB: [birth year] MRN: [id number]   Date of Service  10/22/2015  HPI/Events of Note  Patient's abdomen firm and distended. No BS. Patient worse s/p eating clear liquids.  eICU Interventions  Will order: 1. NPO except sips and chips. 2. NGT to LIS.     Intervention Category Intermediate Interventions: Abdominal pain - evaluation and management  Vernice Mannina Eugene 10/22/2015, 8:42 PM

## 2015-10-23 ENCOUNTER — Inpatient Hospital Stay (HOSPITAL_COMMUNITY): Payer: Medicare Other

## 2015-10-23 DIAGNOSIS — Z9049 Acquired absence of other specified parts of digestive tract: Secondary | ICD-10-CM | POA: Insufficient documentation

## 2015-10-23 DIAGNOSIS — J9811 Atelectasis: Secondary | ICD-10-CM | POA: Insufficient documentation

## 2015-10-23 DIAGNOSIS — Z7984 Long term (current) use of oral hypoglycemic drugs: Secondary | ICD-10-CM

## 2015-10-23 DIAGNOSIS — E1165 Type 2 diabetes mellitus with hyperglycemia: Secondary | ICD-10-CM

## 2015-10-23 LAB — CBC
HEMATOCRIT: 31.7 % — AB (ref 39.0–52.0)
HEMOGLOBIN: 10.6 g/dL — AB (ref 13.0–17.0)
MCH: 28.4 pg (ref 26.0–34.0)
MCHC: 33.4 g/dL (ref 30.0–36.0)
MCV: 85 fL (ref 78.0–100.0)
PLATELETS: 320 10*3/uL (ref 150–400)
RBC: 3.73 MIL/uL — AB (ref 4.22–5.81)
RDW: 14.9 % (ref 11.5–15.5)
WBC: 12.7 10*3/uL — AB (ref 4.0–10.5)

## 2015-10-23 LAB — HEPATIC FUNCTION PANEL
ALBUMIN: 2 g/dL — AB (ref 3.5–5.0)
ALT: 43 U/L (ref 17–63)
AST: 81 U/L — AB (ref 15–41)
Alkaline Phosphatase: 253 U/L — ABNORMAL HIGH (ref 38–126)
BILIRUBIN DIRECT: 0.7 mg/dL — AB (ref 0.1–0.5)
Indirect Bilirubin: 1 mg/dL — ABNORMAL HIGH (ref 0.3–0.9)
TOTAL PROTEIN: 6.1 g/dL — AB (ref 6.5–8.1)
Total Bilirubin: 1.7 mg/dL — ABNORMAL HIGH (ref 0.3–1.2)

## 2015-10-23 LAB — PROCALCITONIN: Procalcitonin: 4.32 ng/mL

## 2015-10-23 LAB — BASIC METABOLIC PANEL
ANION GAP: 11 (ref 5–15)
BUN: 12 mg/dL (ref 6–20)
CHLORIDE: 108 mmol/L (ref 101–111)
CO2: 24 mmol/L (ref 22–32)
Calcium: 8.3 mg/dL — ABNORMAL LOW (ref 8.9–10.3)
Creatinine, Ser: 0.97 mg/dL (ref 0.61–1.24)
GFR calc Af Amer: >60 mL/min (ref >60)
GLUCOSE: 158 mg/dL — AB (ref 65–99)
POTASSIUM: 3.4 mmol/L — AB (ref 3.5–5.1)
Sodium: 143 mmol/L (ref 135–145)

## 2015-10-23 LAB — GLUCOSE, CAPILLARY
Glucose-Capillary: 134 mg/dL — ABNORMAL HIGH (ref 65–99)
Glucose-Capillary: 138 mg/dL — ABNORMAL HIGH (ref 65–99)
Glucose-Capillary: 124 mg/dL — ABNORMAL HIGH (ref 65–99)
Glucose-Capillary: 113 mg/dL — ABNORMAL HIGH (ref 65–99)

## 2015-10-23 MED ORDER — IOHEXOL 300 MG/ML  SOLN: 25.0000 mL | INTRAMUSCULAR | Status: AC

## 2015-10-23 MED ORDER — ACETAMINOPHEN 10 MG/ML IV SOLN
1000.0000 mg | Freq: Four times a day (QID) | INTRAVENOUS | Status: AC
  Administered 2015-10-23 (×4): 1000 mg via INTRAVENOUS
  Filled 2015-10-23 (×5): qty 100

## 2015-10-23 MED ORDER — INSULIN ASPART 100 UNIT/ML ~~LOC~~ SOLN
0.0000 [IU] | Freq: Three times a day (TID) | SUBCUTANEOUS | Status: DC
  Administered 2015-10-23 (×2): 1 [IU] via SUBCUTANEOUS

## 2015-10-23 MED ORDER — IOHEXOL 300 MG/ML  SOLN
25.0000 mL | Freq: Once | INTRAMUSCULAR | Status: AC | PRN
  Administered 2015-10-23: 25 mL via ORAL

## 2015-10-23 MED ORDER — INSULIN ASPART 100 UNIT/ML ~~LOC~~ SOLN
0.0000 [IU] | SUBCUTANEOUS | Status: DC
  Administered 2015-10-24 (×2): 1 [IU] via SUBCUTANEOUS
  Administered 2015-10-25: 2 [IU] via SUBCUTANEOUS
  Administered 2015-10-25: 1 [IU] via SUBCUTANEOUS
  Administered 2015-10-25: 3 [IU] via SUBCUTANEOUS
  Administered 2015-10-25: 2 [IU] via SUBCUTANEOUS

## 2015-10-23 MED ORDER — AMLODIPINE BESYLATE 2.5 MG PO TABS
2.5000 mg | ORAL_TABLET | Freq: Every day | ORAL | Status: DC
  Administered 2015-10-23: 2.5 mg via ORAL
  Filled 2015-10-23: qty 1

## 2015-10-23 MED ORDER — SODIUM CHLORIDE 0.9 % IV SOLN
INTRAVENOUS | Status: AC
  Administered 2015-10-23: 12:00:00 via INTRAVENOUS

## 2015-10-23 MED ORDER — IPRATROPIUM-ALBUTEROL 0.5-2.5 (3) MG/3ML IN SOLN
3.0000 mL | Freq: Four times a day (QID) | RESPIRATORY_TRACT | Status: DC
  Filled 2015-10-23: qty 3

## 2015-10-23 MED ORDER — LIDOCAINE VISCOUS 2 % MT SOLN
OROMUCOSAL | Status: AC
  Filled 2015-10-23: qty 15

## 2015-10-23 MED ORDER — LISINOPRIL 10 MG PO TABS
10.0000 mg | ORAL_TABLET | Freq: Every day | ORAL | Status: DC
  Administered 2015-10-24 – 2015-10-26 (×2): 10 mg via ORAL
  Filled 2015-10-23 (×3): qty 1

## 2015-10-23 MED ORDER — HYDRALAZINE HCL 20 MG/ML IJ SOLN: 5.0000 mg | Freq: Four times a day (QID) | INTRAMUSCULAR | Status: DC | PRN

## 2015-10-23 MED ORDER — POTASSIUM CHLORIDE 10 MEQ/100ML IV SOLN
10.0000 meq | INTRAVENOUS | Status: AC
Start: 2015-10-23 — End: 2015-10-23
  Administered 2015-10-23 (×2): 10 meq via INTRAVENOUS
  Filled 2015-10-23 (×2): qty 100

## 2015-10-23 NOTE — Progress Notes (Signed)
Pt to transfer back to [facility] this morning who will resume care.   Otis Brace MD  PGY-3 [facility] Pager [phone]

## 2015-10-23 NOTE — Progress Notes (Signed)
MD paged again, awaiting response.

## 2015-10-23 NOTE — Progress Notes (Signed)
Patient ID: Francisco [name], male   DOB: 03-Jun-1946, 70 y.o.   MRN: [id number]     [address] [place], [place] [address]    Phone: [phone] FAX: [phone]     Subjective: Family at bedside.  Vomited several times last being around 3AM Night staff unsuccessful at NGT placement. No bowel function. Afebrile.   Objective:  Vital signs:  Filed Vitals:   10/22/15 1619 10/22/15 2125 10/22/15 2209 10/23/15 0601  BP:   165/94 171/97  Pulse: 96  100 95  Temp: 98.8 F (37.1 C)  99.3 F (37.4 C) 97.8 F (36.6 C)  TempSrc: Oral  Oral Oral  Resp:   22 16  Height:      Weight:    75.1 kg (165 lb 9.1 oz)  SpO2: 100% 100% 97% 92%    Last BM Date: 10/22/15  Intake/Output   Yesterday:  02/09 0701 - 02/10 0700 In: 437.5 [I.V.:37.5; IV Piggyback:400] Out: 7035 [Urine:1750] This shift:     Physical Exam: General: Pt awake/alert/oriented x4 in no acute distress  Chest: expiratory wheezes  CV:  Pulses intact.  Regular rhythm  Abdomen: abdomen is distended, no bs. appropriately tender, incisions are c/d/i.    Problem List:   Principal Problem:   Acute cholecystitis Active Problems:   GERD   Hypertension   Type 2 diabetes mellitus (HCC)   Cholelithiasis and acute cholecystitis without obstruction   Acute hypoxemic respiratory failure (HCC)   Abdominal pain   Dyspnea   Chest pain   Surgery, elective    Results:   Labs: Results for orders placed or performed during the hospital encounter of 10/17/15 (from the past 48 hour(s))  Glucose, capillary     Status: Abnormal   Collection Time: 10/21/15  8:20 AM  Result Value Ref Range   Glucose-Capillary 159 (H) 65 - 99 mg/dL  CBC     Status: Abnormal   Collection Time: 10/21/15 11:16 AM  Result Value Ref Range   WBC 12.9 (H) 4.0 - 10.5 K/uL   RBC 3.50 (L) 4.22 - 5.81 MIL/uL   Hemoglobin 9.6 (L) 13.0 - 17.0 g/dL   HCT 30.1 (L) 39.0 - 52.0 %   MCV 86.0  78.0 - 100.0 fL   MCH 27.4 26.0 - 34.0 pg   MCHC 31.9 30.0 - 36.0 g/dL   RDW 15.0 11.5 - 15.5 %   Platelets 214 150 - 400 K/uL  Glucose, capillary     Status: Abnormal   Collection Time: 10/21/15 11:18 AM  Result Value Ref Range   Glucose-Capillary 170 (H) 65 - 99 mg/dL   Comment 1 Notify RN   I-STAT 3, arterial blood gas (G3+)     Status: Abnormal   Collection Time: 10/21/15  3:21 PM  Result Value Ref Range   pH, Arterial 7.397 7.350 - 7.450   pCO2 arterial 38.6 35.0 - 45.0 mmHg   pO2, Arterial 77.0 (L) 80.0 - 100.0 mmHg   Bicarbonate 23.3 20.0 - 24.0 mEq/L   TCO2 24 0 - 100 mmol/L   O2 Saturation 94.0 %   Acid-base deficit 1.0 0.0 - 2.0 mmol/L   Patient temperature 39.2 C    Collection site RADIAL, ALLEN'S TEST ACCEPTABLE    Drawn by MD    Sample type ARTERIAL   Glucose, capillary     Status: Abnormal   Collection Time: 10/21/15  3:21 PM  Result Value Ref Range  Glucose-Capillary 102 (H) 65 - 99 mg/dL  Glucose, capillary     Status: Abnormal   Collection Time: 10/21/15  8:40 PM  Result Value Ref Range   Glucose-Capillary 136 (H) 65 - 99 mg/dL  Glucose, capillary     Status: Abnormal   Collection Time: 10/21/15 11:47 PM  Result Value Ref Range   Glucose-Capillary 151 (H) 65 - 99 mg/dL  Glucose, capillary     Status: Abnormal   Collection Time: 10/22/15  3:41 AM  Result Value Ref Range   Glucose-Capillary 124 (H) 65 - 99 mg/dL  Glucose, capillary     Status: Abnormal   Collection Time: 10/22/15  8:36 AM  Result Value Ref Range   Glucose-Capillary 103 (H) 65 - 99 mg/dL  Basic metabolic panel     Status: Abnormal   Collection Time: 10/22/15  9:31 AM  Result Value Ref Range   Sodium 144 135 - 145 mmol/L   Potassium 3.8 3.5 - 5.1 mmol/L   Chloride 111 101 - 111 mmol/L   CO2 22 22 - 32 mmol/L   Glucose, Bld 119 (H) 65 - 99 mg/dL   BUN 14 6 - 20 mg/dL   Creatinine, Ser 1.05 0.61 - 1.24 mg/dL   Calcium 8.2 (L) 8.9 - 10.3 mg/dL   GFR calc non Af Amer >60 >60 mL/min    GFR calc Af Amer >60 >60 mL/min    Comment: (NOTE) The eGFR has been calculated using the CKD EPI equation. This calculation has not been validated in all clinical situations. eGFR's persistently <60 mL/min signify possible Chronic Kidney Disease.    Anion gap 11 5 - 15  CBC     Status: Abnormal   Collection Time: 10/22/15  9:31 AM  Result Value Ref Range   WBC 16.1 (H) 4.0 - 10.5 K/uL   RBC 3.68 (L) 4.22 - 5.81 MIL/uL   Hemoglobin 10.5 (L) 13.0 - 17.0 g/dL   HCT 31.2 (L) 39.0 - 52.0 %   MCV 84.8 78.0 - 100.0 fL   MCH 28.5 26.0 - 34.0 pg   MCHC 33.7 30.0 - 36.0 g/dL   RDW 14.8 11.5 - 15.5 %   Platelets 266 150 - 400 K/uL  Magnesium     Status: None   Collection Time: 10/22/15  9:31 AM  Result Value Ref Range   Magnesium 2.2 1.7 - 2.4 mg/dL  Phosphorus     Status: Abnormal   Collection Time: 10/22/15  9:31 AM  Result Value Ref Range   Phosphorus 2.3 (L) 2.5 - 4.6 mg/dL  Procalcitonin     Status: None   Collection Time: 10/22/15  9:31 AM  Result Value Ref Range   Procalcitonin 6.91 ng/mL    Comment:        Interpretation: PCT > 2 ng/mL: Systemic infection (sepsis) is likely, unless other causes are known. (NOTE)         ICU PCT Algorithm               Non ICU PCT Algorithm    ----------------------------     ------------------------------         PCT < 0.25 ng/mL                 PCT < 0.1 ng/mL     Stopping of antibiotics            Stopping of antibiotics       strongly encouraged.  strongly encouraged.    ----------------------------     ------------------------------       PCT level decrease by               PCT < 0.25 ng/mL       >= 80% from peak PCT       OR PCT 0.25 - 0.5 ng/mL          Stopping of antibiotics                                             encouraged.     Stopping of antibiotics           encouraged.    ----------------------------     ------------------------------       PCT level decrease by              PCT >= 0.25 ng/mL       <  80% from peak PCT        AND PCT >= 0.5 ng/mL            Continuing antibiotics                                               encouraged.       Continuing antibiotics            encouraged.    ----------------------------     ------------------------------     PCT level increase compared          PCT > 0.5 ng/mL         with peak PCT AND          PCT >= 0.5 ng/mL             Escalation of antibiotics                                          strongly encouraged.      Escalation of antibiotics        strongly encouraged.   Glucose, capillary     Status: Abnormal   Collection Time: 10/22/15 11:57 AM  Result Value Ref Range   Glucose-Capillary 130 (H) 65 - 99 mg/dL  Glucose, capillary     Status: Abnormal   Collection Time: 10/22/15 10:15 PM  Result Value Ref Range   Glucose-Capillary 169 (H) 65 - 99 mg/dL    Imaging / Studies: No results found.  Medications / Allergies:  Scheduled Meds: . acetaminophen  1,000 mg Intravenous 4 times per day  . ampicillin-sulbactam (UNASYN) IV  3 g Intravenous Q6H  . antiseptic oral rinse  7 mL Mouth Rinse BID  . heparin subcutaneous  5,000 Units Subcutaneous 3 times per day  . Influenza vac split quadrivalent PF  0.5 mL Intramuscular Tomorrow-1000  . insulin aspart  0-15 Units Subcutaneous TID AC & HS  . ipratropium-albuterol  3 mL Nebulization TID  . pantoprazole (PROTONIX) IV  40 mg Intravenous Q24H  . pneumococcal 23 valent vaccine  0.5 mL Intramuscular Tomorrow-1000   Continuous Infusions: . sodium chloride 10 mL/hr at 10/21/15 1000   PRN Meds:.acetaminophen, albuterol, ondansetron **OR** ondansetron (  ZOFRAN) IV  Antibiotics: Anti-infectives    Start     Dose/Rate Route Frequency Ordered Stop   10/22/15 0930  Ampicillin-Sulbactam (UNASYN) 3 g in sodium chloride 0.9 % 100 mL IVPB     3 g 100 mL/hr over 60 Minutes Intravenous Every 6 hours 10/22/15 0920     10/20/15 2300  fluconazole (DIFLUCAN) IVPB 200 mg  Status:  Discontinued      200 mg 100 mL/hr over 60 Minutes Intravenous Every 24 hours 10/20/15 0012 10/22/15 0918   10/20/15 0015  vancomycin (VANCOCIN) IVPB 750 mg/150 ml premix  Status:  Discontinued     750 mg 150 mL/hr over 60 Minutes Intravenous 2 times daily 10/20/15 0012 10/22/15 0920   10/20/15 0015  fluconazole (DIFLUCAN) IVPB 400 mg     400 mg 100 mL/hr over 120 Minutes Intravenous  Once 10/20/15 0012 10/20/15 0521   10/19/15 1400  piperacillin-tazobactam (ZOSYN) IVPB 3.375 g  Status:  Discontinued     3.375 g 12.5 mL/hr over 240 Minutes Intravenous 3 times per day 10/19/15 1230 10/22/15 0920   10/17/15 1400  piperacillin-tazobactam (ZOSYN) IVPB 3.375 g  Status:  Discontinued     3.375 g 12.5 mL/hr over 240 Minutes Intravenous 3 times per day 10/17/15 1334 10/19/15 1116   10/17/15 0600  piperacillin-tazobactam (ZOSYN) IVPB 3.375 g     3.375 g 100 mL/hr over 30 Minutes Intravenous  Once 10/17/15 0553 10/17/15 0743       Assessment/Plan: Acute cholecystitis  POD # s/p lap chole with NEG IOC---Dr. Kieth Brightly Post op ileus -NPO, NGT to LIWS.  ?check LFTs, HIDA for leak ABLA-stable Acute hypoxic respiratory failure-extubated 2/8. pulm toilets, nebs. still wheezing  ID-Vanz/zosyn/fluconazole changed Unasyn for PNA VTE prophylaxis-SCD/heparin FEN-NPO, IVF per primary team Dispo-ileus   Erby Pian, [facility] Pager [phone](7A-4:30P) For consults and floor pages call [phone](7A-4:30P)  10/23/2015 8:11 AM

## 2015-10-23 NOTE — Progress Notes (Signed)
MD paged, awaiting response.

## 2015-10-23 NOTE — Progress Notes (Signed)
Internal Medicine Attending  Date: 10/23/2015  Patient name: Francisco [name] Medical record number: [id number] Date of birth: 27-Sep-1945 Age: 70 y.o. Gender: male  I saw and evaluated the patient. I reviewed the resident's note by Dr. Andrey Campanile and I agree with the resident's findings and plans as documented in her rpogress note.  Francisco [name] was seen on rounds this morning. He had several family members in the room with him and they preferred to speak Hindi. With translation there seemed to be some confusion the patient had as he was talking about weddings and walking in front of women. At other times he seemed more clear with a chief complaint being his cough and denying any other pain. We discussed the importance of getting up out of the bed today and into a chair for most of the day with both the patient and the family. In addition, we demonstrated the appropriate way to use an incentive spirometer. Although he had trouble maintaining a full inspiratory hold he was markedly improved with the use of his incentive spirometer after demonstration and encouragement by his family. His abdomen was more distended than upon transfer to the ICU, but remains soft with hypoactive bowel sounds. Lung exam revealed bibasilar inspiratory crackles and squeaks. I suspect his abdominal function will return with time and appreciate surgery's input and recommendations for further evaluation. We will drop an NG tube to decompress. From a pulmonary standpoint it will be very important that he get up to a chair and uses his incentive spirometer. We have recruited his extensive and supportive family in ensuring this occurs. We will reassess the effects of these interventions on his general health status and recovery in the morning.

## 2015-10-23 NOTE — Progress Notes (Signed)
2 RNs attempted unsuccessfully on two separate occasions to insert an NG tube. Pt is not able to tolerate procedure due to hypersensitive  gaging. Will notify MD

## 2015-10-23 NOTE — Progress Notes (Addendum)
Interval History: Francisco [name] is a 70 year old male with recently diagnosed DM and HTN who presented with acute epigastric pain on 10/18/15.  Work-up in the ED revealed acute cholecystitis with dilated CBD.  He was take for laparoscopic cholecystectomy on the day of admission, 02/05.  The following afternoon he experienced acute hypoxic respiratory failure likely 2/2 to aspiration PNA and was transferred to the ICU and intubated.  Subjective: Overnight Francisco [name]'s abdomen was noted to be firm with absent BS and attempts were made at NG placement x 2 and he reportedly pulled it out.  He  was seen and examined this AM.  His sons were present and one of them translated.  His son was present when he vomited and reports hematemesis (enough to fill an 8oz cup) around 3AM.  Per chart review, the NG attempts were made around 1:30AM.  The patient is currently resting in bed. He reports an episode of nausea but no vomiting about 20 minutes ago.  He has epigastric pain and points to site of epigastric trochar incision.  It is difficult to get him to clarify whether it is worsening or improving since surgery.  He reports last BM was yesterday and it was normal, non-bloody.  The RN confirms his last BM was yesterday.  He also complains of nose, throat and tongue irritation.  He denies shortness of breath or pain other than in his stomach.  Objective: Vital signs in last 24 hours: Filed Vitals:   10/22/15 1619 10/22/15 2125 10/22/15 2209 10/23/15 0601  BP:   165/94 171/97  Pulse: 96  100 95  Temp: 98.8 F (37.1 C)  99.3 F (37.4 C) 97.8 F (36.6 C)  TempSrc: Oral  Oral Oral  Resp:   22 16  Height:      Weight:    165 lb 9.1 oz (75.1 kg)  SpO2: 100% 100% 97% 92%   Weight change: -9 lb 0.6 oz (-4.1 kg)  Intake/Output Summary (Last 24 hours) at 10/23/15 0720 Last data filed at 10/23/15 0500  Gross per 24 hour  Intake  437.5 ml  Output   1750 ml  Net -1312.5 ml   General: resting in bed in  NAD HEENT: [place]/AT Cardiac: RRR, no rubs, murmurs or gallops Pulm: + wheezes, moving normal volumes of air Abd: mild distention, no BS, non-tender, incisions c/d/i GU:  350 cc clear, light yellow urine in Foley bag Ext: warm and well perfused, no pedal edema, 2+ DPs and radials Neuro: son says he is responding appropriate, however he says he does not know where he is when questioned about place and says he does not know his name, moves all extremities spontaneously, able to roll for lung exam.  Lab Results: Basic Metabolic Panel:  Recent Labs Lab 10/21/15 0250 10/22/15 0931 10/23/15 1000  NA 141 144 143  K 3.3* 3.8 3.4*  CL 110 111 108  CO2 GLUCOSE 117* 119* 158*  BUN CREATININE 1.19 1.05 0.97  CALCIUM 7.2* 8.2* 8.3*  MG 1.9 2.2  --   PHOS 1.5* 2.3*  --    Liver Function Tests:  Recent Labs Lab 10/21/15 0250 10/23/15 0740  AST 32 81*  ALT 27 43  ALKPHOS 64 253*  BILITOT 2.6* 1.7*  PROT 5.0* 6.1*  ALBUMIN 1.9* 2.0*   CBC:  Recent Labs Lab 10/17/15 0335  10/20/15 0222  10/21/15 1116 10/22/15 0931  WBC 12.1*  < > 12.0*  < >  12.9* 16.1*  NEUTROABS 8.6*  --  9.5*  --   --   --   HGB 15.7  < > 12.3*  < > 9.6* 10.5*  HCT 45.3  < > 38.7*  < > 30.1* 31.2*  MCV 83.0  < > 86.8  < > 86.0 84.8  PLT 272  < > 225  < > 214 266  < > = values in this interval not displayed.  CBG:  Recent Labs Lab 10/21/15 2040 10/21/15 2347 10/22/15 0341 10/22/15 0836 10/22/15 1157 10/22/15 2215  GLUCAP 136* 151* 124* 103* 130* 169*   Medications: I have reviewed the patient's current medications. Scheduled Meds: . ampicillin-sulbactam (UNASYN) IV  3 g Intravenous Q6H  . antiseptic oral rinse  7 mL Mouth Rinse BID  . heparin subcutaneous  5,000 Units Subcutaneous 3 times per day  . Influenza vac split quadrivalent PF  0.5 mL Intramuscular Tomorrow-1000  . insulin aspart  0-15 Units Subcutaneous TID AC & HS  . ipratropium-albuterol  3 mL Nebulization TID  .  pantoprazole (PROTONIX) IV  40 mg Intravenous Q24H  . pneumococcal 23 valent vaccine  0.5 mL Intramuscular Tomorrow-1000   Continuous Infusions: . sodium chloride 10 mL/hr at 10/21/15 1000   PRN Meds:.acetaminophen, albuterol, ondansetron **OR** ondansetron (ZOFRAN) IV   Antimicrobials: Zosyn 02/04 - 10/22/15 Vancomycin 02/07 - 10/21/15 Diflucan 02/07 - 10/21/15 Unasyn 10/22/15 - day 2  Assessment/Plan: 70 year old male with recently dx HTN and DM admitted with acute chole (POD# 5) who subsequently developed respiratory failure requiring intubation now extubated, improving on appropriate therapies and transferred back to [facility] for further management.  Abdominal pain, new distention and loss of bowel sounds overnight but did not tolerate NG attempts.   Acute hypoxemic respiratory failure:  Likely 2/2 to aspiration PNA and possibly atelectasis from decreased deep breathing in the setting of abdominal pain.  Extubated 10/21/15.  SpO2 98% on 2L and no respiratory complaints.  He does have significant wheezing this AM.  Abx narrowed to unasyn on 10/22/15.   - continue supplemental oxygen prn - continue Unasyn - duoneb now - vitals per routine and prn  Acute cholecystitis s/p laparoscopic choleycystectomy:  Negative intraoperative cholangiogram.  POD#5.  Now without bowel sounds.  + epigastric pain but unclear if worsening or improving.  No significant tenderness on palpation.  No peritoneal signs or severe abdominal pain to suggest SBO.  Possibly post-op ileus, need to r/o bile leak or biliary injury.  I suspect episode of hematemesis was actually due to irritation from multiple NG attempts.  This would also explain tongue, throat and nose irritation he is experiencing.  Oropharynx clear this AM, no evidence of bleeding, no subsequent vomiting.  We appreciate surgery's interventions and assistance with management of this patient - NPO - NG and CT abdomen have already been ordered by surgical  team - stat CBC - continue IV protonix, prn Zofran - gentle fluids  Hypertension:  Moderately elevated in the past 24 hours.  Likely abdominal pain exacerbating underlying HTN. Lisinopril was started earlier in admission because of co-morbid DM, however, it was help pre-op and since surgery due to low BP.   - continue to hold lisinopril because he will be getting contrast with CT abd - plan to resume ACEI prior to d/c - start low dose amlodipine, 2.5mg  daily  - pain control  Type 2 diabetes mellitus:  A1c 9.5 this admission.  He is on metformin at home.  On SSI-M currently.  CBGs 103 - 169 in past 24 hours.  Fasting AM CBG is 134.   - STOP SSI - M and START SSI - S while NPO - CBG checks q4h while NPO - resume ACEI and metformin just before or at discharge  GERD:  Continue protonix  Diet:  NPO  VTE ppx:  [place] heparin Code status:  Full  Dispo: Disposition is deferred at this time, awaiting improvement of current medical problems.  Anticipated discharge in approximately 2-3 day(s).   The patient does not have a current PCP (No primary care provider on file.) and does need an [facility] hospital follow-up appointment after discharge.  The patient does not have transportation limitations that hinder transportation to clinic appointments.  .Services Needed at time of discharge: Y = Yes, Blank = No PT:   OT:   RN:   Equipment:   Other:     LOS: 6 days   Yolanda Manges, DO 10/23/2015, 7:20 AM

## 2015-10-23 NOTE — Progress Notes (Signed)
SLP Cancellation Note  Patient Details Name: Francisco [name] MRN: [id number] DOB: 1946/06/09   Cancelled treatment:       Reason Eval/Treat Not Completed: Medical issues which prohibited therapy. Per chart review, pt now NPO due to ileus. Will continue to follow. Recommend MD consider barium swallow when pt is medically ready to more formally evaluate esophageal function as it may relate to recurrence of aspiration.   Maxcine Ham, M.A. CCC-SLP [phone]  Maxcine Ham 10/23/2015, 8:44 AM

## 2015-10-23 NOTE — Evaluation (Signed)
Physical Therapy Evaluation Patient Details Name: Francisco [name] MRN: [id number] DOB: 1945/10/12 Today's Date: 10/23/2015   History of Present Illness  Patient is a 70 yo male admitted 10/17/15 with acute cholecystitis.  Patient s/p lap cholecystectomy on 10/18/15.  On 10/19/15, patient with acute resp failure and intubated.  Extubated 10/21/15.   PMH:  HTN, DM  Clinical Impression  Patient presents with problems listed below.  Will benefit from acute PT to maximize functional independence prior to discharge home with family.  Do not anticipate any f/u PT needs at discharge.    Follow Up Recommendations No PT follow up;Supervision for mobility/OOB    Equipment Recommendations  None recommended by PT    Recommendations for Other Services       Precautions / Restrictions Precautions Precautions: Fall Restrictions Weight Bearing Restrictions: No      Mobility  Bed Mobility Overal bed mobility: Needs Assistance Bed Mobility: Sit to Supine       Sit to supine: Min guard   General bed mobility comments: Assist for safety only.  Transfers Overall transfer level: Needs assistance Equipment used: None Transfers: Sit to/from Stand Sit to Stand: Min guard         General transfer comment: Verbal cues for safety.  Assist for safety/balance.  Ambulation/Gait Ambulation/Gait assistance: Min assist Ambulation Distance (Feet): 40 Feet Assistive device: None (Pushing IV pole at times) Gait Pattern/deviations: Step-through pattern;Decreased stride length   Gait velocity interpretation: Below normal speed for age/gender General Gait Details: Patient slightly unsteady due to general weakness.  Min assist for balance.  Stairs            Wheelchair Mobility    Modified Rankin (Stroke Patients Only)       Balance                                             Pertinent Vitals/Pain Pain Assessment: No/denies pain    Home Living Family/patient expects to be  discharged to:: Private residence Living Arrangements: Spouse/significant other;Children Available Help at Discharge: Family;Available 24 hours/day Type of Home: House Home Access: Stairs to enter Entrance Stairs-Rails: Right Entrance Stairs-Number of Steps: 4   Home Equipment: None Additional Comments: Information from daughter    Prior Function Level of Independence: Independent               Hand Dominance        Extremity/Trunk Assessment   Upper Extremity Assessment: Overall WFL for tasks assessed           Lower Extremity Assessment: Generalized weakness         Communication   Communication: Prefers language other than English (Patient speaks Nepali-Hindi)  Cognition Arousal/Alertness: Awake/alert Behavior During Therapy: WFL for tasks assessed/performed Overall Cognitive Status: Difficult to assess                      General Comments      Exercises        Assessment/Plan    PT Assessment Patient needs continued PT services  PT Diagnosis Abnormality of gait;Generalized weakness   PT Problem List Decreased strength;Decreased activity tolerance;Decreased balance;Decreased mobility;Cardiopulmonary status limiting activity  PT Treatment Interventions DME instruction;Gait training;Stair training;Functional mobility training;Therapeutic activities;Balance training;Therapeutic exercise;Patient/family education   PT Goals (Current goals can be found in the Care Plan section) Acute Rehab PT Goals Patient Stated Goal:  None stated PT Goal Formulation: With patient/family Time For Goal Achievement: 10/30/15 Potential to Achieve Goals: Good    Frequency Min 3X/week   Barriers to discharge        Co-evaluation               End of Session Equipment Utilized During Treatment: Gait belt Activity Tolerance: Patient tolerated treatment well Patient left: in bed;with call bell/phone within reach;with family/visitor present (Transporter  for radiology present)           Time: 1610-9604 PT Time Calculation (min) (ACUTE ONLY): 12 min   Charges:   PT Evaluation $PT Eval Moderate Complexity: 1 Procedure     PT G CodesVena Austria 10/26/2015, 8:20 PM Durenda Hurt. Renaldo Fiddler, [facility] Acute Rehab Services Pager [phone]

## 2015-10-23 NOTE — Progress Notes (Signed)
UR COMPLETED  

## 2015-10-24 ENCOUNTER — Inpatient Hospital Stay (HOSPITAL_COMMUNITY): Payer: Medicare Other

## 2015-10-24 LAB — COMPREHENSIVE METABOLIC PANEL
ALT: 63 U/L (ref 17–63)
ANION GAP: 18 — AB (ref 5–15)
AST: 114 U/L — ABNORMAL HIGH (ref 15–41)
Albumin: 2.3 g/dL — ABNORMAL LOW (ref 3.5–5.0)
Alkaline Phosphatase: 331 U/L — ABNORMAL HIGH (ref 38–126)
BILIRUBIN TOTAL: 1.7 mg/dL — AB (ref 0.3–1.2)
BUN: 7 mg/dL (ref 6–20)
CO2: 19 mmol/L — ABNORMAL LOW (ref 22–32)
Calcium: 8.7 mg/dL — ABNORMAL LOW (ref 8.9–10.3)
Chloride: 104 mmol/L (ref 101–111)
Creatinine, Ser: 0.95 mg/dL (ref 0.61–1.24)
GFR calc Af Amer: >60 mL/min (ref >60)
Glucose, Bld: 138 mg/dL — ABNORMAL HIGH (ref 65–99)
POTASSIUM: 3.1 mmol/L — AB (ref 3.5–5.1)
Sodium: 141 mmol/L (ref 135–145)
TOTAL PROTEIN: 6.5 g/dL (ref 6.5–8.1)

## 2015-10-24 LAB — GLUCOSE, CAPILLARY
GLUCOSE-CAPILLARY: 120 mg/dL — AB (ref 65–99)
GLUCOSE-CAPILLARY: 131 mg/dL — AB (ref 65–99)
Glucose-Capillary: 112 mg/dL — ABNORMAL HIGH (ref 65–99)
Glucose-Capillary: 116 mg/dL — ABNORMAL HIGH (ref 65–99)
Glucose-Capillary: 146 mg/dL — ABNORMAL HIGH (ref 65–99)
Glucose-Capillary: 135 mg/dL — ABNORMAL HIGH (ref 65–99)

## 2015-10-24 MED ORDER — HYDRALAZINE HCL 20 MG/ML IJ SOLN
10.0000 mg | Freq: Three times a day (TID) | INTRAMUSCULAR | Status: DC
Start: 2015-10-24 — End: 2015-10-25
  Administered 2015-10-24 – 2015-10-25 (×3): 10 mg via INTRAVENOUS
  Filled 2015-10-24 (×3): qty 1

## 2015-10-24 MED ORDER — IOHEXOL 300 MG/ML  SOLN
100.0000 mL | Freq: Once | INTRAMUSCULAR | Status: AC | PRN
  Administered 2015-10-24: 100 mL via INTRAVENOUS

## 2015-10-24 MED ORDER — IPRATROPIUM-ALBUTEROL 0.5-2.5 (3) MG/3ML IN SOLN
3.0000 mL | Freq: Three times a day (TID) | RESPIRATORY_TRACT | Status: DC
  Administered 2015-10-24 – 2015-10-25 (×5): 3 mL via RESPIRATORY_TRACT
  Filled 2015-10-24 (×5): qty 3

## 2015-10-24 MED ORDER — HYDRALAZINE HCL 20 MG/ML IJ SOLN
5.0000 mg | Freq: Three times a day (TID) | INTRAMUSCULAR | Status: DC
  Administered 2015-10-24: 5 mg via INTRAVENOUS
  Filled 2015-10-24: qty 1

## 2015-10-24 NOTE — Progress Notes (Signed)
[facility] Progress Note  6 Days Post-Op  Subjective: Had a BM yesterday.  No N/V.  NPO with NG output, nurses say there were 461m drainage/24hr.  Urinating well.  Granddaughters say he is still confused.  He know's he's in the hospital.  He denies any pain in his abdomen.  He c/o SOB and dry mouth this am.    Objective: Vital signs in last 24 hours: Temp:  [97.5 F (36.4 C)-98.8 F (37.1 C)] 98.8 F (37.1 C) (02/11 0335) Pulse Rate:  [84-92] 92 (02/11 0335) Resp:  [18] 18 (02/11 0335) BP: (154-174)/(69-94) 174/94 mmHg (02/11 0736) SpO2:  [94 %-98 %] 94 % (02/11 0903) FiO2 (%):  [21 %-28 %] 21 % (02/11 0903) Weight:  [73.6 kg (162 lb 4.1 oz)] 73.6 kg (162 lb 4.1 oz) (02/11 0456) Last BM Date: 10/23/15  Intake/Output from previous day: 02/10 0701 - 02/11 0700 In: 3200 [I.V.:1340; NG/GT:960; IV Piggyback:900] Out: 1750 [Urine:1750] Intake/Output this shift:    PE: Gen:  Alert, NAD, pleasant Abd: Soft, NT, mild distension, diminished BS, no HSM, incisions C/D/I, NG with dark green output  Lab Results:   Recent Labs  10/22/15 0931 10/23/15 0855  WBC 16.1* 12.7*  HGB 10.5* 10.6*  HCT 31.2* 31.7*  PLT 266 320   BMET  Recent Labs  10/22/15 0931 10/23/15 1000  NA 144 143  K 3.8 3.4*  CL 111 108  CO2 22 24  GLUCOSE 119* 158*  BUN 14 12  CREATININE 1.05 0.97  CALCIUM 8.2* 8.3*   PT/INR No results for input(s): LABPROT, INR in the last 72 hours. CMP     Component Value Date/Time   NA 143 10/23/2015 1000   K 3.4* 10/23/2015 1000   CL 108 10/23/2015 1000   CO2 24 10/23/2015 1000   GLUCOSE 158* 10/23/2015 1000   BUN 12 10/23/2015 1000   CREATININE 0.97 10/23/2015 1000   CALCIUM 8.3* 10/23/2015 1000   PROT 6.1* 10/23/2015 0740   ALBUMIN 2.0* 10/23/2015 0740   AST 81* 10/23/2015 0740   ALT 43 10/23/2015 0740   ALKPHOS 253* 10/23/2015 0740   BILITOT 1.7* 10/23/2015 0740   GFRNONAA >60 10/23/2015 1000   GFRAA >60 10/23/2015 1000   Lipase      Component Value Date/Time   LIPASE 38 10/17/2015 0335       Studies/Results: Dg Abd 1 View  10/24/2015  CLINICAL DATA:  Feeding tube placement EXAM: ABDOMEN - 1 VIEW COMPARISON:  October 19, 2015 FLUOROSCOPY TIME:  0 minutes 22 seconds; 2 submitted images FINDINGS: Feeding tube extends into the second portion of the duodenum. In the second portion of the duodenum, the tube loops upon itself with the tip directed toward the first portion of the duodenum but still remaining within the second portion of the duodenum. Contrast injection shows contrast flowing through the duodenum into the proximal jejunum. Visualized bowel gas pattern is unremarkable. IMPRESSION: Feeding tube tip in second portion of the duodenum, directed toward the first portion of the duodenum. Electronically Signed   By: WLowella GripIII M.D.   On: 10/24/2015 07:04   Ct Abdomen Pelvis W Contrast  10/24/2015  CLINICAL DATA:  Cholecystectomy, now with nausea and vomiting. EXAM: CT ABDOMEN AND PELVIS WITH CONTRAST TECHNIQUE: Multidetector CT imaging of the abdomen and pelvis was performed using the standard protocol following bolus administration of intravenous contrast. CONTRAST:  1037mOMNIPAQUE IOHEXOL 300 MG/ML  SOLN COMPARISON:  10/17/2015 FINDINGS: Lower chest and abdominal wall: Unremarkable  right upper quadrant incision site. Small pleural effusions with bibasilar atelectasis. Especially in the left lower lobe, there may be superimposed airspace disease/ pneumonia. Hepatobiliary: Hepatic steatosis with perfusion anomaly or fatty sparing around the gallbladder fossa. Cholecystectomy with 7 cm fluid and gas containing collection in the gallbladder fossa. Dependently semi-solid material with clustered gas bubbles is likely hemostatic material. No duct distension. No diffuse peritoneal fluid and thickening suggestive of bile peritonitis. Pancreas: Unremarkable. Spleen: Unremarkable. Adrenals/Urinary Tract: Negative adrenals. No  hydronephrosis or stone. Unremarkable bladder with Foley catheter in place. Reproductive:No pathologic findings. Stomach/Bowel: No obstruction or ileus. Feeding tube with tip coiled in the mid duodenum, appearance stable from placement fluoroscopy. No appendicitis. Vascular/Lymphatic: No acute vascular abnormality. No mass or adenopathy. Peritoneal: Small pelvic peritoneal fluid. Musculoskeletal: No acute abnormalities. IMPRESSION: 1. 7 cm gallbladder fossa collection which could reflect hematoma, abscess, and/or biloma. 2. Bibasilar atelectasis with small effusions. Possible superimposed pneumonia or aspiration in the left lower lobe. Electronically Signed   By: Monte Fantasia M.D.   On: 10/24/2015 08:05   Dg Addison Bailey G Tube Plc W/fl-no Rad  10/23/2015  CLINICAL DATA:  NASO G TUBE PLACEMENT WITH FLUORO Fluoroscopy was utilized by the requesting physician.  No radiographic interpretation.    Anti-infectives: Anti-infectives    Start     Dose/Rate Route Frequency Ordered Stop   10/22/15 0930  Ampicillin-Sulbactam (UNASYN) 3 g in sodium chloride 0.9 % 100 mL IVPB     3 g 100 mL/hr over 60 Minutes Intravenous Every 6 hours 10/22/15 0920     10/20/15 2300  fluconazole (DIFLUCAN) IVPB 200 mg  Status:  Discontinued     200 mg 100 mL/hr over 60 Minutes Intravenous Every 24 hours 10/20/15 0012 10/22/15 0918   10/20/15 0015  vancomycin (VANCOCIN) IVPB 750 mg/150 ml premix  Status:  Discontinued     750 mg 150 mL/hr over 60 Minutes Intravenous 2 times daily 10/20/15 0012 10/22/15 0920   10/20/15 0015  fluconazole (DIFLUCAN) IVPB 400 mg     400 mg 100 mL/hr over 120 Minutes Intravenous  Once 10/20/15 0012 10/20/15 0521   10/19/15 1400  piperacillin-tazobactam (ZOSYN) IVPB 3.375 g  Status:  Discontinued     3.375 g 12.5 mL/hr over 240 Minutes Intravenous 3 times per day 10/19/15 1230 10/22/15 0920   10/17/15 1400  piperacillin-tazobactam (ZOSYN) IVPB 3.375 g  Status:  Discontinued     3.375 g 12.5 mL/hr  over 240 Minutes Intravenous 3 times per day 10/17/15 1334 10/19/15 1116   10/17/15 0600  piperacillin-tazobactam (ZOSYN) IVPB 3.375 g     3.375 g 100 mL/hr over 30 Minutes Intravenous  Once 10/17/15 0553 10/17/15 0743       Assessment/Plan Acute cholecystitis  POD #6 s/p lap chole with NEG IOC---Dr. Kieth Brightly Post op ileus -NPO, NGT to LIWS. -Bili, ALT/AST improved yesterday, alk phos up to 253, doubt bile leak -GB Fossa fluid collection - likely hematoma give post-operative blood loss which will usually resolve on its own.  Would be hesitant to drain it unless great concern for infection which I doubt at this time -Limit pain meds due to confusion and aspiration ABL Anemia-stable now, but lost 3 grams after surgery Acute hypoxic respiratory failure/aspiration-extubated 2/8. pulm toilets, nebs ID-Vanz/zosyn/fluconazole changed Unasyn for PNA VTE prophylaxis-SCD/heparin FEN-NPO, IVF per primary team Dispo-ileus, continue NG for now, hopefully clamping trials tomorrow    LOS: 7 days    Nat Christen 10/24/2015, 10:01 AM Pager: [phone]

## 2015-10-24 NOTE — Progress Notes (Signed)
Subjective:  Patient was seen and examined this morning. History was obtained through interpreter phone and through patient's granddaughter. Patient is feeling better today- he denies abdominal pain, nausea. He continues to have some short of breath and productive cough of yellow sputum, both of which are improved from prior. He had a large bowel movement this morning and also urinating well. His main complaints are his cough and dryness of mouth.   His granddaughter and family feel that patient is more confused than normal. However, he answers questions appropriately.   Objective: Vital signs in last 24 hours: Filed Vitals:   10/24/15 0335 10/24/15 0456 10/24/15 0736 10/24/15 0903  BP: 173/90  174/94   Pulse: 92     Temp: 98.8 F (37.1 C)     TempSrc: Oral     Resp: 18     Height:      Weight:  162 lb 4.1 oz (73.6 kg)    SpO2: 94%   94%   Weight change: -3 lb 4.9 oz (-1.5 kg)  Intake/Output Summary (Last 24 hours) at 10/24/15 0918 Last data filed at 10/24/15 0600  Gross per 24 hour  Intake   3200 ml  Output   1750 ml  Net   1450 ml   General: Vital signs reviewed.  Patient is well-developed and well-nourished, in no acute distress and cooperative with exam.  HEENT: NGT in place Cardiovascular: RRR, S1 normal, S2 normal. Pulmonary/Chest: Mild expiratory wheezes, inspiratory crackles in lung bases bilaterally. No increased respiratory effort, no accessory muscle use, speaking in complete sentences, not on oxygen. Abdominal: Soft, non-tender, non-distended, hypoactive BS, no guarding present.  Extremities: No lower extremity edema bilaterally, pulses symmetric and intact bilaterally.  Neurological: Non-focal, answering questions appropriately.  Skin: Warm, dry and intact.   Lab Results: Basic Metabolic Panel:  Recent Labs Lab 10/21/15 0250 10/22/15 0931 10/23/15 1000  NA 141 144 143  K 3.3* 3.8 3.4*  CL 110 111 108  CO2 GLUCOSE 117* 119* 158*  BUN CREATININE 1.19 1.05 0.97  CALCIUM 7.2* 8.2* 8.3*  MG 1.9 2.2  --   PHOS 1.5* 2.3*  --    Liver Function Tests:  Recent Labs Lab 10/21/15 0250 10/23/15 0740  AST 32 81*  ALT 27 43  ALKPHOS 64 253*  BILITOT 2.6* 1.7*  PROT 5.0* 6.1*  ALBUMIN 1.9* 2.0*   CBC:  Recent Labs Lab 10/20/15 0222  10/22/15 0931 10/23/15 0855  WBC 12.0*  < > 16.1* 12.7*  NEUTROABS 9.5*  --   --   --   HGB 12.3*  < > 10.5* 10.6*  HCT 38.7*  < > 31.2* 31.7*  MCV 86.8  < > 84.8 85.0  PLT 225  < > 266 320  < > = values in this interval not displayed. Cardiac Enzymes:  Recent Labs Lab 10/19/15 1649 10/20/15 0050  TROPONINI 0.04* 0.06*   CBG:  Recent Labs Lab 10/23/15 1144 10/23/15 1751 10/23/15 2050 10/24/15 0016 10/24/15 0345 10/24/15 0730  GLUCAP 138* 124* 113* 120* 112* 116*   Hemoglobin A1C:  Recent Labs Lab 10/17/15 1355  HGBA1C 9.5*   Coagulation:  Recent Labs Lab 10/20/15 0222  LABPROT 16.6*  INR 1.33   Urinalysis:  Recent Labs Lab 10/20/15 0120  COLORURINE AMBER*  LABSPEC 1.024  PHURINE 5.0  GLUCOSEU 100*  HGBUR SMALL*  BILIRUBINUR SMALL*  KETONESUR 15*  PROTEINUR 30*  NITRITE NEGATIVE  LEUKOCYTESUR NEGATIVE  Micro Results: Recent Results (from the past 240 hour(s))  Culture, blood (routine x 2)     Status: None   Collection Time: 10/17/15  1:55 PM  Result Value Ref Range Status   Specimen Description BLOOD RESISTANT HAND  Final   Special Requests BOTTLES DRAWN AEROBIC ONLY  8CC  Final   Culture NO GROWTH 5 DAYS  Final   Report Status 10/22/2015 FINAL  Final  Culture, blood (routine x 2)     Status: None   Collection Time: 10/17/15  1:56 PM  Result Value Ref Range Status   Specimen Description BLOOD RIGHT ANTECUBITAL  Final   Special Requests BOTTLES DRAWN AEROBIC AND ANAEROBIC  10CC  Final   Culture NO GROWTH 5 DAYS  Final   Report Status 10/22/2015 FINAL  Final  Surgical pcr screen     Status: Abnormal   Collection Time: 10/17/15   8:55 PM  Result Value Ref Range Status   MRSA, PCR NEGATIVE NEGATIVE Final   Staphylococcus aureus POSITIVE (A) NEGATIVE Final    Comment:        The Xpert SA Assay (FDA approved for NASAL specimens in patients over 28 years of age), is one component of a comprehensive surveillance program.  Test performance has been validated by [facility] for patients greater than or equal to 16 year old. It is not intended to diagnose infection nor to guide or monitor treatment.   MRSA PCR Screening     Status: None   Collection Time: 10/20/15  1:20 AM  Result Value Ref Range Status   MRSA by PCR NEGATIVE NEGATIVE Final    Comment:        The GeneXpert MRSA Assay (FDA approved for NASAL specimens only), is one component of a comprehensive MRSA colonization surveillance program. It is not intended to diagnose MRSA infection nor to guide or monitor treatment for MRSA infections.   Culture, blood (x 2)     Status: None (Preliminary result)   Collection Time: 10/20/15  2:11 AM  Result Value Ref Range Status   Specimen Description BLOOD RIGHT ANTECUBITAL  Final   Special Requests BOTTLES DRAWN AEROBIC AND ANAEROBIC 5CC  Final   Culture NO GROWTH 3 DAYS  Final   Report Status PENDING  Incomplete  Culture, blood (x 2)     Status: None (Preliminary result)   Collection Time: 10/20/15  2:22 AM  Result Value Ref Range Status   Specimen Description BLOOD RIGHT ANTECUBITAL  Final   Special Requests BOTTLES DRAWN AEROBIC AND ANAEROBIC 5CC  Final   Culture NO GROWTH 3 DAYS  Final   Report Status PENDING  Incomplete  Culture, respiratory (NON-Expectorated)     Status: None   Collection Time: 10/20/15  3:25 AM  Result Value Ref Range Status   Specimen Description TRACHEAL ASPIRATE  Final   Special Requests NONE  Final   Gram Stain   Final    FEW WBC PRESENT,BOTH PMN AND MONONUCLEAR NO SQUAMOUS EPITHELIAL CELLS SEEN FEW YEAST Performed at [facility]    Culture   Final    FEW  YEAST CONSISTENT WITH CANDIDA SPECIES Performed at [facility]    Report Status 10/22/2015 FINAL  Final   Studies/Results: Dg Abd 1 View  10/24/2015  CLINICAL DATA:  Feeding tube placement EXAM: ABDOMEN - 1 VIEW COMPARISON:  October 19, 2015 FLUOROSCOPY TIME:  0 minutes 22 seconds; 2 submitted images FINDINGS: Feeding tube extends into the second portion of the duodenum.  In the second portion of the duodenum, the tube loops upon itself with the tip directed toward the first portion of the duodenum but still remaining within the second portion of the duodenum. Contrast injection shows contrast flowing through the duodenum into the proximal jejunum. Visualized bowel gas pattern is unremarkable. IMPRESSION: Feeding tube tip in second portion of the duodenum, directed toward the first portion of the duodenum. Electronically Signed   By: Bretta Bang III M.D.   On: 10/24/2015 07:04   Ct Abdomen Pelvis W Contrast  10/24/2015  CLINICAL DATA:  Cholecystectomy, now with nausea and vomiting. EXAM: CT ABDOMEN AND PELVIS WITH CONTRAST TECHNIQUE: Multidetector CT imaging of the abdomen and pelvis was performed using the standard protocol following bolus administration of intravenous contrast. CONTRAST:  OMNIPAQUE IOHEXOL 300 MG/ML  SOLN COMPARISON:  10/17/2015 FINDINGS: Lower chest and abdominal wall: Unremarkable right upper quadrant incision site. Small pleural effusions with bibasilar atelectasis. Especially in the left lower lobe, there may be superimposed airspace disease/ pneumonia. Hepatobiliary: Hepatic steatosis with perfusion anomaly or fatty sparing around the gallbladder fossa. Cholecystectomy with 7 cm fluid and gas containing collection in the gallbladder fossa. Dependently semi-solid material with clustered gas bubbles is likely hemostatic material. No duct distension. No diffuse peritoneal fluid and thickening suggestive of bile peritonitis. Pancreas: Unremarkable. Spleen:  Unremarkable. Adrenals/Urinary Tract: Negative adrenals. No hydronephrosis or stone. Unremarkable bladder with Foley catheter in place. Reproductive:No pathologic findings. Stomach/Bowel: No obstruction or ileus. Feeding tube with tip coiled in the mid duodenum, appearance stable from placement fluoroscopy. No appendicitis. Vascular/Lymphatic: No acute vascular abnormality. No mass or adenopathy. Peritoneal: Small pelvic peritoneal fluid. Musculoskeletal: No acute abnormalities. IMPRESSION: 1. 7 cm gallbladder fossa collection which could reflect hematoma, abscess, and/or biloma. 2. Bibasilar atelectasis with small effusions. Possible superimposed pneumonia or aspiration in the left lower lobe. Electronically Signed   By: Marnee Spring M.D.   On: 10/24/2015 08:05   Dg Vangie Bicker G Tube Plc W/fl-no Rad  10/23/2015  CLINICAL DATA:  NASO G TUBE PLACEMENT WITH FLUORO Fluoroscopy was utilized by the requesting physician.  No radiographic interpretation.   Medications:  I have reviewed the patient's current medications. Prior to Admission:  Prescriptions prior to admission  Medication Sig Dispense Refill Last Dose  . cyclobenzaprine (FLEXERIL) 10 MG tablet Take 10 mg by mouth 3 (three) times daily as needed for muscle spasms.   Past Month at Unknown time  . HYDROcodone-homatropine (HYCODAN) 5-1.5 MG/5ML syrup Take 5 mLs by mouth every 4 (four) hours as needed for cough.   unk  . metFORMIN (GLUCOPHAGE) 500 MG tablet Take 500 mg by mouth 2 (two) times daily with a meal.   Past Week at Unknown time  . naproxen (NAPROSYN) 500 MG tablet Take 500 mg by mouth 2 (two) times daily as needed for mild pain.   Past Month at Unknown time   Scheduled Meds: . ampicillin-sulbactam (UNASYN) IV  3 g Intravenous Q6H  . antiseptic oral rinse  7 mL Mouth Rinse BID  . heparin subcutaneous  5,000 Units Subcutaneous 3 times per day  . hydrALAZINE  5 mg Intravenous 3 times per day  . Influenza vac split quadrivalent PF  0.5 mL  Intramuscular Tomorrow-1000  . insulin aspart  0-9 Units Subcutaneous 6 times per day  . ipratropium-albuterol  3 mL Nebulization TID  . lisinopril  10 mg Oral Daily  . pantoprazole (PROTONIX) IV  40 mg Intravenous Q24H  . pneumococcal 23 valent vaccine  0.5 mL  Intramuscular Tomorrow-1000   Continuous Infusions: . sodium chloride 75 mL/hr at 10/23/15 1900   PRN Meds:.acetaminophen, albuterol, ondansetron **OR** ondansetron (ZOFRAN) IV Assessment/Plan: Principal Problem:   Acute cholecystitis Active Problems:   GERD   Hypertension   Type 2 diabetes mellitus (HCC)   Cholelithiasis and acute cholecystitis without obstruction   Acute hypoxemic respiratory failure (HCC)   Abdominal pain   Dyspnea   Chest pain   Surgery, elective   S/P cholecystectomy   Pulmonary atelectasis  70 year old male with recently dx HTN and DM admitted with acute chole (POD# 5) who subsequently developed respiratory failure requiring intubation now extubated, improving on appropriate therapies and transferred back to [facility] for further management. Abdominal pain, new distention and loss of bowel sounds overnight but did not tolerate NG attempts.  Acute Hypoxemic Respiratory Failure: Likely 2/2 to aspiration PNA and atelectasis. Patient was extubated on 10/21/15. Patient continues to be more short of breath than baseline; however, he is improving clinically. He is satting 94% on room air this morning. He continues to have a productive cough. On exam he has mild expiratory wheezing and inspiratory crackles. -Wean Oxygen -Out of bed to chair -Ambulate with pulse ox -Incentive spirometer -Unasyn 2/9>> -Duoneb Q6H  Acute Cholecystitis s/p Laparoscopic CCY: Negative intraoperative cholangiogram. Given concern for post-op ileus yesterday, patient was made NPO with NGT placed and CT abdomen obtained. CT abdomen revealed 7 cm gallbladder fossa collection which could reflect hematoma, abscess, and/or biloma.  Clincally, patient's abdomen is benign. He denies pain and abdominal exam is non-tender, non-distended-improved from yesterday. He had a large bowel movement this morning. He denies nausea. His bowel sounds are decreased on exam, but likely due to NPO status. Per surgery, may need HIDA to r/o bile leak or biliary injury.We appreciate surgery's interventions and assistance with management of this patient -NPO -NGT  -Further management per surgery whether further intervention or evaluation is needed with HIDA -Continue IV protonix -Zofran prn -Continue NS 75 cc/hr while NPO  HTN: 170/90 this morning. -Hydralazine 5 mg IV Q8H while NPO -Restart lisinopril when able  T2DM: A1c 9.5 this admission.He is on metformin at home. -SSI-S Q4H while NPO -CBG checks q4h while NPO -Resume Metformin at discharge  GERD: Continue protonix. -Protonix IV QD  Diet: NPO  VTE ppx: [place] heparin Code status: Full  Dispo: Disposition is deferred at this time, awaiting improvement of current medical problems.  Anticipated discharge in approximately 2-3 day(s).   The patient does not have a current PCP (No primary care provider on file.) and does need an [facility] hospital follow-up appointment after discharge.  The patient does have transportation limitations that hinder transportation to clinic appointments.   LOS: 7 days   Jill Alexanders, DO PGY-2 Internal Medicine Resident Pager # [phone] 10/24/2015 9:18 AM

## 2015-10-24 NOTE — Progress Notes (Signed)
Pt. Blood pressure 188/108, MD paged, awaiting response.

## 2015-10-24 NOTE — Progress Notes (Signed)
Orders updated. Hydralazine changed from  to 10 mg.

## 2015-10-24 NOTE — Progress Notes (Signed)
SATURATION QUALIFICATIONS: (This note is used to comply with regulatory documentation for home oxygen)  Patient Saturations on Room Air at Rest = 98%  Patient Saturations on Room Air while Ambulating = 94-96%

## 2015-10-25 LAB — COMPREHENSIVE METABOLIC PANEL
ALBUMIN: 2.3 g/dL — AB (ref 3.5–5.0)
ALT: 51 U/L (ref 17–63)
ANION GAP: 19 — AB (ref 5–15)
AST: 69 U/L — ABNORMAL HIGH (ref 15–41)
Alkaline Phosphatase: 252 U/L — ABNORMAL HIGH (ref 38–126)
BUN: 11 mg/dL (ref 6–20)
CHLORIDE: 111 mmol/L (ref 101–111)
CO2: 16 mmol/L — AB (ref 22–32)
Calcium: 8.9 mg/dL (ref 8.9–10.3)
Creatinine, Ser: 1.07 mg/dL (ref 0.61–1.24)
GFR calc non Af Amer: >60 mL/min (ref >60)
GLUCOSE: 166 mg/dL — AB (ref 65–99)
POTASSIUM: 3.3 mmol/L — AB (ref 3.5–5.1)
SODIUM: 146 mmol/L — AB (ref 135–145)
Total Bilirubin: 1.4 mg/dL — ABNORMAL HIGH (ref 0.3–1.2)
Total Protein: 6 g/dL — ABNORMAL LOW (ref 6.5–8.1)

## 2015-10-25 LAB — GLUCOSE, CAPILLARY
GLUCOSE-CAPILLARY: 144 mg/dL — AB (ref 65–99)
GLUCOSE-CAPILLARY: 147 mg/dL — AB (ref 65–99)
GLUCOSE-CAPILLARY: 173 mg/dL — AB (ref 65–99)
GLUCOSE-CAPILLARY: 204 mg/dL — AB (ref 65–99)
Glucose-Capillary: 200 mg/dL — ABNORMAL HIGH (ref 65–99)
Glucose-Capillary: 248 mg/dL — ABNORMAL HIGH (ref 65–99)

## 2015-10-25 LAB — CULTURE, BLOOD (ROUTINE X 2)
Culture: NO GROWTH
Culture: NO GROWTH

## 2015-10-25 LAB — CBC
HEMATOCRIT: 34.7 % — AB (ref 39.0–52.0)
HEMOGLOBIN: 11.6 g/dL — AB (ref 13.0–17.0)
MCH: 27.4 pg (ref 26.0–34.0)
MCHC: 33.4 g/dL (ref 30.0–36.0)
MCV: 82 fL (ref 78.0–100.0)
Platelets: 486 10*3/uL — ABNORMAL HIGH (ref 150–400)
RBC: 4.23 MIL/uL (ref 4.22–5.81)
RDW: 14.5 % (ref 11.5–15.5)
WBC: 11.3 10*3/uL — ABNORMAL HIGH (ref 4.0–10.5)

## 2015-10-25 MED ORDER — INSULIN ASPART 100 UNIT/ML ~~LOC~~ SOLN
0.0000 [IU] | Freq: Three times a day (TID) | SUBCUTANEOUS | Status: DC
  Administered 2015-10-26: 3 [IU] via SUBCUTANEOUS
  Administered 2015-10-26: 2 [IU] via SUBCUTANEOUS

## 2015-10-25 MED ORDER — HYDRALAZINE HCL 20 MG/ML IJ SOLN: 10.0000 mg | Freq: Three times a day (TID) | INTRAMUSCULAR | Status: DC | PRN

## 2015-10-25 MED ORDER — HYDRALAZINE HCL 20 MG/ML IJ SOLN
10.0000 mg | Freq: Once | INTRAMUSCULAR | Status: AC
  Administered 2015-10-25: 10 mg via INTRAVENOUS
  Filled 2015-10-25: qty 1

## 2015-10-25 MED ORDER — PNEUMOCOCCAL 13-VAL CONJ VACC IM SUSP
0.5000 mL | INTRAMUSCULAR | Status: AC | PRN
  Administered 2015-10-26: 0.5 mL via INTRAMUSCULAR
  Filled 2015-10-25: qty 0.5

## 2015-10-25 MED ORDER — POTASSIUM CHLORIDE 10 MEQ/100ML IV SOLN
10.0000 meq | INTRAVENOUS | Status: AC
  Administered 2015-10-25 (×4): 10 meq via INTRAVENOUS
  Filled 2015-10-25 (×4): qty 100

## 2015-10-25 MED ORDER — HYDRALAZINE HCL 20 MG/ML IJ SOLN
20.0000 mg | Freq: Three times a day (TID) | INTRAMUSCULAR | Status: DC
Start: 2015-10-25 — End: 2015-10-25
  Administered 2015-10-25: 20 mg via INTRAVENOUS
  Filled 2015-10-25: qty 1

## 2015-10-25 MED ORDER — IPRATROPIUM-ALBUTEROL 0.5-2.5 (3) MG/3ML IN SOLN: 3.0000 mL | Freq: Four times a day (QID) | RESPIRATORY_TRACT | Status: DC | PRN

## 2015-10-25 MED ORDER — INSULIN ASPART 100 UNIT/ML ~~LOC~~ SOLN
0.0000 [IU] | Freq: Every day | SUBCUTANEOUS | Status: DC
  Administered 2015-10-25: 2 [IU] via SUBCUTANEOUS

## 2015-10-25 MED ORDER — AMOXICILLIN-POT CLAVULANATE 875-125 MG PO TABS
1.0000 | ORAL_TABLET | Freq: Two times a day (BID) | ORAL | Status: DC
  Administered 2015-10-25 – 2015-10-26 (×2): 1 via ORAL
  Filled 2015-10-25 (×2): qty 1

## 2015-10-25 NOTE — Progress Notes (Signed)
Protocol done and patient scored a 5. Changed to PRN duo neb and will continue to monitor for any changes

## 2015-10-25 NOTE — Progress Notes (Signed)
Subjective:  Patient did not have any complaints. He had pulled his NGT out half way- I called surgery to see if it needs to be replaced  He denies n/v. Had bowel movement yesterday. He denies any abd pain. Says he is passing flatus.  2 sons present at bedside.  Objective: Vital signs in last 24 hours: Filed Vitals:   10/24/15 2020 10/24/15 2140 10/25/15 0442 10/25/15 0742  BP:  160/98 165/90 144/88  Pulse:  115 100 107  Temp:  99 F (37.2 C) 99.2 F (37.3 C)   TempSrc:  Oral Oral   Resp:  18 18   Height:      Weight:   159 lb 13.3 oz (72.5 kg)   SpO2: 97% 97% 95%    Weight change: -2 lb 6.8 oz (-1.1 kg)  Intake/Output Summary (Last 24 hours) at 10/25/15 1037 Last data filed at 10/25/15 0900  Gross per 24 hour  Intake    200 ml  Output   3650 ml  Net  -3450 ml   General: Vital signs reviewed.  Patient resting in bed HEENT: NGT in place, halfway out Cardiovascular: RRR, S1 normal, S2 normal. Pulmonary/Chest:  Ctab, no wheezing or crackles Abdominal: Soft, non-tender, non-distended, hypoactive BS, no guarding present.  Extremities: No lower extremity edema bilaterally, pulses symmetric and intact  Neuro: pt alert to name, and place   Lab Results: Basic Metabolic Panel:  Recent Labs Lab 10/21/15 0250 10/22/15 0931  10/24/15 1012 10/25/15 0507  NA 141 144  < > 141 146*  K 3.3* 3.8  < > 3.1* 3.3*  CL 110 111  < > 104 111  CO2 23 22  < > 19* 16*  GLUCOSE 117* 119*  < > 138* 166*  BUN 15 14  < > 7 11  CREATININE 1.19 1.05  < > 0.95 1.07  CALCIUM 7.2* 8.2*  < > 8.7* 8.9  MG 1.9 2.2  --   --   --   PHOS 1.5* 2.3*  --   --   --   < > = values in this interval not displayed. Liver Function Tests:  Recent Labs Lab 10/24/15 1012 10/25/15 0507  AST 114* 69*  ALT 63 51  ALKPHOS 331* 252*  BILITOT 1.7* 1.4*  PROT 6.5 6.0*  ALBUMIN 2.3* 2.3*   CBC:  Recent Labs Lab 10/20/15 0222  10/23/15 0855 10/25/15 0507  WBC 12.0*  < > 12.7* 11.3*  NEUTROABS  9.5*  --   --   --   HGB 12.3*  < > 10.6* 11.6*  HCT 38.7*  < > 31.7* 34.7*  MCV 86.8  < > 85.0 82.0  PLT 225  < > 320 486*  < > = values in this interval not displayed. Cardiac Enzymes:  Recent Labs Lab 10/19/15 1649 10/20/15 0050  TROPONINI 0.04* 0.06*   CBG:  Recent Labs Lab 10/24/15 1150 10/24/15 1617 10/24/15 2018 10/25/15 0036 10/25/15 0438 10/25/15 0741  GLUCAP 131* 146* 135* 147* 144* 173*   Hemoglobin A1C: No results for input(s): HGBA1C in the last 168 hours. Coagulation:  Recent Labs Lab 10/20/15 0222  LABPROT 16.6*  INR 1.33   Urinalysis:  Recent Labs Lab 10/20/15 0120  COLORURINE AMBER*  LABSPEC 1.024  PHURINE 5.0  GLUCOSEU 100*  HGBUR SMALL*  BILIRUBINUR SMALL*  KETONESUR 15*  PROTEINUR 30*  NITRITE NEGATIVE  LEUKOCYTESUR NEGATIVE    Micro Results: Recent Results (from the past 240 hour(s))  Culture, blood (routine  x 2)     Status: None   Collection Time: 10/17/15  1:55 PM  Result Value Ref Range Status   Specimen Description BLOOD RESISTANT HAND  Final   Special Requests BOTTLES DRAWN AEROBIC ONLY  8CC  Final   Culture NO GROWTH 5 DAYS  Final   Report Status 10/22/2015 FINAL  Final  Culture, blood (routine x 2)     Status: None   Collection Time: 10/17/15  1:56 PM  Result Value Ref Range Status   Specimen Description BLOOD RIGHT ANTECUBITAL  Final   Special Requests BOTTLES DRAWN AEROBIC AND ANAEROBIC  10CC  Final   Culture NO GROWTH 5 DAYS  Final   Report Status 10/22/2015 FINAL  Final  Surgical pcr screen     Status: Abnormal   Collection Time: 10/17/15  8:55 PM  Result Value Ref Range Status   MRSA, PCR NEGATIVE NEGATIVE Final   Staphylococcus aureus POSITIVE (A) NEGATIVE Final    Comment:        The Xpert SA Assay (FDA approved for NASAL specimens in patients over 21 years of age), is one component of a comprehensive surveillance program.  Test performance has been validated by [facility] for patients greater than  or equal to 77 year old. It is not intended to diagnose infection nor to guide or monitor treatment.   MRSA PCR Screening     Status: None   Collection Time: 10/20/15  1:20 AM  Result Value Ref Range Status   MRSA by PCR NEGATIVE NEGATIVE Final    Comment:        The GeneXpert MRSA Assay (FDA approved for NASAL specimens only), is one component of a comprehensive MRSA colonization surveillance program. It is not intended to diagnose MRSA infection nor to guide or monitor treatment for MRSA infections.   Culture, blood (x 2)     Status: None (Preliminary result)   Collection Time: 10/20/15  2:11 AM  Result Value Ref Range Status   Specimen Description BLOOD RIGHT ANTECUBITAL  Final   Special Requests BOTTLES DRAWN AEROBIC AND ANAEROBIC 5CC  Final   Culture NO GROWTH 4 DAYS  Final   Report Status PENDING  Incomplete  Culture, blood (x 2)     Status: None (Preliminary result)   Collection Time: 10/20/15  2:22 AM  Result Value Ref Range Status   Specimen Description BLOOD RIGHT ANTECUBITAL  Final   Special Requests BOTTLES DRAWN AEROBIC AND ANAEROBIC 5CC  Final   Culture NO GROWTH 4 DAYS  Final   Report Status PENDING  Incomplete  Culture, respiratory (NON-Expectorated)     Status: None   Collection Time: 10/20/15  3:25 AM  Result Value Ref Range Status   Specimen Description TRACHEAL ASPIRATE  Final   Special Requests NONE  Final   Gram Stain   Final    FEW WBC PRESENT,BOTH PMN AND MONONUCLEAR NO SQUAMOUS EPITHELIAL CELLS SEEN FEW YEAST Performed at [facility]    Culture   Final    FEW YEAST CONSISTENT WITH CANDIDA SPECIES Performed at [facility]    Report Status 10/22/2015 FINAL  Final   Studies/Results: Dg Abd 1 View  10/24/2015  CLINICAL DATA:  Feeding tube placement EXAM: ABDOMEN - 1 VIEW COMPARISON:  October 19, 2015 FLUOROSCOPY TIME:  0 minutes 22 seconds; 2 submitted images FINDINGS: Feeding tube extends into the second portion of the  duodenum. In the second portion of the duodenum, the tube loops upon itself with  the tip directed toward the first portion of the duodenum but still remaining within the second portion of the duodenum. Contrast injection shows contrast flowing through the duodenum into the proximal jejunum. Visualized bowel gas pattern is unremarkable. IMPRESSION: Feeding tube tip in second portion of the duodenum, directed toward the first portion of the duodenum. Electronically Signed   By: Bretta Bang III M.D.   On: 10/24/2015 07:04   Ct Abdomen Pelvis W Contrast  10/24/2015  CLINICAL DATA:  Cholecystectomy, now with nausea and vomiting. EXAM: CT ABDOMEN AND PELVIS WITH CONTRAST TECHNIQUE: Multidetector CT imaging of the abdomen and pelvis was performed using the standard protocol following bolus administration of intravenous contrast. CONTRAST:  OMNIPAQUE IOHEXOL 300 MG/ML  SOLN COMPARISON:  10/17/2015 FINDINGS: Lower chest and abdominal wall: Unremarkable right upper quadrant incision site. Small pleural effusions with bibasilar atelectasis. Especially in the left lower lobe, there may be superimposed airspace disease/ pneumonia. Hepatobiliary: Hepatic steatosis with perfusion anomaly or fatty sparing around the gallbladder fossa. Cholecystectomy with 7 cm fluid and gas containing collection in the gallbladder fossa. Dependently semi-solid material with clustered gas bubbles is likely hemostatic material. No duct distension. No diffuse peritoneal fluid and thickening suggestive of bile peritonitis. Pancreas: Unremarkable. Spleen: Unremarkable. Adrenals/Urinary Tract: Negative adrenals. No hydronephrosis or stone. Unremarkable bladder with Foley catheter in place. Reproductive:No pathologic findings. Stomach/Bowel: No obstruction or ileus. Feeding tube with tip coiled in the mid duodenum, appearance stable from placement fluoroscopy. No appendicitis. Vascular/Lymphatic: No acute vascular abnormality. No mass or  adenopathy. Peritoneal: Small pelvic peritoneal fluid. Musculoskeletal: No acute abnormalities. IMPRESSION: 1. 7 cm gallbladder fossa collection which could reflect hematoma, abscess, and/or biloma. 2. Bibasilar atelectasis with small effusions. Possible superimposed pneumonia or aspiration in the left lower lobe. Electronically Signed   By: Marnee Spring M.D.   On: 10/24/2015 08:05   Dg Vangie Bicker G Tube Plc W/fl-no Rad  10/23/2015  CLINICAL DATA:  NASO G TUBE PLACEMENT WITH FLUORO Fluoroscopy was utilized by the requesting physician.  No radiographic interpretation.   Medications:  I have reviewed the patient's current medications. Prior to Admission:  Prescriptions prior to admission  Medication Sig Dispense Refill Last Dose  . cyclobenzaprine (FLEXERIL) 10 MG tablet Take 10 mg by mouth 3 (three) times daily as needed for muscle spasms.   Past Month at Unknown time  . HYDROcodone-homatropine (HYCODAN) 5-1.5 MG/5ML syrup Take 5 mLs by mouth every 4 (four) hours as needed for cough.   unk  . metFORMIN (GLUCOPHAGE) 500 MG tablet Take 500 mg by mouth 2 (two) times daily with a meal.   Past Week at Unknown time  . naproxen (NAPROSYN) 500 MG tablet Take 500 mg by mouth 2 (two) times daily as needed for mild pain.   Past Month at Unknown time   Scheduled Meds: . ampicillin-sulbactam (UNASYN) IV  3 g Intravenous Q6H  . antiseptic oral rinse  7 mL Mouth Rinse BID  . heparin subcutaneous  5,000 Units Subcutaneous 3 times per day  . hydrALAZINE  20 mg Intravenous 3 times per day  . Influenza vac split quadrivalent PF  0.5 mL Intramuscular Tomorrow-1000  . insulin aspart  0-9 Units Subcutaneous 6 times per day  . ipratropium-albuterol  3 mL Nebulization TID  . lisinopril  10 mg Oral Daily  . pantoprazole (PROTONIX) IV  40 mg Intravenous Q24H  . pneumococcal 23 valent vaccine  0.5 mL Intramuscular Tomorrow-1000  . potassium chloride  10 mEq Intravenous Q1 Hr x 4  Continuous Infusions:   PRN  Meds:.acetaminophen, albuterol, ondansetron **OR** ondansetron (ZOFRAN) IV Assessment/Plan: Principal Problem:   Acute cholecystitis Active Problems:   GERD   Hypertension   Type 2 diabetes mellitus (HCC)   Cholelithiasis and acute cholecystitis without obstruction   Acute hypoxemic respiratory failure (HCC)   Abdominal pain   Dyspnea   Chest pain   Surgery, elective   S/P cholecystectomy   Pulmonary atelectasis   Acute Cholecystitis s/p Laparoscopic CCY: Negative intraoperative cholangiogram. Given concern for post-op ileus yesterday, patient was made NPO with NGT placed and CT abdomen obtained. CT abdomen revealed 7 cm gallbladder fossa collection which could reflect hematoma, abscess, and/or biloma. Clincally, patient's abdomen is benign though bowel sounds are decreased. Patient had pulled out his NGT halfway overnight, and after consulting surgery, they do not think it needs to be replaced, as he  Has no abd pain, had bowel movement and is passing flatus.   -d/c NG tube -clear liquid diet and aspiration precaution. Hold diet if pt has AMS.   -iv protonix -zofran PRN -Unasyn for PNA -for gallbladder fossa fluid collection, likely hematoma,  And will resolve on its own.    Acute Hypoxemic Respiratory Failure: Likely 2/2 to aspiration PNA and atelectasis. Patient was extubated on 10/21/15. He is satting 95% on room air today- encouraged to sit in chair    -Out of bed to chair -Ambulate with pulse ox -Incentive spirometer -Duoneb Q6H   Anemia: Pt lost 3 g hgb post-op. His hemoglobin has been stable around 10-11 -do not think there is any further blood loss so will stop daily cbc  Hypokalemia to 3.3  -repleted accordingly    HTN: 144/88 this morning. -Hydralazine 20 mg IV Q8H, given extra 10 mg IV once this morning as bp elevated -Restart lisinopril when ng tube out , will back off on hydral once lisinopril is started   T2DM: A1c 9.5 this admission.He is on  metformin at home. -SSI-S Q4H while NPO -CBG checks q4h while NPO -Resume Metformin at discharge  GERD: Continue protonix. -Protonix IV QD  Diet: NPO  VTE ppx: Delta heparin Code status: Full  Dispo: Disposition is deferred at this time, awaiting improvement of current medical problems.  Anticipated discharge in approximately 2-3 day(s).   The patient does not have a current PCP (No primary care provider on file.) and does need an [facility] follow-up appointment after discharge.  The patient does have transportation limitations that hinder transportation to clinic appointments.   LOS: 8 days   Jill Alexanders, DO PGY-2 Internal Medicine Resident Pager # [phone] 10/25/2015 10:37 AM

## 2015-10-25 NOTE — Progress Notes (Signed)
[facility] Progress Note  7 Days Post-Op  Subjective: Much more alert and awake today.  Denies abdominal pain.  No N/V, has had 6 BM's in the last 3 days.  Urinating well.  Having good flatus.  Good BS today.  Ambulating some in halls.  Long NG hang out quite far and nearly touching the floor.  Objective: Vital signs in last 24 hours: Temp:  [99 F (37.2 C)-99.6 F (37.6 C)] 99.2 F (37.3 C) (02/12 0442) Pulse Rate:  [100-115] 107 (02/12 0742) Resp:  [18-20] 18 (02/12 0442) BP: (144-188)/(82-109) 144/88 mmHg (02/12 0742) SpO2:  [95 %-97 %] 95 % (02/12 0442) FiO2 (%):  [21 %] 21 % (02/11 1325) Weight:  [72.5 kg (159 lb 13.3 oz)] 72.5 kg (159 lb 13.3 oz) (02/12 0442) Last BM Date: 10/25/15  Intake/Output from previous day: 02/11 0701 - 02/12 0700 In: 200 [IV Piggyback:200] Out: 4403 [Urine:3450; Emesis/NG output:200] Intake/Output this shift:    PE: Gen:  Alert, NAD, pleasant Abd: Soft, less distended, not tender, great BS, no HSM, incisions C/D/I, NG almost out of patient.    Lab Results:   Recent Labs  10/23/15 0855 10/25/15 0507  WBC 12.7* 11.3*  HGB 10.6* 11.6*  HCT 31.7* 34.7*  PLT 320 486*   BMET  Recent Labs  10/24/15 1012 10/25/15 0507  NA 141 146*  K 3.1* 3.3*  CL 104 111  CO2 19* 16*  GLUCOSE 138* 166*  BUN 7 11  CREATININE 0.95 1.07  CALCIUM 8.7* 8.9   PT/INR No results for input(s): LABPROT, INR in the last 72 hours. CMP     Component Value Date/Time   NA 146* 10/25/2015 0507   K 3.3* 10/25/2015 0507   CL 111 10/25/2015 0507   CO2 16* 10/25/2015 0507   GLUCOSE 166* 10/25/2015 0507   BUN 11 10/25/2015 0507   CREATININE 1.07 10/25/2015 0507   CALCIUM 8.9 10/25/2015 0507   PROT 6.0* 10/25/2015 0507   ALBUMIN 2.3* 10/25/2015 0507   AST 69* 10/25/2015 0507   ALT 51 10/25/2015 0507   ALKPHOS 252* 10/25/2015 0507   BILITOT 1.4* 10/25/2015 0507   GFRNONAA >60 10/25/2015 0507   GFRAA >60 10/25/2015 0507   Lipase      Component Value Date/Time   LIPASE 38 10/17/2015 0335       Studies/Results: Dg Abd 1 View  10/24/2015  CLINICAL DATA:  Feeding tube placement EXAM: ABDOMEN - 1 VIEW COMPARISON:  October 19, 2015 FLUOROSCOPY TIME:  0 minutes 22 seconds; 2 submitted images FINDINGS: Feeding tube extends into the second portion of the duodenum. In the second portion of the duodenum, the tube loops upon itself with the tip directed toward the first portion of the duodenum but still remaining within the second portion of the duodenum. Contrast injection shows contrast flowing through the duodenum into the proximal jejunum. Visualized bowel gas pattern is unremarkable. IMPRESSION: Feeding tube tip in second portion of the duodenum, directed toward the first portion of the duodenum. Electronically Signed   By: Lowella Grip III M.D.   On: 10/24/2015 07:04   Ct Abdomen Pelvis W Contrast  10/24/2015  CLINICAL DATA:  Cholecystectomy, now with nausea and vomiting. EXAM: CT ABDOMEN AND PELVIS WITH CONTRAST TECHNIQUE: Multidetector CT imaging of the abdomen and pelvis was performed using the standard protocol following bolus administration of intravenous contrast. CONTRAST:  176m OMNIPAQUE IOHEXOL 300 MG/ML  SOLN COMPARISON:  10/17/2015 FINDINGS: Lower chest and abdominal wall: Unremarkable right upper  quadrant incision site. Small pleural effusions with bibasilar atelectasis. Especially in the left lower lobe, there may be superimposed airspace disease/ pneumonia. Hepatobiliary: Hepatic steatosis with perfusion anomaly or fatty sparing around the gallbladder fossa. Cholecystectomy with 7 cm fluid and gas containing collection in the gallbladder fossa. Dependently semi-solid material with clustered gas bubbles is likely hemostatic material. No duct distension. No diffuse peritoneal fluid and thickening suggestive of bile peritonitis. Pancreas: Unremarkable. Spleen: Unremarkable. Adrenals/Urinary Tract: Negative adrenals. No  hydronephrosis or stone. Unremarkable bladder with Foley catheter in place. Reproductive:No pathologic findings. Stomach/Bowel: No obstruction or ileus. Feeding tube with tip coiled in the mid duodenum, appearance stable from placement fluoroscopy. No appendicitis. Vascular/Lymphatic: No acute vascular abnormality. No mass or adenopathy. Peritoneal: Small pelvic peritoneal fluid. Musculoskeletal: No acute abnormalities. IMPRESSION: 1. 7 cm gallbladder fossa collection which could reflect hematoma, abscess, and/or biloma. 2. Bibasilar atelectasis with small effusions. Possible superimposed pneumonia or aspiration in the left lower lobe. Electronically Signed   By: Monte Fantasia M.D.   On: 10/24/2015 08:05   Dg Addison Bailey G Tube Plc W/fl-no Rad  10/23/2015  CLINICAL DATA:  NASO G TUBE PLACEMENT WITH FLUORO Fluoroscopy was utilized by the requesting physician.  No radiographic interpretation.    Anti-infectives: Anti-infectives    Start     Dose/Rate Route Frequency Ordered Stop   10/22/15 0930  Ampicillin-Sulbactam (UNASYN) 3 g in sodium chloride 0.9 % 100 mL IVPB     3 g 100 mL/hr over 60 Minutes Intravenous Every 6 hours 10/22/15 0920     10/20/15 2300  fluconazole (DIFLUCAN) IVPB 200 mg  Status:  Discontinued     200 mg 100 mL/hr over 60 Minutes Intravenous Every 24 hours 10/20/15 0012 10/22/15 0918   10/20/15 0015  vancomycin (VANCOCIN) IVPB 750 mg/150 ml premix  Status:  Discontinued     750 mg 150 mL/hr over 60 Minutes Intravenous 2 times daily 10/20/15 0012 10/22/15 0920   10/20/15 0015  fluconazole (DIFLUCAN) IVPB 400 mg     400 mg 100 mL/hr over 120 Minutes Intravenous  Once 10/20/15 0012 10/20/15 0521   10/19/15 1400  piperacillin-tazobactam (ZOSYN) IVPB 3.375 g  Status:  Discontinued     3.375 g 12.5 mL/hr over 240 Minutes Intravenous 3 times per day 10/19/15 1230 10/22/15 0920   10/17/15 1400  piperacillin-tazobactam (ZOSYN) IVPB 3.375 g  Status:  Discontinued     3.375 g 12.5 mL/hr  over 240 Minutes Intravenous 3 times per day 10/17/15 1334 10/19/15 1116   10/17/15 0600  piperacillin-tazobactam (ZOSYN) IVPB 3.375 g     3.375 g 100 mL/hr over 30 Minutes Intravenous  Once 10/17/15 0553 10/17/15 0743       Assessment/Plan Acute cholecystitis  POD #7 s/p lap chole with NEG IOC---Dr. Kieth Brightly Post op ileus -NG nearly out of patient, will remove it now that he's had multiple BM's, flatus, and good BS.  Start clears.  Don't have current concern for aspiration given how alert he is, but if he were to have AMS would hold clears. -Bili, ALT/AST, alk phos improved, doubt bile leak -GB Fossa fluid collection - likely hematoma mixed with the snow we used intra-operatively due to bleeding of the gallbladder bed.   The fluid collection will usually resolve on its own. Would be hesitant to drain it unless great concern for infection which I doubt at this time -Limit pain meds due to confusion and aspiration ABL Anemia-stable now, but lost 3 grams after surgery Acute hypoxic respiratory failure/aspiration-extubated  2/8, pulm toilets, nebs ID-Vanz/zosyn/fluconazole changed Unasyn for PNA VTE prophylaxis-SCD/heparin FEN-NPO, IVF per primary team, hypokalemia Dispo-d/c ng, slowly advance diet, monitor for aspiration risk.    LOS: 8 days    Francisco [name] 10/25/2015, 9:33 AM Pager: [phone]

## 2015-10-25 NOTE — Progress Notes (Signed)
Pharmacy Antibiotic Note  Sheldon Sem is a 70 y.o. male admitted on 10/17/2015 with cholecystitis, now s/p lap chole with resolving ileus.  Pharmacy has been consulted for Unasyn dosing for aspiration PNA.  Plan: -Unasyn 3 gm IV q6h -Monitor cx, cbc, renal function, clinical course -Follow up on LOT of antibiotics- today is D#9 total, has already had broad coverage for cholecystitis, and today is D#4 of Unasyn for aspiration.  Height:  (160 cm) Weight: 159 lb 13.3 oz (72.5 kg) IBW/kg (Calculated) : 56.9  Temp (24hrs), Avg:99.3 F (37.4 C), Min:99 F (37.2 C), Max:99.6 F (37.6 C)   Recent Labs Lab 10/20/15 0050  10/20/15 0430 10/21/15 0250 10/21/15 1116 10/22/15 0931 10/23/15 0855 10/23/15 1000 10/24/15 1012 10/25/15 0507  WBC 9.0  < >  --  11.5* 12.9* 16.1* 12.7*  --   --  11.3*  CREATININE 1.36*  < >  --  1.19  --  1.05  --  0.97 0.95 1.07  LATICACIDVEN 1.6  --  1.9  --   --   --   --   --   --   --   < > = values in this interval not displayed.  Estimated Creatinine Clearance: 57.3 mL/min (by C-G formula based on Cr of 1.07).    No Known Allergies  Antimicrobials this admission: Zosyn 2/4 >> 2/9 Vancomycin 2/4 >> 2/9 Fluconazole 2/7 >> 2/9 Unasyn 2/9 >>  Dose adjustments this admission: None  Microbiology results: 2/4 BCx: Negative 2/4 MRSA PCR: negative, SA positive 2/7 BCx: neg 2/7 Sputum: few yeast   Thank you for allowing pharmacy to be a part of this patient's care.  Tatem Fesler D. Elber Galyean, PharmD, BCPS Clinical Pharmacist Pager: [phone] 10/25/2015 11:49 AM

## 2015-10-25 NOTE — Progress Notes (Addendum)
Speech Language Pathology Treatment: Dysphagia  Patient Details Name: Francisco [name] MRN: [id number] DOB: 1946/07/30 Today's Date: 10/25/2015 Time: 1324-4010 SLP Time Calculation (min) (ACUTE ONLY): 11 min  Assessment / Plan / Recommendation Clinical Impression  Pt seen for dysphagia treatment with son at bedside who reports pt much more lucid today. Pt answering questions appropriately per son interpreting and following directions. Clear liquid diet initiated this morning. Observation with thin liquids via straw consumed without s/s aspiration and pt denied globus sensation. SLP reviewed CXR 2/6 finding of "fluid filled esophagus" briefly with son (other son may have spoken with Tacey Ruiz, SLP re: GER and CXR results). Given CXR results and report of GER with "multiple admissions", pt may benefit from barium esophagram or GI consult to further assess. Pt complains of recent nausea as well.   HPI HPI: Francisco [name] is a 70 y.o. male with PMH of HTN, chronic back pain, GERD, DM. He is s/p lap chole with neg IOC which was completed without complication (done 10/18/15). On 02/06, he had fever to 102.2. Later that night, he had change in mental status with increased somnolence, decreased responsiveness, mod respiratory distress, hypoxic resp failure. CXR 2/6 with bibasilar opacities L > R, concerning for aspiration due to his esophagus full of fluid. Intubated 2/6-2/8.       SLP Plan        Recommendations  Diet recommendations: Thin liquid (clear liquids per MD) Liquids provided via: Cup;Straw Medication Administration: Whole meds with liquid Supervision: Patient able to self feed;Intermittent supervision to cue for compensatory strategies Compensations: Slow rate;Small sips/bites Postural Changes and/or Swallow Maneuvers: Seated upright 90 degrees;Upright 30-60 min after meal             Oral Care Recommendations: Oral care BID Follow up Recommendations: None     GO                 Francisco [name] 10/25/2015, 12:08 PM  [phone]

## 2015-10-26 ENCOUNTER — Telehealth: Payer: Self-pay | Admitting: Internal Medicine

## 2015-10-26 DIAGNOSIS — K8000 Calculus of gallbladder with acute cholecystitis without obstruction: Secondary | ICD-10-CM | POA: Diagnosis not present

## 2015-10-26 LAB — GLUCOSE, CAPILLARY
GLUCOSE-CAPILLARY: 168 mg/dL — AB (ref 65–99)
Glucose-Capillary: 229 mg/dL — ABNORMAL HIGH (ref 65–99)

## 2015-10-26 MED ORDER — RANITIDINE HCL 150 MG PO TABS: ORAL_TABLET | ORAL | Status: DC

## 2015-10-26 MED ORDER — AMOXICILLIN-POT CLAVULANATE 875-125 MG PO TABS: 1.0000 | ORAL_TABLET | Freq: Two times a day (BID) | ORAL | Status: DC

## 2015-10-26 MED ORDER — LISINOPRIL 10 MG PO TABS: 10.0000 mg | ORAL_TABLET | Freq: Every day | ORAL | Status: DC

## 2015-10-26 NOTE — Progress Notes (Signed)
Patient discharge teaching given, including activity, diet, follow-up appoints, and medications. Patient verbalized understanding of all discharge instructions. IV access was d/c'd. Vitals are stable. Skin is intact except as charted in most recent assessments. Pt to be escorted out by NT, to be driven home by family. 

## 2015-10-26 NOTE — Telephone Encounter (Signed)
Have called lm for rtc

## 2015-10-26 NOTE — Progress Notes (Signed)
Internal Medicine Attending  Date: 10/26/2015  Patient name: Francisco [name] Medical record number: [id number] Date of birth: Apr 21, 1946 Age: 70 y.o. Gender: male  I saw and evaluated the patient. I reviewed the resident's note by Dr. Johnny Bridge and I agree with the resident's findings and plans as documented in his progress note.  Francisco [name] looked fantastic this morning. He was up in a chair on room air in good spirits without any complaints having eaten well. He is stable for discharge but will require follow-up in the [facility] to manage his hypertension and diabetes.

## 2015-10-26 NOTE — Discharge Summary (Signed)
Name: Francisco [name] MRN: [id number] DOB: 04-21-1946 70 y.o. PCP: No primary care provider on file.  Date of Admission: 10/17/2015  3:15 AM Date of Discharge: 10/26/2015 Attending Physician: Doneen Poisson, MD  Discharge Diagnosis: 1. Acute cholecystitis  Principal Problem:   Acute cholecystitis Active Problems:   GERD   Hypertension   Type 2 diabetes mellitus (HCC)   Cholelithiasis and acute cholecystitis without obstruction   Acute hypoxemic respiratory failure (HCC)   Abdominal pain   Dyspnea   Chest pain   Surgery, elective   S/P cholecystectomy   Pulmonary atelectasis  Discharge Medications:   Medication List    STOP taking these medications        cyclobenzaprine 10 MG tablet  Commonly known as:  FLEXERIL     HYDROcodone-homatropine 5-1.5 MG/5ML syrup  Commonly known as:  HYCODAN     naproxen 500 MG tablet  Commonly known as:  NAPROSYN      TAKE these medications        amoxicillin-clavulanate 875-125 MG tablet  Commonly known as:  AUGMENTIN  Take 1 tablet by mouth every 12 (twelve) hours. Until Wednesday evening     lisinopril 10 MG tablet  Commonly known as:  PRINIVIL,ZESTRIL  Take 1 tablet (10 mg total) by mouth daily.     metFORMIN 500 MG tablet  Commonly known as:  GLUCOPHAGE  Take 500 mg by mouth 2 (two) times daily with a meal.     ranitidine 150 MG tablet  Commonly known as:  ZANTAC  Take one tablet daily as needed for heartburn/ reflux        Disposition and follow-up:   Mr.Francisco [name] was discharged from [facility] in Good condition.  At the hospital follow up visit please address:  1.  Acute cholecystitis- post op appt on 3/1  T2DM- A1c of 9.5. On metformin 500 mg bid. Needs further management and follow up  HTN- on lisinopril, please re-assess and titrate accordingly.    Pl note that Pt needs translation in Hindi (or Nepali)  2.  Labs / imaging needed at time of follow-up:   3.  Pending labs/ test needing  follow-up:   Follow-up Appointments:     Follow-up Information    Follow up with [facility] On 11/11/2015.   Specialty:  General Surgery   Why:  For post-operation check. Your appointment is at 9:20am, please arrive at least before your appointment to complete your check in paperwork.  If you are unable to arrive prior to your appointment time we may have to cancel or reschedule you.   Contact information:   [address] [place] [address] [phone]      Follow up with Lars Masson, MD. Go on 11/06/2015.   Specialty:  Internal Medicine   Why:  1:15 PM   Contact information:   [address] [place] [address] [phone]       Discharge Instructions: Discharge Instructions    Diet - low sodium heart healthy    Complete by:  As directed      Discharge instructions    Complete by:  As directed   It was a pleasure taking care of you. Please finish the augmentin (antibiotic) until Wednesday  Please take lisinopril for your blood pressure Please take metformin for your diabetes  Please follow up in our clinic- we have made you an appointment     Increase activity slowly  Complete by:  As directed            Consultations: Treatment Team:  Md Ccs, MD  Procedures Performed:  Dg Chest 2 View  10/17/2015  CLINICAL DATA:  Acute dyspnea. EXAM: CHEST  2 VIEW COMPARISON:  August 17, 2012. FINDINGS: Stable cardiomediastinal silhouette. No pneumothorax or pleural effusion is noted. Increased bibasilar interstitial opacities are noted consistent with subsegmental atelectasis. Bony thorax is unremarkable. IMPRESSION: Increased bibasilar subsegmental atelectasis. Electronically Signed   By: Lupita Raider, M.D.   On: 10/17/2015 14:35   Dg Abd 1 View  10/24/2015  CLINICAL DATA:  Feeding tube placement EXAM: ABDOMEN - 1 VIEW COMPARISON:  October 19, 2015 FLUOROSCOPY TIME:  0 minutes 22 seconds; 2 submitted images  FINDINGS: Feeding tube extends into the second portion of the duodenum. In the second portion of the duodenum, the tube loops upon itself with the tip directed toward the first portion of the duodenum but still remaining within the second portion of the duodenum. Contrast injection shows contrast flowing through the duodenum into the proximal jejunum. Visualized bowel gas pattern is unremarkable. IMPRESSION: Feeding tube tip in second portion of the duodenum, directed toward the first portion of the duodenum. Electronically Signed   By: Bretta Bang III M.D.   On: 10/24/2015 07:04   Dg Cholangiogram Operative  10/18/2015  CLINICAL DATA:  Acute cholecystitis. EXAM: INTRAOPERATIVE CHOLANGIOGRAM TECHNIQUE: Cholangiographic images from the C-arm fluoroscopic device were submitted for interpretation post-operatively. Please see the procedural report for the amount of contrast and the fluoroscopy time utilized. COMPARISON:  CT 10/17/2015 FINDINGS: Mild dilatation of the extrahepatic biliary system. Contrast fills the bile ducts and rapidly drains into the duodenum. Minor filling of the pancreatic duct. Mild filling of the central intrahepatic ducts. No large filling defects. IMPRESSION: Patent biliary system without obstructing stones. Electronically Signed   By: Richarda Overlie M.D.   On: 10/18/2015 14:24   Ct Angio Chest Pe W/cm &/or Wo Cm  10/19/2015  CLINICAL DATA:  Acute onset of shortness of breath and confusion. Status post recent cholecystectomy. Initial encounter. EXAM: CT ANGIOGRAPHY CHEST WITH CONTRAST TECHNIQUE: Multidetector CT imaging of the chest was performed using the standard protocol during bolus administration of intravenous contrast. Multiplanar CT image reconstructions and MIPs were obtained to evaluate the vascular anatomy. CONTRAST:  75mL OMNIPAQUE IOHEXOL 350 MG/ML SOLN COMPARISON:  Chest radiograph performed earlier today at 5:32 p.m. FINDINGS: There is no evidence of pulmonary embolus.  Patchy bibasilar airspace opacity is noted, worse on the left, which may reflect postoperative atelectasis or pneumonia. A trace right pleural effusion is seen. No pneumothorax is identified. No masses are identified; no abnormal focal contrast enhancement is seen. The esophagus is largely filled with fluid, likely reflecting esophageal dysmotility. A 1.5 cm azygoesophageal recess node is seen. There is obscuration of bronchioles to the medial right lower lobe at the inferior right hilum, with vague associated soft tissue density. Though this could reflect sequelae of aspiration, a poorly characterized malignancy is a concern. No pericardial effusion is identified. The great vessels are grossly unremarkable in appearance. No axillary lymphadenopathy is seen. The visualized portions of the thyroid gland are unremarkable in appearance. There is borderline aneurysmal dilatation of the ascending thoracic aorta to 4.1 cm in maximal AP dimension. The visualized portions of the liver and spleen are unremarkable. Note is made of a collection of fluid and air at the gallbladder fossa, measuring approximately 7.1 x 3.4 x 4.2 cm, raising concern for  a bile leak. Would correlate with the patient's symptoms. No acute osseous abnormalities are seen. There is mild developmental wedging of vertebral bodies at the lower thoracic spine. Review of the MIP images confirms the above findings. IMPRESSION: 1. No evidence of pulmonary embolus. 2. Patchy bibasilar airspace opacities, worse on the left, which may reflect postoperative atelectasis or pneumonia. Given clinical concern for aspiration, this could reflect aspiration pneumonia. Trace right pleural effusion seen. 3. Collection of fluid and air at the gallbladder fossa, measuring 7.1 x 3.4 x 4.2 cm, raising concern for bile leak. Would correlate with the patient's symptoms, and consider HIDA scan for further evaluation. 4. Obscuration of bronchioles to the medial right lower lobe at  the inferior right hilum, with vague soft tissue density and an adjacent 1.5 cm azygoesophageal recess node. Though this could reflect sequelae of aspiration, a poorly characterized bronchogenic malignancy is a concern. PET/CT would be helpful for further evaluation. 5. Esophagus largely filled with fluid, likely reflecting esophageal dysmotility. 6. 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 Recommend annual imaging followup by CTA or MRA. This recommendation follows 2010 ACCF/AHA/AATS/ACR/ASA/SCA/SCAI/SIR/STS/SVM Guidelines for the Diagnosis and Management of Patients with Thoracic Aortic Disease. Circulation. 2010; 121: [id number] These results were called by telephone at the time of interpretation on 10/19/2015 at 10:32 pm to Dr. Johna Roles, who verbally acknowledged these results. Electronically Signed   By: Roanna Raider M.D.   On: 10/19/2015 22:37   Ct Abdomen Pelvis W Contrast  10/24/2015  CLINICAL DATA:  Cholecystectomy, now with nausea and vomiting. EXAM: CT ABDOMEN AND PELVIS WITH CONTRAST TECHNIQUE: Multidetector CT imaging of the abdomen and pelvis was performed using the standard protocol following bolus administration of intravenous contrast. CONTRAST:  OMNIPAQUE IOHEXOL 300 MG/ML  SOLN COMPARISON:  10/17/2015 FINDINGS: Lower chest and abdominal wall: Unremarkable right upper quadrant incision site. Small pleural effusions with bibasilar atelectasis. Especially in the left lower lobe, there may be superimposed airspace disease/ pneumonia. Hepatobiliary: Hepatic steatosis with perfusion anomaly or fatty sparing around the gallbladder fossa. Cholecystectomy with 7 cm fluid and gas containing collection in the gallbladder fossa. Dependently semi-solid material with clustered gas bubbles is likely hemostatic material. No duct distension. No diffuse peritoneal fluid and thickening suggestive of bile peritonitis. Pancreas: Unremarkable. Spleen:  Unremarkable. Adrenals/Urinary Tract: Negative adrenals. No hydronephrosis or stone. Unremarkable bladder with Foley catheter in place. Reproductive:No pathologic findings. Stomach/Bowel: No obstruction or ileus. Feeding tube with tip coiled in the mid duodenum, appearance stable from placement fluoroscopy. No appendicitis. Vascular/Lymphatic: No acute vascular abnormality. No mass or adenopathy. Peritoneal: Small pelvic peritoneal fluid. Musculoskeletal: No acute abnormalities. IMPRESSION: 1. 7 cm gallbladder fossa collection which could reflect hematoma, abscess, and/or biloma. 2. Bibasilar atelectasis with small effusions. Possible superimposed pneumonia or aspiration in the left lower lobe. Electronically Signed   By: Marnee Spring M.D.   On: 10/24/2015 08:05   Ct Abdomen Pelvis W Contrast  10/17/2015  CLINICAL DATA:  Gradually worsening epigastric pain onset 2 nights ago. Leukocytosis and evelated bilirubin. EXAM: CT ABDOMEN AND PELVIS WITH CONTRAST TECHNIQUE: Multidetector CT imaging of the abdomen and pelvis was performed using the standard protocol following bolus administration of intravenous contrast. CONTRAST:  80 cc Omnipaque 300 intravenous COMPARISON:  12/21/2008 FINDINGS: Lower chest and abdominal wall: Fluid in the right inguinal canal is likely ascitic fluid within hernia given absence in 2010 (which would suggest an encysted hydrocele ). Cardiomegaly Hepatobiliary: Cholelithiasis with hydropic gallbladder wall thickening,  pericholecystic edema, and perfusion anomaly along the gallbladder fossa. Dilated common bile duct at 11 mm without calcified choledocholithiasis. Pancreas: Unremarkable. Spleen: Unremarkable. Adrenals/Urinary Tract: Negative adrenals. No hydronephrosis or stone. Left renal cyst. Unremarkable bladder. Reproductive:No pathologic findings. Stomach/Bowel:  No obstruction. No appendicitis. Vascular/Lymphatic: No acute vascular abnormality. 13 mm nodule posterior to the left renal  artery is stable from 08/17/2012. Peritoneal: No ascites or pneumoperitoneum. Musculoskeletal: No acute abnormalities. IMPRESSION: 1. Acute cholecystitis. 2. Dilated common bile duct without visible choledocholithiasis. Electronically Signed   By: Marnee Spring M.D.   On: 10/17/2015 05:46   Dg Chest Port 1 View  10/21/2015  CLINICAL DATA:  Ventilator dependent EXAM: PORTABLE CHEST 1 VIEW COMPARISON:  10/20/2015 FINDINGS: Endotracheal tube just above the carina. This could be withdrawn 3 cm. NG tube in the stomach. Decreased lung volume compared with the prior study with increase in bibasilar atelectasis. Small left effusion. IMPRESSION: Endotracheal tube at the carina, recommend withdrawal 3 cm Progression of bibasilar atelectasis with decreased lung volume since yesterday. Electronically Signed   By: Marlan Palau M.D.   On: 10/21/2015 07:55   Dg Chest Port 1 View  10/20/2015  CLINICAL DATA:  70 year old male status post intubation. EXAM: PORTABLE CHEST 1 VIEW COMPARISON:  Chest CT dated 10/19/2015 FINDINGS: Endotracheal tube the tip approximately 3.5 cm above the carina. An enteric tube is noted with tip in the left upper abdomen. There are bibasilar airspace densities corresponding to the opacity seen on the recent CT. There is blunting the left costophrenic angle. No pneumothorax. Top-normal cardiac size. No acute osseous pathology. IMPRESSION: Endotracheal tube above the carina. Bibasilar airspace opacities. Electronically Signed   By: Elgie Collard M.D.   On: 10/20/2015 01:25   Dg Chest Port 1 View  10/19/2015  CLINICAL DATA:  Confusion. Altered mental status. Chest and abdominal pain. EXAM: PORTABLE CHEST 1 VIEW COMPARISON:  10/17/2015 FINDINGS: Very low lung volumes with bibasilar opacities, likely atelectasis. Mild cardiomegaly. Possible small effusions. No acute bony abnormality. IMPRESSION: Low lung volumes, bibasilar atelectasis and possible small effusions. Electronically Signed   By: Charlett Nose M.D.   On: 10/19/2015 17:43   Dg Abd Portable 2v  10/19/2015  CLINICAL DATA:  Confusion, altered mental status P EXAM: PORTABLE ABDOMEN - 2 VIEW COMPARISON:  08/18/2012 FINDINGS: Mild centralized gaseous distention of bowel, both large and small bowel, possibly mild ileus. Prior cholecystectomy. No free air organomegaly. IMPRESSION: Mild diffuse gaseous distention of bowel, possibly mild ileus. Prior cholecystectomy. Electronically Signed   By: Charlett Nose M.D.   On: 10/19/2015 17:44   Dg Vangie Bicker G Tube Plc W/fl-no Rad  10/23/2015  CLINICAL DATA:  NASO G TUBE PLACEMENT WITH FLUORO Fluoroscopy was utilized by the requesting physician.  No radiographic interpretation.    2D Echo:   Cardiac Cath:   Admission HPI:  This is a 70 yo man with newly diagnosed Diabetes and hypertension 3 days ago at urgent care who is here for epigastric pain since last night. Patient speaks Nepali-Hindi and his son was present at bedside who translated the information. Patient was in his usual health and ate his dinner yesterday at 5 PM and he was experiencing some abdominal pain at that time, but the pain worsened around 2 AM last night, and pt describes it as sharp non radiating 10/10 pain, and currently 2/10 after receiving pain medicines. Pt does not know what makes the pain worse, and bending forward makes it better.  Patient denies nausea, vomiting, diarrhea, constipation,  hematochezia, or melena or dysuria or hematuria. He denies fevers, chills, weight changes, changes in appetite or other constitutional symptoms. He has some shortness of breath, only with coughing. He denies orthopnea or PND. No history of heart disease. He was having some "heartburn" 2 days ago and went to [facility] and was found to have elevated CBGs, a1c unknown. They started him on metformin 500 mg daily, and also gave naproxen and flexeril.   He was admitted here in 2013 for 'high grade small bowel obstruction"  showing on CT which resolved on bowel rest and IV hydration own and the discharge diagnosis was 'gastroenteritis'  In the ER, CT abd was done which shows acute cholecystitis with dilated common bile duct. He was started on Zosyn and morphine and surgery was consulted.    PMH: new diagnosis of T2DM and HTN, arthritis, GERD  FH: Patient and son does not recall any family history of diabetes, htn, or cancer.   SH: Patient lives at home with son, and He moved from [place] 7 years ago.  He takes the tobacco for many years. No smoking. No alcohol or other illicit drug use  Hospital Course by problem list: Principal Problem:   Acute cholecystitis Active Problems:   GERD   Hypertension   Type 2 diabetes mellitus (HCC)   Cholelithiasis and acute cholecystitis without obstruction   Acute hypoxemic respiratory failure (HCC)   Abdominal pain   Dyspnea   Chest pain   Surgery, elective   S/P cholecystectomy   Pulmonary atelectasis   Acute cholecystitis with cholelithiasis with post op episode of acute hypoxemic respiratory failure requiring intubation : Pt with 1 day history of sharp non radiating 10/10 epigastric abd pain and no other symptoms who on CT was found to have cholelithiasis and gallbladder wall thickening And 11mm dilated common bile duct. He has history of pSBO in 2013 that was resolved with IV hydration and bowel rest. He underwent lap chole on 2/5. However, on POD2, pt developed altered mental status, and what looked like to be aspiration pneumonia after eating lunch. He remained hypoxic despite being on NBT for few hours, and so he was transferred to ICU for intubation. Had CT angio done which ruled out PE. He was transferred back on our service on 10/22/2014. Then he had an NG tube placed after several episodes of vomiting and as there was concern for post-op ileus so ct abd done on 2/10 which showed 7 cm gallbladder fossa collection, maybe hematoma. NG tube was removed on  2/12. He tolerated regular diet well and had no further episodes of n/v. He was placed on Unasyn on 2/9 and that was continued till the discharge day and sent home on augmentin for 3 more days. He was ambulating well on discharge day and I explained the need to follow up with surgery for post op and HTN and diabetes management.   HTN: Blood pressures have been elevated in the past per past admission notes. He is not on any meds. He was given amlodipine earlier in the ER. He was started on lisinopril, given his T2DM  GERD: The symptoms he is describing sound like GERD, but most likely it is referred pain from cholecystitis.  He was treated with protonix and sent home on ranitidine. Flexeril was stopped on discharge.    T2DM: Pt was started on metformin 500 mg qd 2 days ago at urgent care center. No previous follow ups. We held metformin and resumed this  on discharge and asked him to follow up in clinic.    Discharge Vitals:   BP 175/92 mmHg  Pulse 90  Temp(Src) 99.7 F (37.6 C) (Oral)  Resp 18  Ht 5\' 3"  (1.6 m)  Wt 159 lb 13.3 oz (72.5 kg)  BMI 28.32 kg/m2  SpO2 92%  Discharge Labs:  Results for orders placed or performed during the hospital encounter of 10/17/15 (from the past 24 hour(s))  Glucose, capillary     Status: Abnormal   Collection Time: 10/25/15 11:33 AM  Result Value Ref Range   Glucose-Capillary 200 (H) 65 - 99 mg/dL  Glucose, capillary     Status: Abnormal   Collection Time: 10/25/15  4:16 PM  Result Value Ref Range   Glucose-Capillary 248 (H) 65 - 99 mg/dL  Glucose, capillary     Status: Abnormal   Collection Time: 10/25/15  9:47 PM  Result Value Ref Range   Glucose-Capillary 204 (H) 65 - 99 mg/dL  Glucose, capillary     Status: Abnormal   Collection Time: 10/26/15  7:46 AM  Result Value Ref Range   Glucose-Capillary 168 (H) 65 - 99 mg/dL    Signed: Deneise Lever, MD 10/26/2015, 11:23 AM    Services Ordered on Discharge:  Equipment Ordered on Discharge:

## 2015-10-26 NOTE — Telephone Encounter (Signed)
Pt HFU appointment 11/06/2015 @ 1:15 with Dr.Jones. Need TOC call.

## 2015-10-26 NOTE — Progress Notes (Signed)
8 Days Post-Op  Subjective: Pt feels and looks well  Tolerating diet   Objective: Vital signs in last 24 hours: Temp:  [98.8 F (37.1 C)-99.7 F (37.6 C)] 99.7 F (37.6 C) (02/13 0531) Pulse Rate:  [92-108] 100 (02/13 0531) Resp:  [18-20] 18 (02/13 0531) BP: (155-169)/(81-87) 169/87 mmHg (02/13 0531) SpO2:  [92 %-94 %] 92 % (02/13 0531) Last BM Date: 10/25/15  Intake/Output from previous day: 02/12 0701 - 02/13 0700 In: 998 [P.O.:598; IV Piggyback:400] Out: 650 [Urine:650] Intake/Output this shift:    Incision/Wound:CDI port sites Soft NT ND   Lab Results:   Recent Labs  10/23/15 0855 10/25/15 0507  WBC 12.7* 11.3*  HGB 10.6* 11.6*  HCT 31.7* 34.7*  PLT 320 486*   BMET  Recent Labs  10/24/15 1012 10/25/15 0507  NA 141 146*  K 3.1* 3.3*  CL 104 111  CO2 19* 16*  GLUCOSE 138* 166*  BUN 7 11  CREATININE 0.95 1.07  CALCIUM 8.7* 8.9   PT/INR No results for input(s): LABPROT, INR in the last 72 hours. ABG No results for input(s): PHART, HCO3 in the last 72 hours.  Invalid input(s): PCO2, PO2  Studies/Results: No results found.  Anti-infectives: Anti-infectives    Start     Dose/Rate Route Frequency Ordered Stop   10/25/15 2215  amoxicillin-clavulanate (AUGMENTIN) 875-125 MG per tablet 1 tablet     1 tablet Oral Every 12 hours 10/25/15 2211 10/28/15 2159   10/22/15 0930  Ampicillin-Sulbactam (UNASYN) 3 g in sodium chloride 0.9 % 100 mL IVPB  Status:  Discontinued     3 g 100 mL/hr over 60 Minutes Intravenous Every 6 hours 10/22/15 0920 10/25/15 2211   10/20/15 2300  fluconazole (DIFLUCAN) IVPB 200 mg  Status:  Discontinued     200 mg 100 mL/hr over 60 Minutes Intravenous Every 24 hours 10/20/15 0012 10/22/15 0918   10/20/15 0015  vancomycin (VANCOCIN) IVPB 750 mg/150 ml premix  Status:  Discontinued     750 mg 150 mL/hr over 60 Minutes Intravenous 2 times daily 10/20/15 0012 10/22/15 0920   10/20/15 0015  fluconazole (DIFLUCAN) IVPB 400 mg     400 mg 100 mL/hr over 120 Minutes Intravenous  Once 10/20/15 0012 10/20/15 0521   10/19/15 1400  piperacillin-tazobactam (ZOSYN) IVPB 3.375 g  Status:  Discontinued     3.375 g 12.5 mL/hr over 240 Minutes Intravenous 3 times per day 10/19/15 1230 10/22/15 0920   10/17/15 1400  piperacillin-tazobactam (ZOSYN) IVPB 3.375 g  Status:  Discontinued     3.375 g 12.5 mL/hr over 240 Minutes Intravenous 3 times per day 10/17/15 1334 10/19/15 1116   10/17/15 0600  piperacillin-tazobactam (ZOSYN) IVPB 3.375 g     3.375 g 100 mL/hr over 30 Minutes Intravenous  Once 10/17/15 0553 10/17/15 0743      Assessment/Plan: s/p Procedure(s): LAPAROSCOPIC CHOLECYSTECTOMY WITH INTRAOPERATIVE CHOLANGIOGRAM (N/A) Looks well Can d/c when cleared by medicine  Follow up 2 weeks with Dr Sheliah Hatch   LOS: 9 days    Francisco [name] A. 10/26/2015

## 2015-10-26 NOTE — Discharge Instructions (Signed)
Your appointment is at 9:20am, please arrive at least before your appointment to complete your check in paperwork.  If you are unable to arrive prior to your appointment time we may have to cancel or reschedule you.  LAPAROSCOPIC SURGERY: POST OP INSTRUCTIONS  1. DIET: Follow a light bland diet the first 24 hours after arrival home, such as soup, liquids, crackers, etc. Be sure to include lots of fluids daily. Avoid fast food or heavy meals as your are more likely to get nauseated. Eat a low fat the next few days after surgery.  2. Take your usually prescribed home medications unless otherwise directed. 3. PAIN CONTROL:  1. Pain is best controlled by a usual combination of three different methods TOGETHER:  1. Ice/Heat 2. Over the counter pain medication 3. Prescription pain medication 2. Most patients will experience some swelling and bruising around the incisions. Ice packs or heating pads (30-60 minutes up to 6 times a day) will help. Use ice for the first few days to help decrease swelling and bruising, then switch to heat to help relax tight/sore spots and speed recovery. Some people prefer to use ice alone, heat alone, alternating between ice & heat. Experiment to what works for you. Swelling and bruising can take several weeks to resolve.  3. It is helpful to take an over-the-counter pain medication regularly for the first few weeks. Choose one of the following that works best for you:  1. Naproxen (Aleve, etc) Two  tabs twice a day 2. Ibuprofen (Advil, etc) Three  tabs four times a day (every meal & bedtime) 3. Acetaminophen (Tylenol, etc) 500-650mg  four times a day (every meal & bedtime) 4. A prescription for pain medication (such as oxycodone, hydrocodone, etc) should be given to you upon discharge. Take your pain medication as prescribed.  1. If you are having problems/concerns with the prescription medicine (does not control pain, nausea, vomiting, rash, itching,  etc), please call us [phone] to see if we need to switch you to a different pain medicine that will work better for you and/or control your side effect better. 2. If you need a refill on your pain medication, please contact your pharmacy. They will contact our office to request authorization. Prescriptions will not be filled after 5 pm or on week-ends. 4. Avoid getting constipated. Between the surgery and the pain medications, it is common to experience some constipation. Increasing fluid intake and taking a fiber supplement (such as Metamucil, Citrucel, FiberCon, MiraLax, etc) 1-2 times a day regularly will usually help prevent this problem from occurring. A mild laxative (prune juice, Milk of Magnesia, MiraLax, etc) should be taken according to package directions if there are no bowel movements after 48 hours.  5. Watch out for diarrhea. If you have many loose bowel movements, simplify your diet to bland foods & liquids for a few days. Stop any stool softeners and decrease your fiber supplement. Switching to mild anti-diarrheal medications (Kayopectate, Pepto Bismol) can help. If this worsens or does not improve, please call us. 6. Wash / shower every day. You may shower over the dressings as they are waterproof. Continue to shower over incision(s) after the dressing is off. 7. Remove your waterproof bandages 5 days after surgery. You may leave the incision open to air. You may replace a dressing/Band-Aid to cover the incision for comfort if you wish.  8. ACTIVITIES as tolerated:  1. You may resume regular (light) daily activities beginning the next day--such as daily self-care,  walking, climbing stairs--gradually increasing activities as tolerated. If you can walk 30 minutes without difficulty, it is safe to try more intense activity such as jogging, treadmill, bicycling, low-impact aerobics, swimming, etc. 2. Save the most intensive and strenuous activity for last such as sit-ups, heavy lifting,  contact sports, etc Refrain from any heavy lifting or straining until you are off narcotics for pain control.  3. DO NOT PUSH THROUGH PAIN. Let pain be your guide: If it hurts to do something, don't do it. Pain is your body warning you to avoid that activity for another week until the pain goes down. 4. You may drive when you are no longer taking prescription pain medication, you can comfortably wear a seatbelt, and you can safely maneuver your car and apply brakes. 5. You may have sexual intercourse when it is comfortable.  9. FOLLOW UP in our office  1. Please call [facility] at [phone] to set up an appointment to see your surgeon in the office for a follow-up appointment approximately 2-3 weeks after your surgery. 2. Make sure that you call for this appointment the day you arrive home to insure a convenient appointment time.      10. IF YOU HAVE DISABILITY OR FAMILY LEAVE FORMS, BRING THEM TO THE               OFFICE FOR PROCESSING.   WHEN TO CALL us [phone]:  1. Poor pain control 2. Reactions / problems with new medications (rash/itching, nausea, etc)  3. Fever over 101.5 F (38.5 C) 4. Inability to urinate 5. Nausea and/or vomiting 6. Worsening swelling or bruising 7. Continued bleeding from incision. 8. Increased pain, redness, or drainage from the incision  The clinic staff is available to answer your questions during regular business hours (8:30am-5pm). Please dont hesitate to call and ask to speak to one of our nurses for clinical concerns.  If you have a medical emergency, go to the nearest emergency room or call 911.  A surgeon from [facility] is always on call at the [facility], [place]  [address], [address], [place], [place] [address] ?  MAIN: [phone] ? TOLL FREE: [phone] ?  FAX [phone]  [email or web address]  It was a pleasure taking care of you. Please finish the augmentin (antibiotic) until  Wednesday  Please take lisinopril for your blood pressure Please take metformin for your diabetes  Please follow up in our clinic- we have made you an appointment Please follow up with the surgeon on March 1st

## 2015-10-26 NOTE — Progress Notes (Signed)
Subjective:  Patient did not have any complaints.  He denies abd pain, n/v/d. He is having bowel movements and passing flatus. He said he was able to eat the clear liquid diet well. They had also brought food from home which he was able to tolerate well.  I explained the need for follow up in our clinic for hypertension and diabetes, and also follow up with surgery on 3/1 They had no further questions.  Objective: Vital signs in last 24 hours: Filed Vitals:   10/25/15 2149 10/26/15 0531 10/26/15 0847 10/26/15 0856  BP: 155/81 169/87 175/92   Pulse: 108 100 90   Temp: 98.8 F (37.1 C) 99.7 F (37.6 C)    TempSrc: Oral Oral    Resp: 18 18    Height:      Weight:    159 lb 13.3 oz (72.5 kg)  SpO2: 94% 92%     Weight change:   Intake/Output Summary (Last 24 hours) at 10/26/15 1044 Last data filed at 10/25/15 1837  Gross per 24 hour  Intake    998 ml  Output    650 ml  Net    348 ml   General: Vital signs reviewed.  Patient sitting in chair  Cardiovascular: RRR, S1 normal, S2 normal. Pulmonary/Chest:  Ctab, no wheezing or crackles Abdominal: Soft, non-tender, non-distended, normal bowel sounds   Extremities: No lower extremity edema bilaterally, pulses symmetric and intact   Lab Results: Basic Metabolic Panel:  Recent Labs Lab 10/21/15 0250 10/22/15 0931  10/24/15 1012 10/25/15 0507  NA 141 144  < > 141 146*  K 3.3* 3.8  < > 3.1* 3.3*  CL 110 111  < > 104 111  CO2 23 22  < > 19* 16*  GLUCOSE 117* 119*  < > 138* 166*  BUN 15 14  < > 7 11  CREATININE 1.19 1.05  < > 0.95 1.07  CALCIUM 7.2* 8.2*  < > 8.7* 8.9  MG 1.9 2.2  --   --   --   PHOS 1.5* 2.3*  --   --   --   < > = values in this interval not displayed. Liver Function Tests:  Recent Labs Lab 10/24/15 1012 10/25/15 0507  AST 114* 69*  ALT 63 51  ALKPHOS 331* 252*  BILITOT 1.7* 1.4*  PROT 6.5 6.0*  ALBUMIN 2.3* 2.3*   CBC:  Recent Labs Lab 10/20/15 0222  10/23/15 0855 10/25/15 0507  WBC  12.0*  < > 12.7* 11.3*  NEUTROABS 9.5*  --   --   --   HGB 12.3*  < > 10.6* 11.6*  HCT 38.7*  < > 31.7* 34.7*  MCV 86.8  < > 85.0 82.0  PLT 225  < > 320 486*  < > = values in this interval not displayed. Cardiac Enzymes:  Recent Labs Lab 10/19/15 1649 10/20/15 0050  TROPONINI 0.04* 0.06*   CBG:  Recent Labs Lab 10/25/15 0438 10/25/15 0741 10/25/15 1133 10/25/15 1616 10/25/15 2147 10/26/15 0746  GLUCAP 144* 173* 200* 248* 204* 168*   Hemoglobin A1C: No results for input(s): HGBA1C in the last 168 hours. Coagulation:  Recent Labs Lab 10/20/15 0222  LABPROT 16.6*  INR 1.33   Urinalysis:  Recent Labs Lab 10/20/15 0120  COLORURINE AMBER*  LABSPEC 1.024  PHURINE 5.0  GLUCOSEU 100*  HGBUR SMALL*  BILIRUBINUR SMALL*  KETONESUR 15*  PROTEINUR 30*  NITRITE NEGATIVE  LEUKOCYTESUR NEGATIVE    Micro Results: Recent Results (  from the past 240 hour(s))  Culture, blood (routine x 2)     Status: None   Collection Time: 10/17/15  1:55 PM  Result Value Ref Range Status   Specimen Description BLOOD RESISTANT HAND  Final   Special Requests BOTTLES DRAWN AEROBIC ONLY  8CC  Final   Culture NO GROWTH 5 DAYS  Final   Report Status 10/22/2015 FINAL  Final  Culture, blood (routine x 2)     Status: None   Collection Time: 10/17/15  1:56 PM  Result Value Ref Range Status   Specimen Description BLOOD RIGHT ANTECUBITAL  Final   Special Requests BOTTLES DRAWN AEROBIC AND ANAEROBIC  10CC  Final   Culture NO GROWTH 5 DAYS  Final   Report Status 10/22/2015 FINAL  Final  Surgical pcr screen     Status: Abnormal   Collection Time: 10/17/15  8:55 PM  Result Value Ref Range Status   MRSA, PCR NEGATIVE NEGATIVE Final   Staphylococcus aureus POSITIVE (A) NEGATIVE Final    Comment:        The Xpert SA Assay (FDA approved for NASAL specimens in patients over 72 years of age), is one component of a comprehensive surveillance program.  Test performance has been validated by  [facility] for patients greater than or equal to 14 year old. It is not intended to diagnose infection nor to guide or monitor treatment.   MRSA PCR Screening     Status: None   Collection Time: 10/20/15  1:20 AM  Result Value Ref Range Status   MRSA by PCR NEGATIVE NEGATIVE Final    Comment:        The GeneXpert MRSA Assay (FDA approved for NASAL specimens only), is one component of a comprehensive MRSA colonization surveillance program. It is not intended to diagnose MRSA infection nor to guide or monitor treatment for MRSA infections.   Culture, blood (x 2)     Status: None   Collection Time: 10/20/15  2:11 AM  Result Value Ref Range Status   Specimen Description BLOOD RIGHT ANTECUBITAL  Final   Special Requests BOTTLES DRAWN AEROBIC AND ANAEROBIC 5CC  Final   Culture NO GROWTH 5 DAYS  Final   Report Status 10/25/2015 FINAL  Final  Culture, blood (x 2)     Status: None   Collection Time: 10/20/15  2:22 AM  Result Value Ref Range Status   Specimen Description BLOOD RIGHT ANTECUBITAL  Final   Special Requests BOTTLES DRAWN AEROBIC AND ANAEROBIC 5CC  Final   Culture NO GROWTH 5 DAYS  Final   Report Status 10/25/2015 FINAL  Final  Culture, respiratory (NON-Expectorated)     Status: None   Collection Time: 10/20/15  3:25 AM  Result Value Ref Range Status   Specimen Description TRACHEAL ASPIRATE  Final   Special Requests NONE  Final   Gram Stain   Final    FEW WBC PRESENT,BOTH PMN AND MONONUCLEAR NO SQUAMOUS EPITHELIAL CELLS SEEN FEW YEAST Performed at [facility]    Culture   Final    FEW YEAST CONSISTENT WITH CANDIDA SPECIES Performed at [facility]    Report Status 10/22/2015 FINAL  Final   Studies/Results: No results found. Medications:  I have reviewed the patient's current medications. Prior to Admission:  Prescriptions prior to admission  Medication Sig Dispense Refill Last Dose  . cyclobenzaprine (FLEXERIL) 10 MG tablet Take 10 mg by  mouth 3 (three) times daily as needed for muscle spasms.  Past Month at Unknown time  . HYDROcodone-homatropine (HYCODAN) 5-1.5 MG/5ML syrup Take 5 mLs by mouth every 4 (four) hours as needed for cough.   unk  . metFORMIN (GLUCOPHAGE) 500 MG tablet Take 500 mg by mouth 2 (two) times daily with a meal.   Past Week at Unknown time  . naproxen (NAPROSYN) 500 MG tablet Take 500 mg by mouth 2 (two) times daily as needed for mild pain.   Past Month at Unknown time   Scheduled Meds: . amoxicillin-clavulanate  1 tablet Oral Q12H  . antiseptic oral rinse  7 mL Mouth Rinse BID  . heparin subcutaneous  5,000 Units Subcutaneous 3 times per day  . Influenza vac split quadrivalent PF  0.5 mL Intramuscular Tomorrow-1000  . insulin aspart  0-5 Units Subcutaneous QHS  . insulin aspart  0-9 Units Subcutaneous TID WC  . lisinopril  10 mg Oral Daily  . pantoprazole (PROTONIX) IV  40 mg Intravenous Q24H   Continuous Infusions:   PRN Meds:.acetaminophen, albuterol, hydrALAZINE, ipratropium-albuterol, ondansetron **OR** ondansetron (ZOFRAN) IV, pneumococcal 13-valent conjugate vaccine Assessment/Plan: Principal Problem:   Acute cholecystitis Active Problems:   GERD   Hypertension   Type 2 diabetes mellitus (HCC)   Cholelithiasis and acute cholecystitis without obstruction   Acute hypoxemic respiratory failure (HCC)   Abdominal pain   Dyspnea   Chest pain   Surgery, elective   S/P cholecystectomy   Pulmonary atelectasis   Acute Cholecystitis s/p Laparoscopic CCY: patient's NG tube was out yesterday. His diet was advanced to clear liquids yesterday which he tolerated well. They also brought home food which he was able to tolerate. He is satting well on room air and denies abd pain, n/v/ and has good bowel sounds. His unasyn was switched to PO augmentin by the night team, so he will be discharged home with 3 more days of oral augmentin. Follow up has been scheduled in our clinic, and with surgery  clinic.  -d/c home   HTN: 175/92 this morning.  Lisinopril has been resumed- Hydralazine has been stopped  -going home on lisinopril   T2DM: A1c 9.5 this admission.He is on metformin at home. cbgs have been stable  -resume metformin on discharge    GERD:   -discontinue protonix, sent home on ranitidine PRN   Diet: regular  VTE ppx: [place] heparin Code status: Full  Dispo: Disposition is deferred at this time, awaiting improvement of current medical problems.  Anticipated discharge in approximately 2-3 day(s).   The patient does not have a current PCP (No primary care provider on file.) and does need an [facility] hospital follow-up appointment after discharge.  The patient does have transportation limitations that hinder transportation to clinic appointments.   LOS: 9 days   Jill Alexanders, DO PGY-2 Internal Medicine Resident Pager # [phone] 10/26/2015 10:44 AM

## 2015-10-26 NOTE — Care Management Important Message (Signed)
Important Message  Patient Details  Name: Francisco [name] MRN: [id number] Date of Birth: 1945-12-19   Medicare Important Message Given:  Yes    Meghan Tiemann P Shelia Kingsberry 10/26/2015, 2:16 PM

## 2015-10-28 NOTE — Telephone Encounter (Signed)
LVM for TOC call  

## 2015-10-29 NOTE — Telephone Encounter (Signed)
No answer again, will close out

## 2015-11-06 ENCOUNTER — Ambulatory Visit (INDEPENDENT_AMBULATORY_CARE_PROVIDER_SITE_OTHER): Payer: Medicare Other | Admitting: Internal Medicine

## 2015-11-06 ENCOUNTER — Encounter: Payer: Self-pay | Admitting: Internal Medicine

## 2015-11-06 VITALS — BP 143/93 | HR 93 | Temp 98.0°F | Ht 63.0 in | Wt 146.7 lb

## 2015-11-06 DIAGNOSIS — E1165 Type 2 diabetes mellitus with hyperglycemia: Secondary | ICD-10-CM

## 2015-11-06 DIAGNOSIS — Z9049 Acquired absence of other specified parts of digestive tract: Secondary | ICD-10-CM

## 2015-11-06 DIAGNOSIS — E118 Type 2 diabetes mellitus with unspecified complications: Secondary | ICD-10-CM

## 2015-11-06 DIAGNOSIS — I10 Essential (primary) hypertension: Secondary | ICD-10-CM

## 2015-11-06 LAB — GLUCOSE, CAPILLARY: GLUCOSE-CAPILLARY: 156 mg/dL — AB (ref 65–99)

## 2015-11-06 MED ORDER — LISINOPRIL 10 MG PO TABS: 10.0000 mg | ORAL_TABLET | Freq: Every day | ORAL | Status: AC

## 2015-11-06 NOTE — Patient Instructions (Signed)
1. Please follow up with your primary care doctor in 4-6 weeks.   2. Please take all medications as previously prescribed with the following changes:  Continue taking Lisinopril 10 mg daily for your blood pressure.   Continue taking Metformin 500 mg twice daily for your diabetes.   Please follow up with the surgeon on 11/11/15.   3. If you have worsening of your symptoms or new symptoms arise, please call the clinic ([phone]), or go to the ER immediately if symptoms are severe.

## 2015-11-06 NOTE — Progress Notes (Signed)
   Subjective:   Patient ID: Francisco [name] male   DOB: [birth year] 70 y.o.   MRN: [id number]  HPI: Francisco [name] is a Nepali speaking 70 y.o. male w/ PMHx of HTN, GERD, DM type II, and chronic pain pain, presents to the clinic today for a hospital follow-up visit for acute cholecystitis. Patient had lap chole on 11/11/15. Since that time He has been doing very well. Today he is accompanied by his son. He denies any significant abdominal pain, his surgical sites are healing nicely. He has a good appetite, normal bowel movements, no fever, chills, nausea, or vomiting.   Per the son, he was recently diagnosed with DM type II and was only started on Metformin a short time ago (~1 month). It is rather unclear if he actually has a PCP but the son says he does.   He also states he ran out of his blood pressure medicine and has not taken this for a week or so. BP elevated today.   Past Medical History  Diagnosis Date  . Hypertension   . Back pain   . DJD (degenerative joint disease)   . Scoliosis   . Bursitis   . GERD (gastroesophageal reflux disease)   . SBO (small bowel obstruction) (HCC) 08/17/2012  . Type 2 diabetes mellitus (HCC) 10/17/2015   Current Outpatient Prescriptions  Medication Sig Dispense Refill  . lisinopril (PRINIVIL,ZESTRIL) 10 MG tablet Take 1 tablet (10 mg total) by mouth daily. 30 tablet 5  . metFORMIN (GLUCOPHAGE) 500 MG tablet Take 500 mg by mouth 2 (two) times daily with a meal.    . ranitidine (ZANTAC) 150 MG tablet Take one tablet daily as needed for heartburn/ reflux 30 tablet 0   No current facility-administered medications for this visit.    Review of Systems: General: Denies fever, chills, diaphoresis, appetite change and fatigue.  Respiratory: Denies SOB, DOE, cough, and wheezing.   Cardiovascular: Denies chest pain and palpitations.  Gastrointestinal: Denies nausea, vomiting, abdominal pain, and diarrhea.  Genitourinary: Denies dysuria, increased frequency, and  flank pain. Endocrine: Denies hot or cold intolerance, polyuria, and polydipsia. Musculoskeletal: Denies myalgias, back pain, joint swelling, arthralgias and gait problem.  Skin: Denies pallor, rash and wounds.  Neurological: Denies dizziness, seizures, syncope, weakness, lightheadedness, numbness and headaches.  Psychiatric/Behavioral: Denies mood changes, and sleep disturbances.  Objective:   Physical Exam: Filed Vitals:   11/06/15 1340  BP: 143/93  Pulse: 93  Temp: 98 F (36.7 C)  TempSrc: Oral  Height:  (1.6 m)  Weight: 146 lb 11.2 oz (66.543 kg)  SpO2: 98%    General: Rather healthy appearing, elderly male, alert, cooperative, NAD. HEENT: PERRL, EOMI. Moist mucus membranes. Teeth discoloration Neck: Full range of motion without pain, supple, no lymphadenopathy or carotid bruits Lungs: Clear to ascultation bilaterally, normal work of respiration, no wheezes, rales, rhonchi Heart: RRR, no murmurs, gallops, or rubs Abdomen: Soft, non-tender, non-distended, BS +. Healing laparoscopic scars.  Extremities: No cyanosis, clubbing, or edema Neurologic: Alert & oriented X3, cranial nerves II-XII intact, strength grossly intact, sensation intact to light touch   Assessment & Plan:   Please see problem based assessment and plan.

## 2015-11-07 LAB — CMP14 + ANION GAP
ALT: 38 IU/L (ref 0–44)
ANION GAP: 18 mmol/L (ref 10.0–18.0)
AST: 33 IU/L (ref 0–40)
Albumin/Globulin Ratio: 1.3 (ref 1.1–2.5)
Albumin: 3.9 g/dL (ref 3.5–4.8)
Alkaline Phosphatase: 205 IU/L — ABNORMAL HIGH (ref 39–117)
BUN/Creatinine Ratio: 16 (ref 10–22)
BUN: 12 mg/dL (ref 8–27)
Bilirubin Total: 0.4 mg/dL (ref 0.0–1.2)
CO2: 23 mmol/L (ref 18–29)
CREATININE: 0.75 mg/dL — AB (ref 0.76–1.27)
Calcium: 9.9 mg/dL (ref 8.6–10.2)
Chloride: 98 mmol/L (ref 96–106)
GFR, EST AFRICAN AMERICAN: 107 mL/min/{1.73_m2} (ref >59)
GFR, EST NON AFRICAN AMERICAN: 93 mL/min/{1.73_m2} (ref >59)
GLOBULIN, TOTAL: 3.1 g/dL (ref 1.5–4.5)
Glucose: 136 mg/dL — ABNORMAL HIGH (ref 65–99)
Potassium: 4.6 mmol/L (ref 3.5–5.2)
Sodium: 139 mmol/L (ref 134–144)
TOTAL PROTEIN: 7 g/dL (ref 6.0–8.5)

## 2015-11-09 NOTE — Assessment & Plan Note (Signed)
BP Readings from Last 3 Encounters:  11/06/15 143/93  10/26/15 175/92  05/29/13 178/99    Lab Results  Component Value Date   NA 139 11/06/2015   K 4.6 11/06/2015   CREATININE 0.75* 11/06/2015    Assessment: Blood pressure control:  Mild elevation Comments: Was taking Lisinopril 10 mg daily but recently ran out per son  Plan: Medications:  Refill Lisinopril 10 mg daily Other plans: Cr stable. Discussed with son, if he needs to establish with a new PCP (very unclear if he actually has a primary doctor, says he does, but meds started in urgent care), he should come back for a follow up in 4-6 weeks, otherwise follow up with PCP.

## 2015-11-09 NOTE — Progress Notes (Signed)
Internal Medicine Clinic Attending  Case discussed with Dr. Jones at the time of the visit.  We reviewed the resident's history and exam and pertinent patient test results.  I agree with the assessment, diagnosis, and plan of care documented in the resident's note.  

## 2015-11-09 NOTE — Assessment & Plan Note (Signed)
Doing well, good appetite, wounds healing nicely. No nausea, vomiting, or diarrhea. Scheduled for follow up with surgery on 11/11/15.

## 2015-11-09 NOTE — Assessment & Plan Note (Signed)
Lab Results  Component Value Date   HGBA1C 9.5* 10/17/2015     Assessment: Diabetes control:  Elevated Comments: Patient has been on Metformin for 1-2 months per the son, takes 500 mg bid  Plan: Medications:  continue current medications Instruction/counseling given: reminded to get eye exam, reminded to bring blood glucose meter & log to each visit, reminded to bring medications to each visit, discussed foot care and discussed diet Other plans: Follow up in 6 weeks with PCP. Cr stable.

## 2016-06-29 ENCOUNTER — Ambulatory Visit: Payer: Self-pay | Admitting: Rheumatology

## 2016-08-08 ENCOUNTER — Ambulatory Visit: Payer: Self-pay | Admitting: Rheumatology

## 2016-09-22 ENCOUNTER — Encounter (INDEPENDENT_AMBULATORY_CARE_PROVIDER_SITE_OTHER): Payer: Self-pay | Admitting: Orthopaedic Surgery

## 2016-09-22 ENCOUNTER — Ambulatory Visit (INDEPENDENT_AMBULATORY_CARE_PROVIDER_SITE_OTHER): Payer: Medicare Other | Admitting: Orthopaedic Surgery

## 2016-09-22 ENCOUNTER — Ambulatory Visit (INDEPENDENT_AMBULATORY_CARE_PROVIDER_SITE_OTHER): Payer: Medicare Other

## 2016-09-22 VITALS — Ht 64.0 in | Wt 155.0 lb

## 2016-09-22 DIAGNOSIS — M1712 Unilateral primary osteoarthritis, left knee: Secondary | ICD-10-CM

## 2016-09-22 DIAGNOSIS — G8929 Other chronic pain: Secondary | ICD-10-CM

## 2016-09-22 DIAGNOSIS — M25562 Pain in left knee: Secondary | ICD-10-CM

## 2016-09-22 MED ORDER — LIDOCAINE HCL 1 % IJ SOLN
5.0000 mL | INTRAMUSCULAR | Status: AC | PRN
  Administered 2016-09-22: 5 mL

## 2016-09-22 MED ORDER — BUPIVACAINE HCL 0.5 % IJ SOLN
3.0000 mL | INTRAMUSCULAR | Status: AC | PRN
  Administered 2016-09-22: 3 mL via INTRA_ARTICULAR

## 2016-09-22 MED ORDER — METHYLPREDNISOLONE ACETATE 40 MG/ML IJ SUSP
80.0000 mg | INTRAMUSCULAR | Status: AC | PRN
  Administered 2016-09-22: 80 mg

## 2016-09-22 NOTE — Progress Notes (Signed)
Office Visit Note   Patient: Francisco [name]           Date of Birth: 06/23/1946           MRN: [id number] Visit Date: 09/22/2016              Requested by: No referring provider defined for this encounter. PCP: No primary care provider on file.   Assessment & Plan: Visit Diagnoses:  1. Chronic pain of left knee   2. Primary osteoarthritis of left knee     Plan:  #1: Corticosteroid injection to the left knee accomplished atraumatically #2: Explained through an interpreter to watch his glucoses even though he does not check it on multiple occasions. #3: Follow-up if his symptoms do not improve  Follow-Up Instructions: Return in about 4 weeks (around 10/20/2016).   Orders:  Orders Placed This Encounter  Procedures  . Large Joint Injection/Arthrocentesis  . XR KNEE 3 VIEW LEFT   No orders of the defined types were placed in this encounter.     Procedures: Large Joint Inj Date/Time: 09/22/2016 11:03 AM Performed by: Jacqualine CodePETRARCA, BRIAN D Authorized by: Jacqualine CodePETRARCA, BRIAN D   Consent Given by:  Patient Timeout: prior to procedure the correct patient, procedure, and site was verified   Indications:  Pain and joint swelling Location:  Knee Site:  L knee Prep: patient was prepped and draped in usual sterile fashion   Needle Size:  25 G Needle Length:  1.5 inches Approach:  Anteromedial Ultrasound Guidance: No   Fluoroscopic Guidance: No   Arthrogram: No   Medications:  5 mL lidocaine 1 %; 80 mg methylPREDNISolone acetate 40 MG/ML; 3 mL bupivacaine 0.5 % Aspiration Attempted: No   Patient tolerance:  Patient tolerated the procedure well with no immediate complications     Clinical Data: No additional findings.   Subjective: Chief Complaint  Patient presents with  . Left Knee - Pain    Pt having left knee pain for 3-4 months, no injury.  Ambulates with a cane    Review of Systems  Constitutional:       History of diabetes on metformin.  HENT: Negative.     Respiratory: Negative.   Cardiovascular: Negative.   Gastrointestinal: Negative.   Genitourinary: Negative.   Skin: Negative.   Neurological: Negative.   Hematological: Negative.   Psychiatric/Behavioral: Negative.      Objective: Vital Signs: Ht 5\' 4"  (1.626 m)   Wt 155 lb (70.3 kg)   BMI 26.61 kg/m   Physical Exam  Constitutional: He is oriented to person, place, and time. He appears well-developed and well-nourished.  HENT:  Head: Normocephalic and atraumatic.  Eyes: EOM are normal. Pupils are equal, round, and reactive to light.  Pulmonary/Chest: Effort normal.  Neurological: He is alert and oriented to person, place, and time.  Skin: Skin is warm and dry.  Psychiatric: He has a normal mood and affect. His behavior is normal. Judgment and thought content normal.    Left Knee Exam   Range of Motion  Extension: 0  Flexion: 120   Tests  Lachman:  Anterior - negative     Drawer:       Anterior - negative       Other  Sensation: normal      Specialty Comments:  No specialty comments available.  Imaging: Xr Knee 3 View Left  Result Date: 09/22/2016 Three-view x-ray reveals degenerative changes in the patellofemoral area with some lateral positioning of the patella narrowing of  the lateral patellofemoral joint. Periarticular spurs are noted on the sunrise view. Also on the AP there is narrowing certainly of the medial compartment. Para-articular spurs little bit more prominent on the lateral compartments maintains good joint space at that area.    PMFS History: Patient Active Problem List   Diagnosis Date Noted  . S/P cholecystectomy   . Pulmonary atelectasis   . Chest pain   . Surgery, elective   . Acute hypoxemic respiratory failure (HCC) 10/19/2015  . Abdominal pain   . Dyspnea   . Acute cholecystitis 10/17/2015  . Hypertension 10/17/2015  . Type 2 diabetes mellitus (HCC) 10/17/2015  . Cholelithiasis and acute cholecystitis without obstruction  10/17/2015  . SHOULDER PAIN, LEFT 10/07/2010  . ELEVATED BLOOD PRESSURE WITHOUT DIAGNOSIS OF HYPERTENSION 10/07/2010  . BACK PAIN 07/26/2010  . TOBACCO ABUSE 10/01/2008  . GERD 05/08/2008  . HAND INJURY 05/08/2008  . 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 BURSITIS, RIGHT SHOULDER 10/26/2007  . SCOLIOSIS, THORACOLUMBAR 10/26/2007   Past Medical History:  Diagnosis Date  . Back pain   . Bursitis   . DJD (degenerative joint disease)   . GERD (gastroesophageal reflux disease)   . Hypertension   . SBO (small bowel obstruction) 08/17/2012  . Scoliosis   . Type 2 diabetes mellitus (HCC) 10/17/2015    No family history on file.  Past Surgical History:  Procedure Laterality Date  . CHOLECYSTECTOMY N/A 10/18/2015   Procedure: LAPAROSCOPIC CHOLECYSTECTOMY WITH INTRAOPERATIVE CHOLANGIOGRAM;  Surgeon: Rodman Pickle, MD;  Location: [facility] OR;  Service: General;  Laterality: N/A;  . NO PAST SURGERIES     Social History   Occupational History  . Not on file.   Social History Main Topics  . Smoking status: Never Smoker  . Smokeless tobacco: Current User    Types: Chew  . Alcohol use No  . Drug use: No  . Sexual activity: Not on file

## 2016-09-23 ENCOUNTER — Ambulatory Visit (INDEPENDENT_AMBULATORY_CARE_PROVIDER_SITE_OTHER): Payer: Self-pay | Admitting: Orthopaedic Surgery

## 2016-10-16 ENCOUNTER — Emergency Department (HOSPITAL_COMMUNITY)
Admission: EM | Admit: 2016-10-16 | Discharge: 2016-10-16 | Disposition: A | Discharge status: Home / Self Care | Payer: Medicare Other | Attending: Emergency Medicine | Admitting: Emergency Medicine

## 2016-10-16 ENCOUNTER — Emergency Department (HOSPITAL_COMMUNITY): Payer: Medicare Other

## 2016-10-16 ENCOUNTER — Encounter (HOSPITAL_COMMUNITY): Payer: Self-pay

## 2016-10-16 DIAGNOSIS — Z7984 Long term (current) use of oral hypoglycemic drugs: Secondary | ICD-10-CM | POA: Diagnosis not present

## 2016-10-16 DIAGNOSIS — E860 Dehydration: Secondary | ICD-10-CM | POA: Diagnosis not present

## 2016-10-16 DIAGNOSIS — R1110 Vomiting, unspecified: Secondary | ICD-10-CM | POA: Diagnosis present

## 2016-10-16 DIAGNOSIS — R109 Unspecified abdominal pain: Secondary | ICD-10-CM | POA: Insufficient documentation

## 2016-10-16 DIAGNOSIS — K529 Noninfective gastroenteritis and colitis, unspecified: Secondary | ICD-10-CM | POA: Diagnosis not present

## 2016-10-16 DIAGNOSIS — E119 Type 2 diabetes mellitus without complications: Secondary | ICD-10-CM | POA: Insufficient documentation

## 2016-10-16 DIAGNOSIS — R112 Nausea with vomiting, unspecified: Secondary | ICD-10-CM | POA: Insufficient documentation

## 2016-10-16 DIAGNOSIS — I10 Essential (primary) hypertension: Secondary | ICD-10-CM | POA: Diagnosis not present

## 2016-10-16 LAB — LIPASE, BLOOD: LIPASE: 39 U/L (ref 11–51)

## 2016-10-16 LAB — URINALYSIS, ROUTINE W REFLEX MICROSCOPIC
Bacteria, UA: NONE SEEN
Bilirubin Urine: NEGATIVE
Cellular Cast, UA: 90
GLUCOSE, UA: 50 mg/dL — AB
HGB URINE DIPSTICK: NEGATIVE
KETONES UR: NEGATIVE mg/dL
LEUKOCYTES UA: NEGATIVE
Nitrite: NEGATIVE
PH: 5 (ref 5.0–8.0)
PROTEIN: 30 mg/dL — AB
SQUAMOUS EPITHELIAL / LPF: NONE SEEN
Specific Gravity, Urine: 1.015 (ref 1.005–1.030)

## 2016-10-16 LAB — CBC
HCT: 46 % (ref 39.0–52.0)
HEMOGLOBIN: 15.6 g/dL (ref 13.0–17.0)
MCH: 28.8 pg (ref 26.0–34.0)
MCHC: 33.9 g/dL (ref 30.0–36.0)
MCV: 84.9 fL (ref 78.0–100.0)
Platelets: 266 10*3/uL (ref 150–400)
RBC: 5.42 MIL/uL (ref 4.22–5.81)
RDW: 13.7 % (ref 11.5–15.5)
WBC: 15.6 10*3/uL — AB (ref 4.0–10.5)

## 2016-10-16 LAB — COMPREHENSIVE METABOLIC PANEL
ALT: 23 U/L (ref 17–63)
ANION GAP: 15 (ref 5–15)
AST: 34 U/L (ref 15–41)
Albumin: 4.5 g/dL (ref 3.5–5.0)
Alkaline Phosphatase: 89 U/L (ref 38–126)
BUN: 10 mg/dL (ref 6–20)
CHLORIDE: 96 mmol/L — AB (ref 101–111)
CO2: 25 mmol/L (ref 22–32)
Calcium: 10.1 mg/dL (ref 8.9–10.3)
Creatinine, Ser: 1.02 mg/dL (ref 0.61–1.24)
GFR calc Af Amer: >60 mL/min (ref >60)
GFR calc non Af Amer: >60 mL/min (ref >60)
Glucose, Bld: 190 mg/dL — ABNORMAL HIGH (ref 65–99)
POTASSIUM: 3.4 mmol/L — AB (ref 3.5–5.1)
SODIUM: 136 mmol/L (ref 135–145)
Total Bilirubin: 1.1 mg/dL (ref 0.3–1.2)
Total Protein: 8.1 g/dL (ref 6.5–8.1)

## 2016-10-16 MED ORDER — ONDANSETRON 4 MG PO TBDP: 4.0000 mg | ORAL_TABLET | Freq: Three times a day (TID) | ORAL | 0 refills | Status: DC | PRN

## 2016-10-16 MED ORDER — SODIUM CHLORIDE 0.9 % IV BOLUS (SEPSIS)
1000.0000 mL | Freq: Once | INTRAVENOUS | Status: AC
  Administered 2016-10-16: 1000 mL via INTRAVENOUS

## 2016-10-16 MED ORDER — ONDANSETRON 4 MG PO TBDP
ORAL_TABLET | ORAL | Status: AC
  Filled 2016-10-16: qty 1

## 2016-10-16 MED ORDER — ONDANSETRON 4 MG PO TBDP
4.0000 mg | ORAL_TABLET | Freq: Once | ORAL | Status: AC | PRN
  Administered 2016-10-16: 4 mg via ORAL

## 2016-10-16 MED ORDER — IOPAMIDOL (ISOVUE-300) INJECTION 61%
INTRAVENOUS | Status: AC
  Administered 2016-10-16: 100 mL
  Filled 2016-10-16: qty 100

## 2016-10-16 NOTE — ED Provider Notes (Signed)
[facility]-EMERGENCY DEPT Provider Note   CSN: [id number] Arrival date & time: 10/16/16  0612     History   Chief Complaint Chief Complaint  Patient presents with  . Abdominal Pain  . Emesis    HPI Francisco [name] is a 71 y.o. male.  Pt presents to the ED this morning with abdominal pain and vomiting.  Pt is a [place] speaking man whose son gives the translation.  We tried the translation line, but the pt can't understand the interpreter.  Pt requests that the we not use it.  The pt said that he was given zofran at triage which has relieved his nausea.  Pain has much improved.      Past Medical History:  Diagnosis Date  . Back pain   . Bursitis   . DJD (degenerative joint disease)   . GERD (gastroesophageal reflux disease)   . Hypertension   . SBO (small bowel obstruction) 08/17/2012  . Scoliosis   . Type 2 diabetes mellitus (HCC) 10/17/2015    Patient Active Problem List   Diagnosis Date Noted  . S/P cholecystectomy   . Pulmonary atelectasis   . Chest pain   . Surgery, elective   . Acute hypoxemic respiratory failure (HCC) 10/19/2015  . Abdominal pain   . Dyspnea   . Acute cholecystitis 10/17/2015  . Hypertension 10/17/2015  . Type 2 diabetes mellitus (HCC) 10/17/2015  . Cholelithiasis and acute cholecystitis without obstruction 10/17/2015  . SHOULDER PAIN, LEFT 10/07/2010  . ELEVATED BLOOD PRESSURE WITHOUT DIAGNOSIS OF HYPERTENSION 10/07/2010  . BACK PAIN 07/26/2010  . TOBACCO ABUSE 10/01/2008  . GERD 05/08/2008  . HAND INJURY 05/08/2008  . 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 BURSITIS, RIGHT SHOULDER 10/26/2007  . SCOLIOSIS, THORACOLUMBAR 10/26/2007    Past Surgical History:  Procedure Laterality Date  . CHOLECYSTECTOMY N/A 10/18/2015   Procedure: LAPAROSCOPIC CHOLECYSTECTOMY WITH INTRAOPERATIVE CHOLANGIOGRAM;  Surgeon: Rodman PickleLuke Aaron Kinsinger, MD;  Location: [facility] OR;  Service: General;  Laterality: N/A;  . NO PAST SURGERIES         Home  Medications    Prior to Admission medications   Medication Sig Start Date End Date Taking? Authorizing Provider  lisinopril (PRINIVIL,ZESTRIL) 10 MG tablet Take 1 tablet (10 mg total) by mouth daily. 11/06/15   Courtney ParisEden W Jones, MD  metFORMIN (GLUCOPHAGE) 500 MG tablet Take 500 mg by mouth 2 (two) times daily with a meal.    Historical Provider, MD  ondansetron (ZOFRAN ODT) 4 MG disintegrating tablet Take 1 tablet (4 mg total) by mouth every 8 (eight) hours as needed. 10/16/16   Jacalyn LefevreJulie Tyshana Nishida, MD  ranitidine (ZANTAC) 150 MG tablet Take one tablet daily as needed for heartburn/ reflux 10/26/15   Deneise LeverParth Saraiya, MD    Family History No family history on file.  Social History Social History  Substance Use Topics  . Smoking status: Never Smoker  . Smokeless tobacco: Current User    Types: Chew  . Alcohol use No     Allergies   Patient has no known allergies.   Review of Systems Review of Systems  Gastrointestinal: Positive for abdominal pain, nausea and vomiting.  All other systems reviewed and are negative.    Physical Exam Updated Vital Signs BP 118/84 (BP Location: Right Arm)   Pulse 104   Temp 98.7 F (37.1 C) (Oral)   Resp 18   SpO2 96%   Physical Exam  Constitutional: He is oriented to person, place, and time. He appears well-developed and  well-nourished.  HENT:  Head: Normocephalic and atraumatic.  Right Ear: External ear normal.  Left Ear: External ear normal.  Nose: Nose normal.  Mouth/Throat: Mucous membranes are dry.  Eyes: Conjunctivae and EOM are normal. Pupils are equal, round, and reactive to light.  Neck: Normal range of motion. Neck supple.  Cardiovascular: Normal rate, regular rhythm, normal heart sounds and intact distal pulses.   Pulmonary/Chest: Effort normal and breath sounds normal.  Abdominal: Soft. Bowel sounds are normal.  Musculoskeletal: Normal range of motion.  Neurological: He is alert and oriented to person, place, and time.  Skin: Skin is  warm and dry.  Psychiatric: He has a normal mood and affect. His behavior is normal. Judgment and thought content normal.  Nursing note and vitals reviewed.    ED Treatments / Results  Labs (all labs ordered are listed, but only abnormal results are displayed) Labs Reviewed  COMPREHENSIVE METABOLIC PANEL - Abnormal; Notable for the following:       Result Value   Potassium 3.4 (*)    Chloride 96 (*)    Glucose, Bld 190 (*)    All other components within normal limits  CBC - Abnormal; Notable for the following:    WBC 15.6 (*)    All other components within normal limits  URINALYSIS, ROUTINE W REFLEX MICROSCOPIC - Abnormal; Notable for the following:    APPearance HAZY (*)    Glucose, UA 50 (*)    Protein, ur 30 (*)    All other components within normal limits  LIPASE, BLOOD    EKG  EKG Interpretation None       Radiology Ct Abdomen Pelvis W Contrast  Result Date: 10/16/2016 CLINICAL DATA:  Abdominal pain with vomiting. EXAM: CT ABDOMEN AND PELVIS WITH CONTRAST TECHNIQUE: Multidetector CT imaging of the abdomen and pelvis was performed using the standard protocol following bolus administration of intravenous contrast. CONTRAST:  ISOVUE-300 IOPAMIDOL (ISOVUE-300) INJECTION 61% COMPARISON:  CT abdomen dated 10/24/2015. FINDINGS: Lower chest: Mild bibasilar atelectasis/scarring. Hepatobiliary: Status post cholecystectomy with commensurate bile duct dilatation. Liver appears normal. The complex fluid collection that was seen in the gallbladder fossa region on the earlier study has resolved. Pancreas: Unremarkable. No pancreatic ductal dilatation or surrounding inflammatory changes. Spleen: Normal in size without focal abnormality. Adrenals/Urinary Tract: Adrenal glands appear normal. Left renal cysts. No renal stone or hydronephrosis bilaterally. No perinephric inflammation. No ureteral or bladder calculi identified. Bladder is decompressed. Stomach/Bowel: Bowel is normal in  caliber. Mild thickening and enhancement of the walls of the small bowel within the central abdomen and lower abdomen suggesting enteritis. No associated fluid or inflammation within the surrounding mesentery. Vascular/Lymphatic: No significant vascular findings are present. Benign-appearing cyst within the periaortic retroperitoneum, at the level of the left renal artery takeoff. No enlarged abdominal or pelvic lymph nodes. Reproductive: Unremarkable. Other: No free fluid or abscess collection. No free intraperitoneal air. Musculoskeletal: Mild degenerative change throughout the thoracolumbar spine. No acute or suspicious osseous finding. IMPRESSION: 1. Mild thickening and enhancement of the walls of the small bowel within the central abdomen and lower abdomen suggesting small bowel enteritis of infectious or inflammatory nature. No bowel obstruction. 2. No additional acute/significant findings. No free fluid or abscess collection. No free intraperitoneal air. No evidence of acute solid organ abnormality. No renal or ureteral calculi. Appendix is normal. Electronically Signed   By: Bary Richard M.D.   On: 10/16/2016 11:53    Procedures Procedures (including critical care time)  Medications Ordered in  ED Medications  ondansetron (ZOFRAN-ODT) 4 MG disintegrating tablet (not administered)  ondansetron (ZOFRAN-ODT) disintegrating tablet 4 mg (4 mg Oral Given 10/16/16 1610)  sodium chloride 0.9 % bolus 1,000 mL (1,000 mLs Intravenous New Bag/Given 10/16/16 0950)  iopamidol (ISOVUE-300) 61 % injection (100 mLs  Contrast Given 10/16/16 1106)     Initial Impression / Assessment and Plan / ED Course  I have reviewed the triage vital signs and the nursing notes.  Pertinent labs & imaging results that were available during my care of the patient were reviewed by me and considered in my medical decision making (see chart for details).    Pt is feeling much better.  He is able to tolerate po fluids.  He knows to  return if worse.  He has been seen in the [facility] in the past, so I told him to f/u with them again.    Final Clinical Impressions(s) / ED Diagnoses   Final diagnoses:  Enteritis  Dehydration  Non-intractable vomiting with nausea, unspecified vomiting type    New Prescriptions New Prescriptions   ONDANSETRON (ZOFRAN ODT) 4 MG DISINTEGRATING TABLET    Take 1 tablet (4 mg total) by mouth every 8 (eight) hours as needed.     Jacalyn Lefevre, MD 10/16/16 [phone]

## 2016-10-16 NOTE — ED Triage Notes (Signed)
Per pt family pt started having abdominal pain and vomiting this morning with diarrhea; pt is whimpering in pain at triage; pt does not speak [place] but family giving hx and report at triage;

## 2016-10-21 ENCOUNTER — Ambulatory Visit (INDEPENDENT_AMBULATORY_CARE_PROVIDER_SITE_OTHER): Payer: Medicare Other | Admitting: Orthopaedic Surgery

## 2016-10-21 ENCOUNTER — Encounter (INDEPENDENT_AMBULATORY_CARE_PROVIDER_SITE_OTHER): Payer: Self-pay | Admitting: Orthopaedic Surgery

## 2016-10-21 VITALS — BP 156/102 | HR 83 | Ht 64.0 in | Wt 155.0 lb

## 2016-10-21 DIAGNOSIS — M25562 Pain in left knee: Secondary | ICD-10-CM

## 2016-10-21 DIAGNOSIS — G8929 Other chronic pain: Secondary | ICD-10-CM | POA: Diagnosis not present

## 2016-10-21 NOTE — Progress Notes (Signed)
Office Visit Note   Patient: Francisco [name]           Date of Birth: [birth year]           MRN: [id number] Visit Date: 10/21/2016              Requested by: No referring provider defined for this encounter. PCP: No PCP Per Patient   Assessment & Plan: Visit Diagnoses: Osteoarthritis left knee with excellent response to cortisone.   Plan: We'll plan to see back in a when necessary basis. Long discussion regarding use of anti-inflammatory medicines, heat and even a pullover knee support if necessary.  Follow-Up Instructions: No Follow-up on file.   Orders:  No orders of the defined types were placed in this encounter.  No orders of the defined types were placed in this encounter.     Procedures: No procedures performed   Clinical Data: No additional findings.   Subjective: Chief Complaint  Patient presents with  . Left Knee - Follow-up    Patient is a 71 y.o male who presents today for follow up of left knee. He is status post cortisone injection left knee on 09/22/16, which provided excellent relief. He does complain of some pain above knee cap on occasion but overall is feeling better.  Patient is accompanied by an interpreter. Patient is from [place] and speaks no English  Review of Systems   Objective: Vital Signs: BP (!) 156/102 (BP Location: Left Arm, Patient Position: Sitting, Cuff Size: Normal)   Pulse 83   Ht 5\' 4"  (1.626 m)   Wt 155 lb (70.3 kg)   BMI 26.61 kg/m   Physical Exam  Ortho Exam left knee exam with very minimal effusion. Knee is not hot warm and only minimal effusion. Some patellar crepitation. Mild pain along the lateral patella facet consistent with the previously diagnosed patellofemoral arthritis. Full quick extension and flexion over 105 without instability. No calf pain or swelling. Neurovascular exam intact.  Specialty Comments:  No specialty comments available.  Imaging: No results found.   PMFS History: Patient Active Problem  List   Diagnosis Date Noted  . S/P cholecystectomy   . Pulmonary atelectasis   . Chest pain   . Surgery, elective   . Acute hypoxemic respiratory failure (HCC) 10/19/2015  . Abdominal pain   . Dyspnea   . Acute cholecystitis 10/17/2015  . Hypertension 10/17/2015  . Type 2 diabetes mellitus (HCC) 10/17/2015  . Cholelithiasis and acute cholecystitis without obstruction 10/17/2015  . SHOULDER PAIN, LEFT 10/07/2010  . ELEVATED BLOOD PRESSURE WITHOUT DIAGNOSIS OF HYPERTENSION 10/07/2010  . BACK PAIN 07/26/2010  . TOBACCO ABUSE 10/01/2008  . GERD 05/08/2008  . HAND INJURY 05/08/2008  . 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 BURSITIS, RIGHT SHOULDER 10/26/2007  . SCOLIOSIS, THORACOLUMBAR 10/26/2007   Past Medical History:  Diagnosis Date  . Back pain   . Bursitis   . DJD (degenerative joint disease)   . GERD (gastroesophageal reflux disease)   . Hypertension   . SBO (small bowel obstruction) 08/17/2012  . Scoliosis   . Type 2 diabetes mellitus (HCC) 10/17/2015    No family history on file.  Past Surgical History:  Procedure Laterality Date  . CHOLECYSTECTOMY N/A 10/18/2015   Procedure: LAPAROSCOPIC CHOLECYSTECTOMY WITH INTRAOPERATIVE CHOLANGIOGRAM;  Surgeon: Rodman Pickle, MD;  Location: [facility] OR;  Service: General;  Laterality: N/A;  . NO PAST SURGERIES     Social History   Occupational History  . Not on  file.   Social History Main Topics  . Smoking status: Never Smoker  . Smokeless tobacco: Current User    Types: Chew  . Alcohol use No  . Drug use: No  . Sexual activity: Not on file

## 2016-11-23 ENCOUNTER — Encounter (INDEPENDENT_AMBULATORY_CARE_PROVIDER_SITE_OTHER): Payer: Self-pay | Admitting: Orthopedic Surgery

## 2016-11-23 ENCOUNTER — Ambulatory Visit (INDEPENDENT_AMBULATORY_CARE_PROVIDER_SITE_OTHER): Payer: Medicare Other | Admitting: Orthopedic Surgery

## 2016-11-23 ENCOUNTER — Ambulatory Visit (INDEPENDENT_AMBULATORY_CARE_PROVIDER_SITE_OTHER): Payer: Medicare Other

## 2016-11-23 VITALS — BP 116/75 | HR 87 | Resp 14 | Ht 64.0 in | Wt 154.0 lb

## 2016-11-23 DIAGNOSIS — M25562 Pain in left knee: Secondary | ICD-10-CM

## 2016-11-23 DIAGNOSIS — M1711 Unilateral primary osteoarthritis, right knee: Secondary | ICD-10-CM

## 2016-11-23 DIAGNOSIS — M25561 Pain in right knee: Secondary | ICD-10-CM

## 2016-11-23 DIAGNOSIS — M1712 Unilateral primary osteoarthritis, left knee: Secondary | ICD-10-CM | POA: Diagnosis not present

## 2016-11-23 DIAGNOSIS — G8929 Other chronic pain: Secondary | ICD-10-CM | POA: Diagnosis not present

## 2016-11-23 MED ORDER — METHYLPREDNISOLONE ACETATE 40 MG/ML IJ SUSP
80.0000 mg | INTRAMUSCULAR | Status: AC | PRN
  Administered 2016-11-23: 80 mg

## 2016-11-23 MED ORDER — BUPIVACAINE HCL 0.5 % IJ SOLN
3.0000 mL | INTRAMUSCULAR | Status: AC | PRN
  Administered 2016-11-23: 3 mL via INTRA_ARTICULAR

## 2016-11-23 MED ORDER — LIDOCAINE HCL 1 % IJ SOLN
3.0000 mL | INTRAMUSCULAR | Status: AC | PRN
  Administered 2016-11-23: 3 mL

## 2016-11-23 MED ORDER — LIDOCAINE HCL 1 % IJ SOLN
5.0000 mL | INTRAMUSCULAR | Status: AC | PRN
  Administered 2016-11-23: 5 mL

## 2016-11-23 NOTE — Progress Notes (Signed)
Office Visit Note   Patient: Francisco [name]           Date of Birth: 05/28/1946           MRN: [id number] Visit Date: 11/23/2016              Requested by: No referring provider defined for this encounter. PCP: No PCP Per Patient   Assessment & Plan: Visit Diagnoses:  1. Primary osteoarthritis of right knee   2. Chronic pain of right knee   3. Chronic pain of left knee   4. Primary osteoarthritis of left knee     Plan:  #1: Corticosteroid injection to the right knee #2: Aspiration of the left knee to rule out crystalline cause for his pain and effusion #3: May consider Visco supplementation #4: If the aspirate of the left knee is benign and possibly an MRI scan would be indicated to see if there is any type of lesion that is amenable to arthroscopic surgery   Follow-Up Instructions: Return in about 2 weeks (around 12/07/2016).   Orders:  Orders Placed This Encounter  Procedures  . Large Joint Injection/Arthrocentesis  . Large Joint Injection/Arthrocentesis  . Body Fluid Culture  . XR Knee Complete 4 Views Right  . Synovial cell count + diff, w/ crystals   No orders of the defined types were placed in this encounter.     Procedures: Large Joint Inj Date/Time: 11/23/2016 10:50 AM Performed by: Jacqualine CodePETRARCA, Connar Keating D Authorized by: Jacqualine CodePETRARCA, Lacrecia Delval D   Consent Given by:  Patient Timeout: prior to procedure the correct patient, procedure, and site was verified   Indications:  Pain and joint swelling Location:  Knee Site:  L knee Prep: patient was prepped and draped in usual sterile fashion   Needle Size:  18 G Needle Length:  1.5 inches Approach:  Anteromedial Ultrasound Guidance: No   Fluoroscopic Guidance: No   Arthrogram: No   Medications:  3 mL lidocaine 1 % Aspiration Attempted: Yes   Aspirate amount (mL):  35 Aspirate:  Yellow Patient tolerance:  Patient tolerated the procedure well with no immediate complications Large Joint Inj Date/Time: 11/23/2016 10:50  AM Performed by: Jacqualine CodePETRARCA, Hobie Kohles D Authorized by: Jacqualine CodePETRARCA, Rita Vialpando D   Consent Given by:  Patient Timeout: prior to procedure the correct patient, procedure, and site was verified   Indications:  Pain and joint swelling Location:  Knee Site:  R knee Prep: patient was prepped and draped in usual sterile fashion   Needle Size:  25 G Needle Length:  1.5 inches Approach:  Anteromedial Ultrasound Guidance: No   Fluoroscopic Guidance: No   Arthrogram: No   Medications:  5 mL lidocaine 1 %; 80 mg methylPREDNISolone acetate 40 MG/ML; 3 mL bupivacaine 0.5 % Aspiration Attempted: No   Patient tolerance:  Patient tolerated the procedure well with no immediate complications     Clinical Data: No additional findings.   Subjective: Chief Complaint  Patient presents with  . Right Knee - Pain  . Left Knee - Pain    Mr.Francisco [name] is a 71 year old male who was seen today for evaluation of both knees. At his last visit he did have an injection of corticosteroid into the left knee which apparently was beneficial for short time and then he started having more swelling and pain. He returns today requesting another cortisone injection.  In regards to his right knees about when week of pain comfort without an effusion. He is having difficulty with sleeping and bending his knee.  His blood particularly having that much pain with walking. He is a diabetic. They will tolerate the cortisone injection to the left knee without difficulty. Is interested in having cortisone injection.       Review of Systems  Constitutional:       History of diabetes on metformin.  HENT: Negative.   Respiratory: Negative.   Cardiovascular: Negative.   Gastrointestinal: Negative.   Genitourinary: Negative.   Skin: Negative.   Neurological: Negative.   Hematological: Negative.   Psychiatric/Behavioral: Negative.      Objective: Vital Signs: BP 116/75 (BP Location: Left Arm, Patient Position: Sitting, Cuff Size:  Normal)   Pulse 87   Resp 14   Ht 5\' 4"  (1.626 m)   Wt 154 lb (69.9 kg)   BMI 26.43 kg/m   Physical Exam  Constitutional: He is oriented to person, place, and time. He appears well-developed and well-nourished.  HENT:  Head: Normocephalic and atraumatic.  Eyes: EOM are normal. Pupils are equal, round, and reactive to light.  Pulmonary/Chest: Effort normal.  Musculoskeletal:       Right knee: He exhibits no effusion.       Left knee: He exhibits effusion (2+ effusion).  Neurological: He is alert and oriented to person, place, and time.  Skin: Skin is warm and dry.  Psychiatric: He has a normal mood and affect. His behavior is normal. Judgment and thought content normal.    Right Knee Exam   Range of Motion  Extension: 0  Flexion: 110   Tests  Lachman:  Anterior - negative     Drawer:       Anterior - negative      Other  Sensation: normal Other tests: no effusion present   Left Knee Exam   Range of Motion  Extension: 0  Flexion: 120   Tests  Lachman:  Anterior - negative     Drawer:       Anterior - negative       Other  Sensation: normal Effusion: effusion (2+ effusion) present      Specialty Comments:  No specialty comments available.  Imaging: No results found.   PMFS History: Patient Active Problem List   Diagnosis Date Noted  . S/P cholecystectomy   . Pulmonary atelectasis   . Chest pain   . Surgery, elective   . Acute hypoxemic respiratory failure (HCC) 10/19/2015  . Abdominal pain   . Dyspnea   . Acute cholecystitis 10/17/2015  . Hypertension 10/17/2015  . Type 2 diabetes mellitus (HCC) 10/17/2015  . Cholelithiasis and acute cholecystitis without obstruction 10/17/2015  . SHOULDER PAIN, LEFT 10/07/2010  . ELEVATED BLOOD PRESSURE WITHOUT DIAGNOSIS OF HYPERTENSION 10/07/2010  . BACK PAIN 07/26/2010  . TOBACCO ABUSE 10/01/2008  . GERD 05/08/2008  . HAND INJURY 05/08/2008  . 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  BURSITIS, RIGHT SHOULDER 10/26/2007  . SCOLIOSIS, THORACOLUMBAR 10/26/2007   Past Medical History:  Diagnosis Date  . Back pain   . Bursitis   . DJD (degenerative joint disease)   . GERD (gastroesophageal reflux disease)   . Hypertension   . SBO (small bowel obstruction) 08/17/2012  . Scoliosis   . Type 2 diabetes mellitus (HCC) 10/17/2015    History reviewed. No pertinent family history.  Past Surgical History:  Procedure Laterality Date  . CHOLECYSTECTOMY N/A 10/18/2015   Procedure: LAPAROSCOPIC CHOLECYSTECTOMY WITH INTRAOPERATIVE CHOLANGIOGRAM;  Surgeon: Rodman Pickle, MD;  Location: [facility] OR;  Service: General;  Laterality: N/A;  .  NO PAST SURGERIES     Social History   Occupational History  . Not on file.   Social History Main Topics  . Smoking status: Never Smoker  . Smokeless tobacco: Current User    Types: Chew  . Alcohol use No  . Drug use: No  . Sexual activity: Not on file

## 2016-11-24 LAB — SYNOVIAL CELL COUNT + DIFF, W/ CRYSTALS
BASOPHILS, %: 0 %
EOSINOPHILS-SYNOVIAL: 0 % (ref 0–2)
Lymphocytes-Synovial Fld: 45 % (ref 0–74)
MONOCYTE/MACROPHAGE: 51 % (ref 0–69)
Neutrophil, Synovial: 2 % (ref 0–24)
SYNOVIOCYTES, %: 2 % (ref 0–15)
WBC, Synovial: 325 cells/uL — ABNORMAL HIGH (ref <150)

## 2016-11-28 LAB — BODY FLUID CULTURE
Gram Stain: NONE SEEN
Organism ID, Bacteria: NO GROWTH

## 2016-12-05 ENCOUNTER — Ambulatory Visit (INDEPENDENT_AMBULATORY_CARE_PROVIDER_SITE_OTHER): Payer: Medicare Other | Admitting: Orthopaedic Surgery

## 2016-12-05 ENCOUNTER — Encounter (INDEPENDENT_AMBULATORY_CARE_PROVIDER_SITE_OTHER): Payer: Self-pay | Admitting: Orthopaedic Surgery

## 2016-12-05 VITALS — BP 148/100 | HR 84 | Resp 14 | Ht 66.0 in | Wt 154.0 lb

## 2016-12-05 DIAGNOSIS — M25561 Pain in right knee: Secondary | ICD-10-CM | POA: Diagnosis not present

## 2016-12-05 DIAGNOSIS — G8929 Other chronic pain: Secondary | ICD-10-CM | POA: Diagnosis not present

## 2016-12-05 DIAGNOSIS — M25562 Pain in left knee: Secondary | ICD-10-CM

## 2016-12-05 NOTE — Progress Notes (Signed)
Office Visit Note   Patient: Francisco [name]           Date of Birth: 08/07/1946           MRN: [id number] Visit Date: 12/05/2016              Requested by: No referring provider defined for this encounter. PCP: No PCP Per Patient   Assessment & Plan: Visit Diagnoses: Bilateral knee osteoarthritis with  excellent response to recent cortisone injections  Plan: Long discussion regarding treatment options over time including NSAIDs and follow-up cortisone injections. We might even consider Visco supplementation. We'll see back at on an as-needed basis. Family member acted as Equities trader today  Follow-Up Instructions: No Follow-up on file.   Orders:  No orders of the defined types were placed in this encounter.  No orders of the defined types were placed in this encounter.     Procedures: No procedures performed   Clinical Data: No additional findings.   Subjective: No chief complaint on file.   Mr.Carie is a 71 year old male who was seen today for evaluation of both knees. At his last visit he did have an injection of corticosteroid into the left and right knee which gave him relief from pain. Through his daughter, he says there is more ROM in left knee and the right one is also doing much better. He can bend his left knee with minimal pain. Pt is a diabetic    Review of Systems   Objective: Vital Signs: There were no vitals taken for this visit.  Physical Exam  Ortho Exam patient walks without a limp. Excellent range of motion in both knees with full extension and flexion over 105. No increased varus or valgus with testing. Small effusion left knee. None on right. +1 pulses distally no swelling. Skin intact. Positive crepitation beneath the patella with flexion-extension left knee. Not on right.    Imaging: No results found.   PMFS History: Patient Active Problem List   Diagnosis Date Noted  . S/P cholecystectomy   . Pulmonary atelectasis   . Chest pain     . Surgery, elective   . Acute hypoxemic respiratory failure (HCC) 10/19/2015  . Abdominal pain   . Dyspnea   . Acute cholecystitis 10/17/2015  . Hypertension 10/17/2015  . Type 2 diabetes mellitus (HCC) 10/17/2015  . Cholelithiasis and acute cholecystitis without obstruction 10/17/2015  . SHOULDER PAIN, LEFT 10/07/2010  . ELEVATED BLOOD PRESSURE WITHOUT DIAGNOSIS OF HYPERTENSION 10/07/2010  . BACK PAIN 07/26/2010  . TOBACCO ABUSE 10/01/2008  . GERD 05/08/2008  . HAND INJURY 05/08/2008  . 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 BURSITIS, RIGHT SHOULDER 10/26/2007  . SCOLIOSIS, THORACOLUMBAR 10/26/2007   Past Medical History:  Diagnosis Date  . Back pain   . Bursitis   . DJD (degenerative joint disease)   . GERD (gastroesophageal reflux disease)   . Hypertension   . SBO (small bowel obstruction) 08/17/2012  . Scoliosis   . Type 2 diabetes mellitus (HCC) 10/17/2015    No family history on file.  Past Surgical History:  Procedure Laterality Date  . CHOLECYSTECTOMY N/A 10/18/2015   Procedure: LAPAROSCOPIC CHOLECYSTECTOMY WITH INTRAOPERATIVE CHOLANGIOGRAM;  Surgeon: Rodman Pickle, MD;  Location: [facility] OR;  Service: General;  Laterality: N/A;  . NO PAST SURGERIES     Social History   Occupational History  . Not on file.   Social History Main Topics  . Smoking status: Never Smoker  . Smokeless tobacco: Current  User    Types: Chew  . Alcohol use No  . Drug use: No  . Sexual activity: Not on file

## 2019-04-05 ENCOUNTER — Other Ambulatory Visit: Payer: Self-pay

## 2019-04-05 DIAGNOSIS — Z20822 Contact with and (suspected) exposure to covid-19: Secondary | ICD-10-CM

## 2019-04-09 LAB — NOVEL CORONAVIRUS, NAA: SARS-CoV-2, NAA: NOT DETECTED

## 2020-01-03 ENCOUNTER — Emergency Department (HOSPITAL_COMMUNITY)
Admission: EM | Admit: 2020-01-03 | Discharge: 2020-01-04 | Disposition: A | Discharge status: Home / Self Care | Payer: Medicare Other | Attending: Emergency Medicine | Admitting: Emergency Medicine

## 2020-01-03 ENCOUNTER — Other Ambulatory Visit: Payer: Self-pay

## 2020-01-03 ENCOUNTER — Encounter (HOSPITAL_COMMUNITY): Payer: Self-pay | Admitting: Emergency Medicine

## 2020-01-03 DIAGNOSIS — I10 Essential (primary) hypertension: Secondary | ICD-10-CM | POA: Insufficient documentation

## 2020-01-03 DIAGNOSIS — E119 Type 2 diabetes mellitus without complications: Secondary | ICD-10-CM | POA: Diagnosis not present

## 2020-01-03 DIAGNOSIS — R0989 Other specified symptoms and signs involving the circulatory and respiratory systems: Secondary | ICD-10-CM

## 2020-01-03 DIAGNOSIS — R05 Cough: Secondary | ICD-10-CM | POA: Diagnosis not present

## 2020-01-03 DIAGNOSIS — Z7984 Long term (current) use of oral hypoglycemic drugs: Secondary | ICD-10-CM | POA: Diagnosis not present

## 2020-01-03 NOTE — ED Triage Notes (Signed)
Pt states it feels like he something stuck in throat x 3 weeks. Mild cough, denies shortness of breath, vomiting, difficulty swallowing or drooling.

## 2020-01-04 ENCOUNTER — Emergency Department (HOSPITAL_COMMUNITY): Payer: Medicare Other

## 2020-01-04 LAB — CBC WITH DIFFERENTIAL/PLATELET
Abs Immature Granulocytes: 0.02 10*3/uL (ref 0.00–0.07)
Basophils Absolute: 0 10*3/uL (ref 0.0–0.1)
Basophils Relative: 1 %
Eosinophils Absolute: 0.3 10*3/uL (ref 0.0–0.5)
Eosinophils Relative: 4 %
HCT: 44.2 % (ref 39.0–52.0)
Hemoglobin: 14.1 g/dL (ref 13.0–17.0)
Immature Granulocytes: 0 %
Lymphocytes Relative: 35 %
Lymphs Abs: 2.7 10*3/uL (ref 0.7–4.0)
MCH: 27.8 pg (ref 26.0–34.0)
MCHC: 31.9 g/dL (ref 30.0–36.0)
MCV: 87.2 fL (ref 80.0–100.0)
Monocytes Absolute: 0.6 10*3/uL (ref 0.1–1.0)
Monocytes Relative: 7 %
Neutro Abs: 4.2 10*3/uL (ref 1.7–7.7)
Neutrophils Relative %: 53 %
Platelets: 281 10*3/uL (ref 150–400)
RBC: 5.07 MIL/uL (ref 4.22–5.81)
RDW: 14 % (ref 11.5–15.5)
WBC: 7.8 10*3/uL (ref 4.0–10.5)
nRBC: 0 % (ref 0.0–0.2)

## 2020-01-04 LAB — I-STAT CHEM 8, ED
BUN: 19 mg/dL (ref 8–23)
Calcium, Ion: 1.27 mmol/L (ref 1.15–1.40)
Chloride: 103 mmol/L (ref 98–111)
Creatinine, Ser: 0.9 mg/dL (ref 0.61–1.24)
Glucose, Bld: 104 mg/dL — ABNORMAL HIGH (ref 70–99)
HCT: 46 % (ref 39.0–52.0)
Hemoglobin: 15.6 g/dL (ref 13.0–17.0)
Potassium: 4.4 mmol/L (ref 3.5–5.1)
Sodium: 140 mmol/L (ref 135–145)
TCO2: 26 mmol/L (ref 22–32)

## 2020-01-04 MED ORDER — IOHEXOL 300 MG/ML  SOLN
75.0000 mL | Freq: Once | INTRAMUSCULAR | Status: AC | PRN
  Administered 2020-01-04: 07:00:00 75 mL via INTRAVENOUS

## 2020-01-04 NOTE — ED Notes (Signed)
PAGED DR Gwyneth Revels TO RN PHOEBE--Stuart Guillen

## 2020-01-04 NOTE — ED Provider Notes (Signed)
Clinical Course as of Jan 04 1723  Sat Jan 04, 2020  0705 Pt signed out to me by Dr Blinda Leatherwood.  Briefly 74 yo male who is Nepali speaking with son at bedside helping translate who reports sensation of something stuck in his throat.  Pending CT soft tissue.  Patient stable here, no evidence of acute airway compromise per overnight EDP   [MT]  1018 IMPRESSION: 1. Negative neck soft tissues. No neck mass or abnormality to explain globus sensation or voice changes. ENT follow-up may be valuable. 2. Pronounced chronic paranasal sinus disease, similar to a 2014 MRI.     [MT]  1035 Reviewed workup with patient and his son, which was reassuring.  Adivsed they f/u with their PCP regarding ENT follow up, unfortunately it appears the ENT offices won't accept his medicaide.  While I do not think his medical condition is emergent, he may need a laryngoscope if he has persistent symptoms in the future   [MT]    Clinical Course User Index [MT] Malaky Tetrault, Kermit Balo, MD      Terald Sleeper, MD 01/04/20 [phone]

## 2020-01-04 NOTE — ED Notes (Signed)
Patient verbalizes understanding of discharge instructions. Opportunity for questioning and answers were provided. Armband removed by staff, pt discharged from ED.  

## 2020-01-04 NOTE — ED Provider Notes (Signed)
[facility] EMERGENCY DEPARTMENT Provider Note   CSN: [id number] Arrival date & time: 01/03/20  1750     History Chief Complaint  Patient presents with  . Cough    Francisco [name] is a 74 y.o. male.  Patient presents to the emergency department with sensation of feeling like there is something stuck in his throat.  This has been ongoing for a couple of weeks.  He denies ever swallowing anything and suddenly feeling something get stuck.  He has had a slight voice change and he feels like he has to keep clearing his throat but is not able to bring anything up.  He is not short of breath.        Past Medical History:  Diagnosis Date  . Back pain   . Bursitis   . DJD (degenerative joint disease)   . GERD (gastroesophageal reflux disease)   . Hypertension   . SBO (small bowel obstruction) (HCC) 08/17/2012  . Scoliosis   . Type 2 diabetes mellitus (HCC) 10/17/2015    Patient Active Problem List   Diagnosis Date Noted  . S/P cholecystectomy   . Pulmonary atelectasis   . Chest pain   . Surgery, elective   . Acute hypoxemic respiratory failure (HCC) 10/19/2015  . Abdominal pain   . Dyspnea   . Acute cholecystitis 10/17/2015  . Hypertension 10/17/2015  . Type 2 diabetes mellitus (HCC) 10/17/2015  . Cholelithiasis and acute cholecystitis without obstruction 10/17/2015  . SHOULDER PAIN, LEFT 10/07/2010  . ELEVATED BLOOD PRESSURE WITHOUT DIAGNOSIS OF HYPERTENSION 10/07/2010  . BACK PAIN 07/26/2010  . TOBACCO ABUSE 10/01/2008  . GERD 05/08/2008  . HAND INJURY 05/08/2008  . 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 BURSITIS, RIGHT SHOULDER 10/26/2007  . SCOLIOSIS, THORACOLUMBAR 10/26/2007    Past Surgical History:  Procedure Laterality Date  . CHOLECYSTECTOMY N/A 10/18/2015   Procedure: LAPAROSCOPIC CHOLECYSTECTOMY WITH INTRAOPERATIVE CHOLANGIOGRAM;  Surgeon: Rodman Pickle, MD;  Location: [facility] OR;  Service: General;  Laterality: N/A;  . NO  PAST SURGERIES         No family history on file.  Social History   Tobacco Use  . Smoking status: Never Smoker  . Smokeless tobacco: Current User    Types: Chew  Substance Use Topics  . Alcohol use: No  . Drug use: No    Home Medications Prior to Admission medications   Medication Sig Start Date End Date Taking? Authorizing Provider  lisinopril (PRINIVIL,ZESTRIL) 10 MG tablet Take 1 tablet (10 mg total) by mouth daily. Patient not taking: Reported on 11/23/2016 11/06/15   Courtney Paris, MD  metFORMIN (GLUCOPHAGE) 500 MG tablet Take 500 mg by mouth 2 (two) times daily with a meal.    [provider]  ondansetron (ZOFRAN ODT) 4 MG disintegrating tablet Take 1 tablet (4 mg total) by mouth every 8 (eight) hours as needed. Patient not taking: Reported on 11/23/2016 10/16/16   Jacalyn Lefevre, MD  ranitidine (ZANTAC) 150 MG tablet Take one tablet daily as needed for heartburn/ reflux Patient not taking: Reported on 11/23/2016 10/26/15   Deneise Lever, MD    Allergies    Patient has no known allergies.  Review of Systems   Review of Systems  HENT: Positive for voice change. Negative for trouble swallowing.   All other systems reviewed and are negative.   Physical Exam Updated Vital Signs BP (!) 143/94   Pulse 65   Temp (!) 97.5 F (36.4 C) (Oral)  Resp (!) 116   Ht 5\' 4"  (1.626 m)   Wt 72.6 kg   SpO2 96%   BMI 27.46 kg/m   Physical Exam Vitals and nursing note reviewed.  Constitutional:      General: He is not in acute distress.    Appearance: Normal appearance. He is well-developed.  HENT:     Head: Normocephalic and atraumatic.     Right Ear: Hearing normal.     Left Ear: Hearing normal.     Nose: Nose normal.  Eyes:     Conjunctiva/sclera: Conjunctivae normal.     Pupils: Pupils are equal, round, and reactive to light.  Cardiovascular:     Rate and Rhythm: Regular rhythm.     Heart sounds: S1 normal and S2 normal. No murmur. No friction rub. No  gallop.   Pulmonary:     Effort: Pulmonary effort is normal. No respiratory distress.     Breath sounds: Normal breath sounds.  Chest:     Chest wall: No tenderness.  Abdominal:     General: Bowel sounds are normal.     Palpations: Abdomen is soft.     Tenderness: There is no abdominal tenderness. There is no guarding or rebound. Negative signs include Murphy's sign and McBurney's sign.     Hernia: No hernia is present.  Musculoskeletal:        General: Normal range of motion.     Cervical back: Normal range of motion and neck supple.  Skin:    General: Skin is warm and dry.     Findings: No rash.  Neurological:     Mental Status: He is alert and oriented to person, place, and time.     GCS: GCS eye subscore is 4. GCS verbal subscore is 5. GCS motor subscore is 6.     Cranial Nerves: No cranial nerve deficit.     Sensory: No sensory deficit.     Coordination: Coordination normal.  Psychiatric:        Speech: Speech normal.        Behavior: Behavior normal.        Thought Content: Thought content normal.     ED Results / Procedures / Treatments   Labs (all labs ordered are listed, but only abnormal results are displayed) Labs Reviewed  I-STAT CHEM 8, ED - Abnormal; Notable for the following components:      Result Value   Glucose, Bld 104 (*)    All other components within normal limits  CBC WITH DIFFERENTIAL/PLATELET    EKG None  Radiology DG Neck Soft Tissue  Result Date: 01/04/2020 CLINICAL DATA:  Patient feels like he has something stuck in his throat x3 weeks. EXAM: NECK SOFT TISSUES - 1+ VIEW COMPARISON:  None. FINDINGS: There is no evidence of retropharyngeal soft tissue swelling or epiglottic enlargement. The cervical airway is unremarkable and no radio-opaque foreign body identified. IMPRESSION: Negative. Electronically Signed   By: 01/06/2020 M.D.   On: 01/04/2020 02:44   DG Chest 2 View  Result Date: 01/04/2020 CLINICAL DATA:  Cough and feels like  he has something stuck in his throat x3 weeks. EXAM: CHEST - 2 VIEW COMPARISON:  October 21, 2015 FINDINGS: There is no evidence of acute infiltrate, pleural effusion or pneumothorax. The heart size and mediastinal contours are within normal limits. There is tortuosity of the descending thoracic aorta. The visualized skeletal structures are unremarkable. IMPRESSION: No active cardiopulmonary disease. Electronically Signed   By: October 23, 2015.D.  On: 01/04/2020 02:43    Procedures Procedures (including critical care time)  Medications Ordered in ED Medications  iohexol (OMNIPAQUE) 300 MG/ML solution 75 mL (75 mLs Intravenous Contrast Given 01/04/20 0708)    ED Course  I have reviewed the triage vital signs and the nursing notes.  Pertinent labs & imaging results that were available during my care of the patient were reviewed by me and considered in my medical decision making (see chart for details).  Clinical Course as of Jan 04 708  Sat Jan 04, 2020  0705 Pt signed out to me by Dr Betsey Holiday.  Briefly 74 yo male who is [place] speaking with son at bedside helping translate who reports sensation of something stuck in his throat.  Pending CT soft tissue.  Patient stable here, no evidence of acute airway compromise per overnight EDP   [MT]    Clinical Course User Index [MT] Trifan, Carola Rhine, MD   MDM Rules/Calculators/A&P                      Patient presents to the emergency department with sensation of something in his throat and voice change.  Symptoms ongoing for several weeks.  Initial chest x-ray and soft tissue x-rays were negative.  Patient son tells me that he has been seen by primary care and was given a phone number to see ENT but they would not see him because of some kind of insurance issue.  Will perform CT scan to evaluate for mass, infection, etc.  If negative, anticipate discharge with outpatient follow-up with ENT on-call.  Will sign out to oncoming ER  physician.  Final Clinical Impression(s) / ED Diagnoses Final diagnoses:  Globus sensation    Rx / DC Orders ED Discharge Orders    None       Shalva Rozycki, Gwenyth Allegra, MD 01/04/20 [phone]

## 2020-01-04 NOTE — Discharge Instructions (Addendum)
Please talk to your primary care doctor about how to follow up with an ENT (ears, nose, throat specialist).  If Francisco [name] continues having this sensation in his throat, he may need to be seen to have a camera look down his throat.    His CT scan was reassuring.  We did not see signs of infection or tumor in his throat. There is no food stuck in his throat.

## 2020-01-04 NOTE — ED Notes (Signed)
Called re ct results

## 2020-06-13 IMAGING — DX DG CHEST 2V
1 series · 1 of 1 positions shown · non-contrast
Comparison: October 21, 2015

CLINICAL DATA: Cough and feels like he has something stuck in his
throat x3 weeks.

EXAM:
CHEST - 2 VIEW

[chest pa]
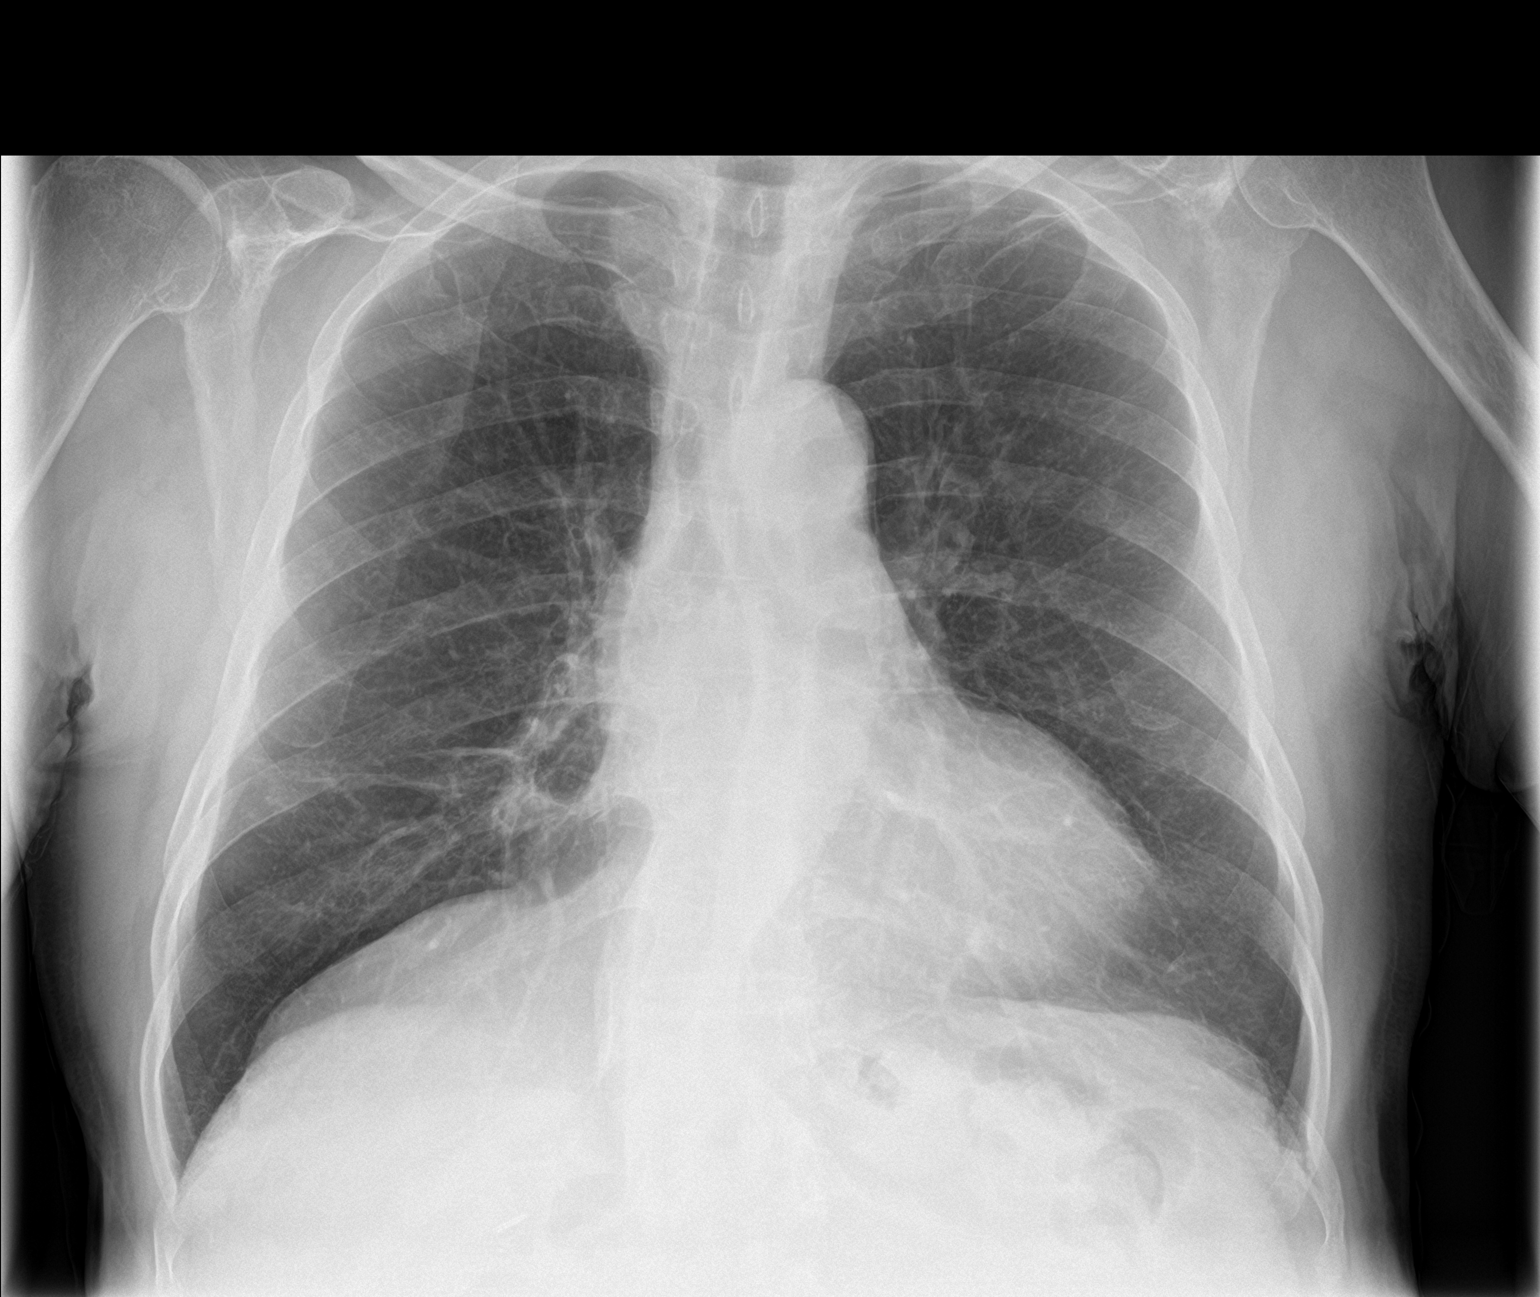

[1 of 1 positions shown; findings below may reference images not displayed]

FINDINGS: There is no evidence of acute infiltrate, pleural effusion or
pneumothorax. The heart size and mediastinal contours are within
normal limits. There is tortuosity of the descending thoracic aorta.
The visualized skeletal structures are unremarkable.
IMPRESSION: No active cardiopulmonary disease.

## 2020-06-13 IMAGING — CT CT NECK W/ CM
4 of 5 series · 15 of 33 positions shown, 17 images · IV contrast (Omni 300)
Comparison: Neck radiographs earlier today. Brain MRI 05/29/2013.

CLINICAL DATA: 73-year-old male with voice changes and sensation of
neck mass/globus sensation x3 weeks.

EXAM:
CT NECK WITH CONTRAST
TECHNIQUE: Multidetector CT imaging of the neck was performed using the
standard protocol following the bolus administration of intravenous
contrast.
CONTRAST:  75mL OMNIPAQUE IOHEXOL 300 MG/ML  SOLN

[Series 3: neck 2.0 st · axial · 0.45mm/px · z∈[+136,+228]mm · 3 of 115 slices shown]
[im 23/115  bone]
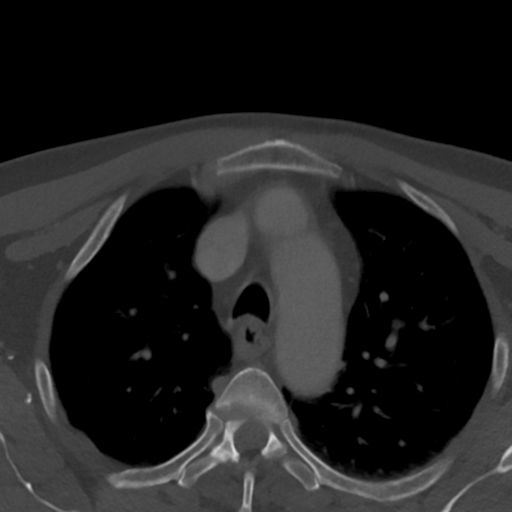
[im 46/115  bone]
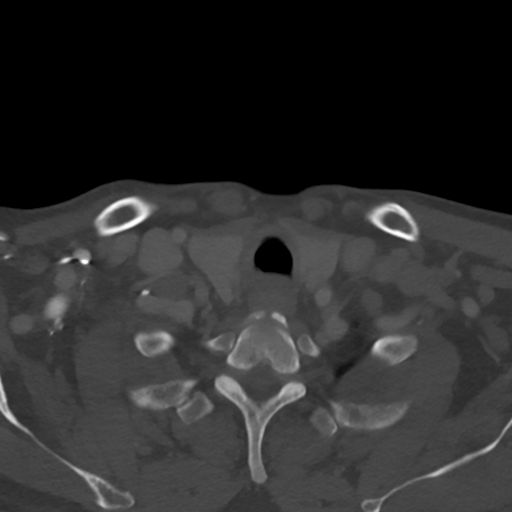
[im 69/115  bone]
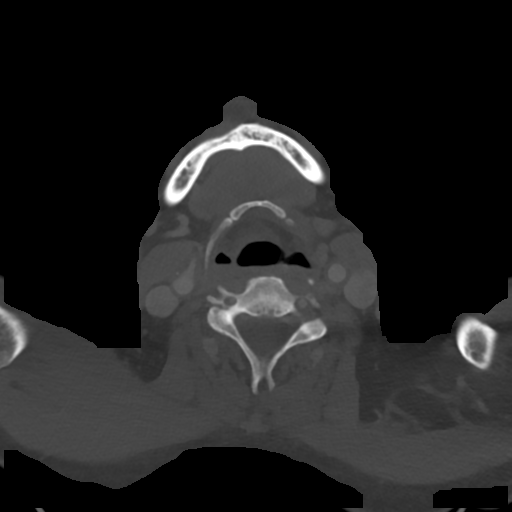

[Series 6: sagittal · sagittal · 0.45mm/px · 5 of 94 slices shown, 6 images]
[im 32/94  bone]
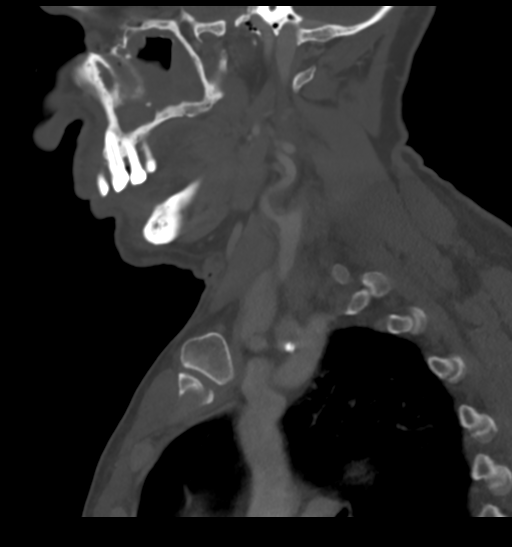
[im 39/94  bone]
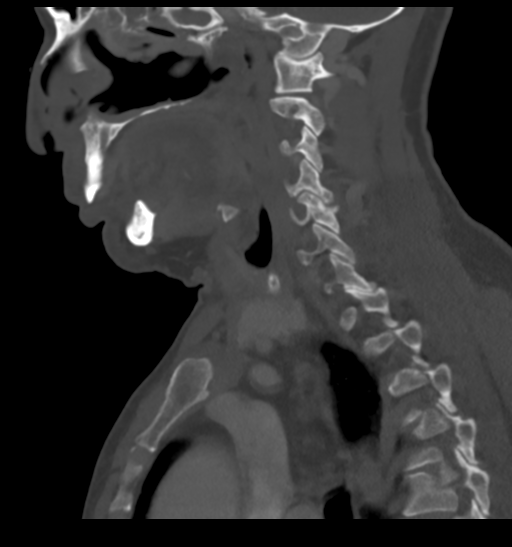
[im 47/94  soft-tissue]
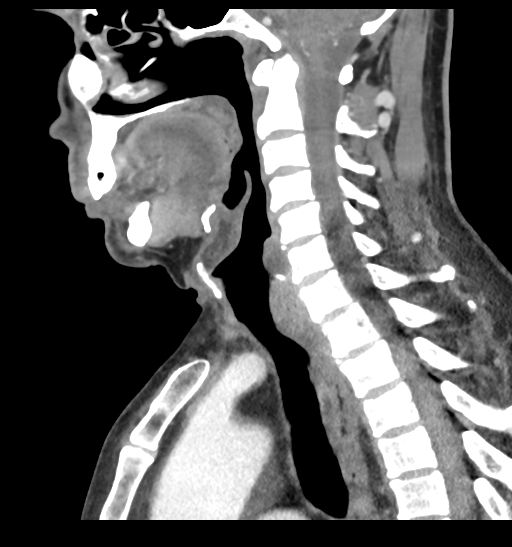
[im 47/94  bone]
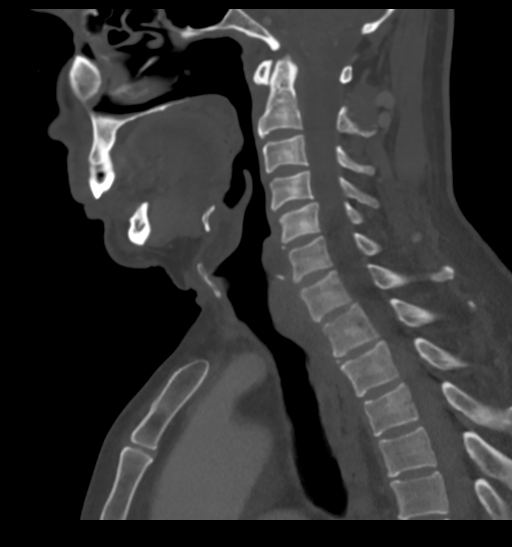
[im 55/94  bone]
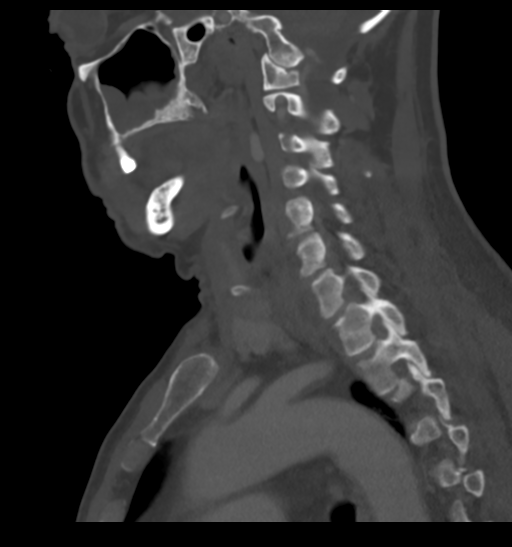
[im 63/94  bone]
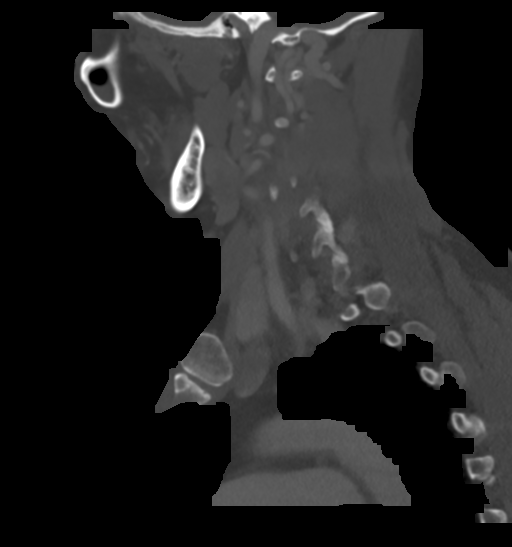

[Series 7: coronal · coronal · 0.41mm/px · 3 of 108 slices shown]
[im 22/108  bone]
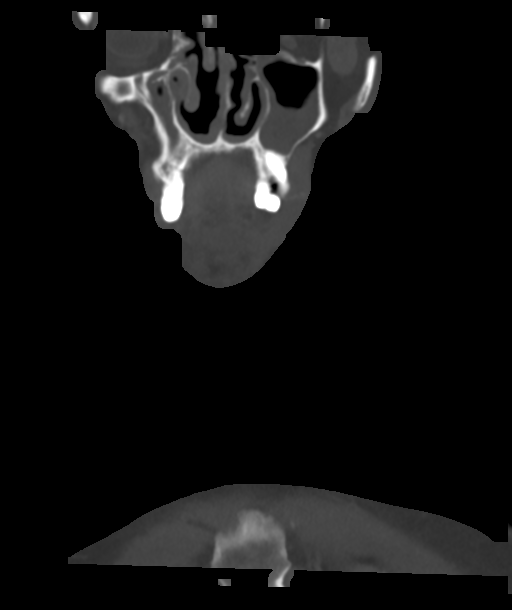
[im 43/108  bone]
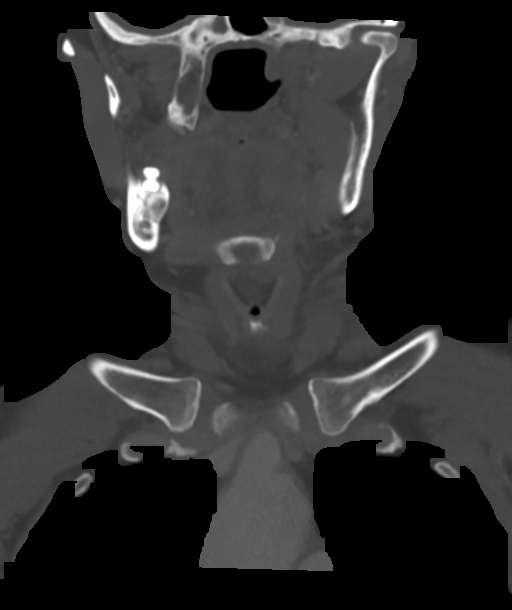
[im 65/108  bone]
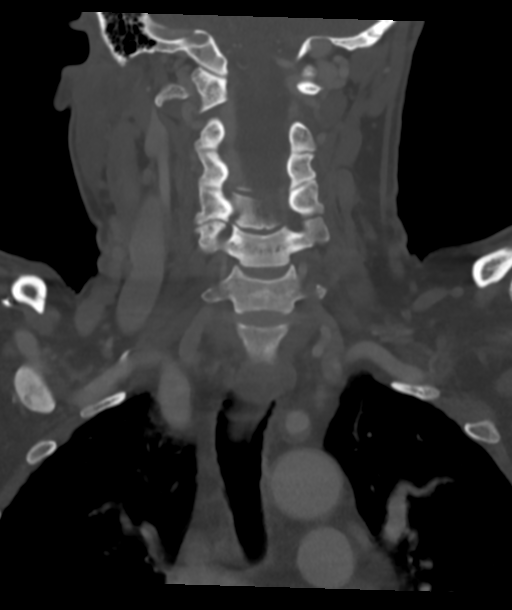

[Series 8: orthogonal · axial · 0.35mm/px · z∈[+86,+249]mm · 4 of 143 slices shown, 5 images]
[im 29/143  soft-tissue]
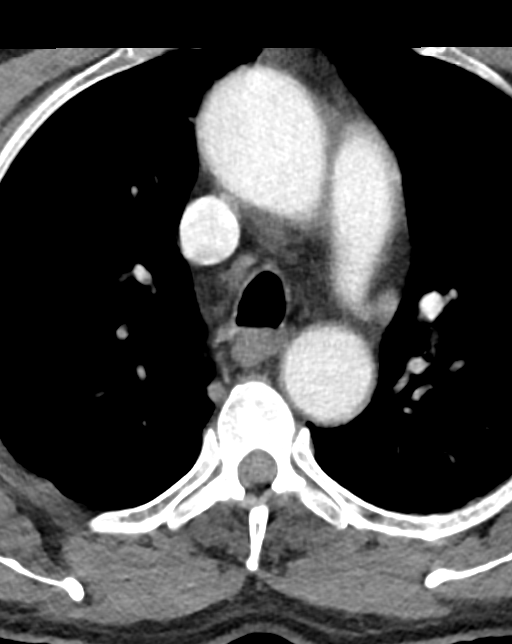
[im 29/143  bone]
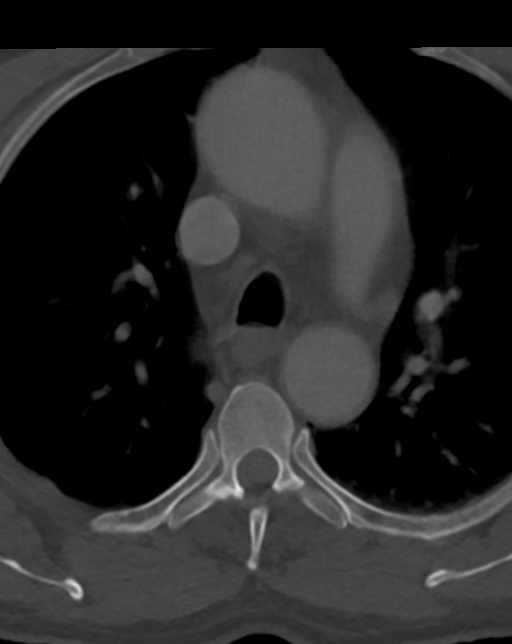
[im 57/143  bone]
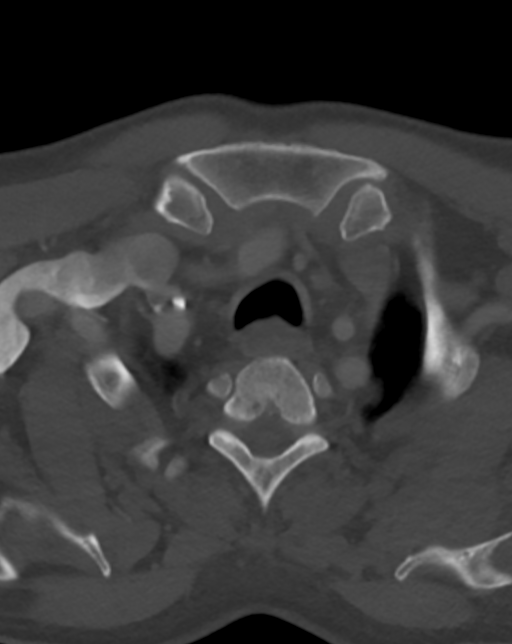
[im 86/143  bone]
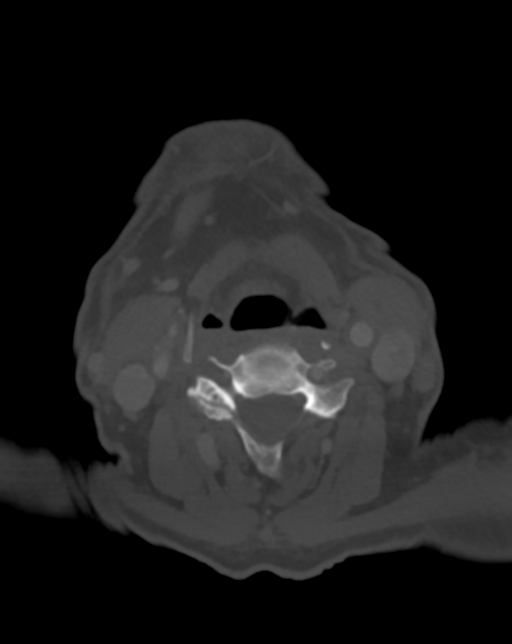
[im 114/143  bone]
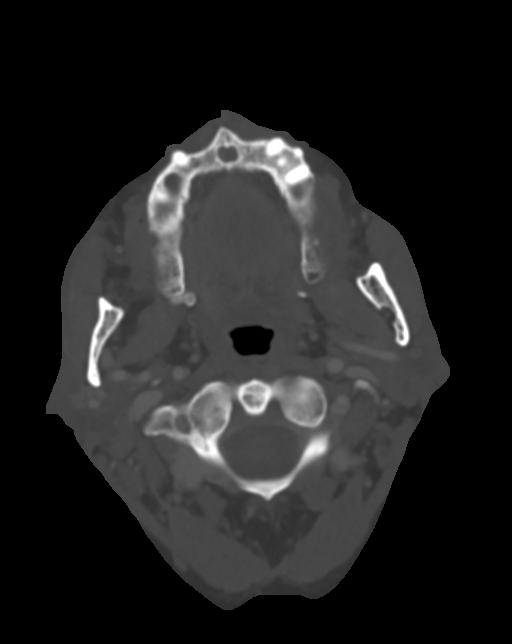

[15 of 33 positions shown; findings below may reference images not displayed]

FINDINGS: Pharynx and larynx: Larynx soft tissue contours are within normal
limits, including the epiglottis. Hypopharynx is within normal
limits. Oropharynx is within normal limits. Nasopharynx also appears
normal. Parapharyngeal and retropharyngeal spaces appear normal.

Salivary glands: Negative sublingual space. Submandibular glands and
parotid glands appear symmetric and normal.

Thyroid: Negative.

Lymph nodes: Negative. No cervical lymphadenopathy. Bilateral
cervical nodes appear symmetric and within normal limits.

Vascular: The major vascular structures in the neck and at the skull
base are patent. Although there is bulky bilateral proximal ICA
noncalcified atherosclerosis. The left vertebral artery is dominant.

Limited intracranial: Minimally included, negative.

Visualized orbits: Negative.

Mastoids and visualized paranasal sinuses: Widespread bilateral
paranasal sinus mucoperiosteal thickening, also present in 3552. But
the visible tympanic cavities and mastoids are clear.

Skeleton: Largely absent mandible and posterior maxilla dentition.
Mild for age degenerative changes in the cervical spine. No acute or
suspicious osseous lesion.

Upper chest: The superior mediastinum is normal and visible through
the carina. Mild tortuosity of the proximal great vessels. No
significant atherosclerosis of the visible aorta. Upper lungs are
clear, although there is suggestion of some centrilobular emphysema.
No axillary lymphadenopathy.
IMPRESSION: 1. Negative neck soft tissues. No neck mass or abnormality to
explain globus sensation or voice changes. ENT follow-up may be
valuable.
2. Pronounced chronic paranasal sinus disease, similar to a 3552
MRI.

## 2020-12-20 ENCOUNTER — Emergency Department (HOSPITAL_COMMUNITY)
Admission: EM | Admit: 2020-12-20 | Discharge: 2020-12-20 | Disposition: A | Discharge status: Home / Self Care | Payer: Medicare Other | Attending: Emergency Medicine | Admitting: Emergency Medicine

## 2020-12-20 ENCOUNTER — Other Ambulatory Visit: Payer: Self-pay

## 2020-12-20 ENCOUNTER — Encounter (HOSPITAL_COMMUNITY): Payer: Self-pay

## 2020-12-20 DIAGNOSIS — F17220 Nicotine dependence, chewing tobacco, uncomplicated: Secondary | ICD-10-CM | POA: Diagnosis not present

## 2020-12-20 DIAGNOSIS — I10 Essential (primary) hypertension: Secondary | ICD-10-CM | POA: Diagnosis not present

## 2020-12-20 DIAGNOSIS — R109 Unspecified abdominal pain: Secondary | ICD-10-CM | POA: Insufficient documentation

## 2020-12-20 DIAGNOSIS — R059 Cough, unspecified: Secondary | ICD-10-CM | POA: Diagnosis not present

## 2020-12-20 DIAGNOSIS — E119 Type 2 diabetes mellitus without complications: Secondary | ICD-10-CM | POA: Diagnosis not present

## 2020-12-20 DIAGNOSIS — R531 Weakness: Secondary | ICD-10-CM

## 2020-12-20 DIAGNOSIS — R030 Elevated blood-pressure reading, without diagnosis of hypertension: Secondary | ICD-10-CM

## 2020-12-20 DIAGNOSIS — E860 Dehydration: Secondary | ICD-10-CM | POA: Diagnosis not present

## 2020-12-20 DIAGNOSIS — R519 Headache, unspecified: Secondary | ICD-10-CM | POA: Diagnosis not present

## 2020-12-20 DIAGNOSIS — Z7984 Long term (current) use of oral hypoglycemic drugs: Secondary | ICD-10-CM | POA: Insufficient documentation

## 2020-12-20 LAB — CBC WITH DIFFERENTIAL/PLATELET
Abs Immature Granulocytes: 0.04 10*3/uL (ref 0.00–0.07)
Basophils Absolute: 0 10*3/uL (ref 0.0–0.1)
Basophils Relative: 0 %
Eosinophils Absolute: 0 10*3/uL (ref 0.0–0.5)
Eosinophils Relative: 0 %
HCT: 42.7 % (ref 39.0–52.0)
Hemoglobin: 14.2 g/dL (ref 13.0–17.0)
Immature Granulocytes: 0 %
Lymphocytes Relative: 14 %
Lymphs Abs: 1.7 10*3/uL (ref 0.7–4.0)
MCH: 28.5 pg (ref 26.0–34.0)
MCHC: 33.3 g/dL (ref 30.0–36.0)
MCV: 85.6 fL (ref 80.0–100.0)
Monocytes Absolute: 0.7 10*3/uL (ref 0.1–1.0)
Monocytes Relative: 6 %
Neutro Abs: 9.4 10*3/uL — ABNORMAL HIGH (ref 1.7–7.7)
Neutrophils Relative %: 80 %
Platelets: 275 10*3/uL (ref 150–400)
RBC: 4.99 MIL/uL (ref 4.22–5.81)
RDW: 14.4 % (ref 11.5–15.5)
WBC: 11.9 10*3/uL — ABNORMAL HIGH (ref 4.0–10.5)
nRBC: 0 % (ref 0.0–0.2)

## 2020-12-20 LAB — COMPREHENSIVE METABOLIC PANEL
ALT: 23 U/L (ref 0–44)
AST: 30 U/L (ref 15–41)
Albumin: 3.9 g/dL (ref 3.5–5.0)
Alkaline Phosphatase: 74 U/L (ref 38–126)
Anion gap: 8 (ref 5–15)
BUN: 18 mg/dL (ref 8–23)
CO2: 29 mmol/L (ref 22–32)
Calcium: 9.5 mg/dL (ref 8.9–10.3)
Chloride: 102 mmol/L (ref 98–111)
Creatinine, Ser: 1.28 mg/dL — ABNORMAL HIGH (ref 0.61–1.24)
GFR, Estimated: 58 mL/min — ABNORMAL LOW (ref >60)
Glucose, Bld: 160 mg/dL — ABNORMAL HIGH (ref 70–99)
Potassium: 4.3 mmol/L (ref 3.5–5.1)
Sodium: 139 mmol/L (ref 135–145)
Total Bilirubin: 1.5 mg/dL — ABNORMAL HIGH (ref 0.3–1.2)
Total Protein: 7.6 g/dL (ref 6.5–8.1)

## 2020-12-20 LAB — LIPASE, BLOOD: Lipase: 40 U/L (ref 11–51)

## 2020-12-20 MED ORDER — SODIUM CHLORIDE 0.9 % IV BOLUS
1000.0000 mL | Freq: Once | INTRAVENOUS | Status: AC
  Administered 2020-12-20: 1000 mL via INTRAVENOUS

## 2020-12-20 NOTE — ED Provider Notes (Signed)
Signed out by Dr Adela Lank at [phone] that pt feels much improved w resolved symptoms, and to d/c to home if/when labs resulted.   Labs resulted. ?mild dehydration. Pt has received iv ns bolus, and is eating/drinking. Pt reports symptoms remain resolved. No pain of any sort, no abd pain, no cp. Denies n/v. No sob.  (pt prefers to use family as interpreter).  Pt currently appears stable for d/c.   Return precautions provided.       Cathren Laine, MD 12/20/20 [phone]

## 2020-12-20 NOTE — ED Triage Notes (Signed)
Weakness, n/v since 0300, nad

## 2020-12-20 NOTE — ED Provider Notes (Signed)
[facility] EMERGENCY DEPARTMENT Provider Note   CSN: [id number] Arrival date & time: 12/20/20  1411     History Chief Complaint  Patient presents with  . Weakness    Sent from urgent care for c/o weakness, n/v since this am    Francisco [name] is a 75 y.o. male.  75 yo M with a chief complaints of headache cough and abdominal pain.  This started about 3 AM this morning.  He is just not felt well.  Cough has been going on for some time and not significantly changed per the family.  Patient currently feels under percent better.  Has no complaints at all.  Family thinks maybe he was dehydrated because got fluid in route with EMS.  He gone to urgent care initially and then was sent over here.  Denies any urinary symptoms denies fevers or chills.  Denies nausea vomiting or diarrhea.  The history is provided by the patient.  Weakness Associated symptoms: no abdominal pain, no arthralgias, no chest pain, no diarrhea, no fever, no headaches, no myalgias, no shortness of breath and no vomiting   Illness Severity:  Moderate Onset quality:  Gradual Duration:  12 hours Timing:  Constant Progression:  Resolved Chronicity:  New Associated symptoms: no abdominal pain, no chest pain, no congestion, no diarrhea, no fever, no headaches, no myalgias, no rash, no shortness of breath and no vomiting        Past Medical History:  Diagnosis Date  . Back pain   . Bursitis   . DJD (degenerative joint disease)   . GERD (gastroesophageal reflux disease)   . Hypertension   . SBO (small bowel obstruction) (HCC) 08/17/2012  . Scoliosis   . Type 2 diabetes mellitus (HCC) 10/17/2015    Patient Active Problem List   Diagnosis Date Noted  . S/P cholecystectomy   . Pulmonary atelectasis   . Chest pain   . Surgery, elective   . Acute hypoxemic respiratory failure (HCC) 10/19/2015  . Abdominal pain   . Dyspnea   . Acute cholecystitis 10/17/2015  . Hypertension 10/17/2015  . Type 2  diabetes mellitus (HCC) 10/17/2015  . Cholelithiasis and acute cholecystitis without obstruction 10/17/2015  . SHOULDER PAIN, LEFT 10/07/2010  . ELEVATED BLOOD PRESSURE WITHOUT DIAGNOSIS OF HYPERTENSION 10/07/2010  . BACK PAIN 07/26/2010  . TOBACCO ABUSE 10/01/2008  . GERD 05/08/2008  . HAND INJURY 05/08/2008  . 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 BURSITIS, RIGHT SHOULDER 10/26/2007  . SCOLIOSIS, THORACOLUMBAR 10/26/2007    Past Surgical History:  Procedure Laterality Date  . CHOLECYSTECTOMY N/A 10/18/2015   Procedure: LAPAROSCOPIC CHOLECYSTECTOMY WITH INTRAOPERATIVE CHOLANGIOGRAM;  Surgeon: Rodman Pickle, MD;  Location: [facility] OR;  Service: General;  Laterality: N/A;  . NO PAST SURGERIES         History reviewed. No pertinent family history.  Social History   Tobacco Use  . Smoking status: Never Smoker  . Smokeless tobacco: Current User    Types: Chew  Substance Use Topics  . Alcohol use: No  . Drug use: No    Home Medications Prior to Admission medications   Medication Sig Start Date End Date Taking? Authorizing Provider  lisinopril (PRINIVIL,ZESTRIL) 10 MG tablet Take 1 tablet (10 mg total) by mouth daily. Patient not taking: Reported on 11/23/2016 11/06/15   Courtney Paris, MD  metFORMIN (GLUCOPHAGE) 500 MG tablet Take 500 mg by mouth 2 (two) times daily with a meal.    [provider]  ondansetron (ZOFRAN ODT) 4 MG disintegrating tablet Take 1 tablet (4 mg total) by mouth every 8 (eight) hours as needed. Patient not taking: Reported on 11/23/2016 10/16/16   Jacalyn Lefevre, MD  ranitidine (ZANTAC) 150 MG tablet Take one tablet daily as needed for heartburn/ reflux Patient not taking: Reported on 11/23/2016 10/26/15   Deneise Lever, MD    Allergies    Patient has no known allergies.  Review of Systems   Review of Systems  Constitutional: Negative for chills and fever.  HENT: Negative for congestion and facial swelling.   Eyes: Negative  for discharge and visual disturbance.  Respiratory: Negative for shortness of breath.   Cardiovascular: Negative for chest pain and palpitations.  Gastrointestinal: Negative for abdominal pain, diarrhea and vomiting.  Musculoskeletal: Negative for arthralgias and myalgias.  Skin: Negative for color change and rash.  Neurological: Positive for weakness. Negative for tremors, syncope and headaches.  Psychiatric/Behavioral: Negative for confusion and dysphoric mood.    Physical Exam Updated Vital Signs BP (!) 131/93 (BP Location: Right Arm)   Pulse 98   Temp 99.7 F (37.6 C) (Oral)   Resp 16   Ht 5\' 3"  (1.6 m)   Wt 70.3 kg   SpO2 94%   BMI 27.46 kg/m   Physical Exam Vitals and nursing note reviewed.  Constitutional:      Appearance: He is well-developed.  HENT:     Head: Normocephalic and atraumatic.  Eyes:     Pupils: Pupils are equal, round, and reactive to light.  Neck:     Vascular: No JVD.  Cardiovascular:     Rate and Rhythm: Normal rate and regular rhythm.     Heart sounds: No murmur heard. No friction rub. No gallop.   Pulmonary:     Effort: No respiratory distress.     Breath sounds: No wheezing.  Abdominal:     General: There is no distension.     Tenderness: There is no abdominal tenderness. There is no guarding or rebound.  Musculoskeletal:        General: Normal range of motion.     Cervical back: Normal range of motion and neck supple.  Skin:    Coloration: Skin is not pale.     Findings: No rash.  Neurological:     Mental Status: He is alert and oriented to person, place, and time.  Psychiatric:        Behavior: Behavior normal.     ED Results / Procedures / Treatments   Labs (all labs ordered are listed, but only abnormal results are displayed) Labs Reviewed  CBC WITH DIFFERENTIAL/PLATELET - Abnormal; Notable for the following components:      Result Value   WBC 11.9 (*)    Neutro Abs 9.4 (*)    All other components within normal limits   COMPREHENSIVE METABOLIC PANEL - Abnormal; Notable for the following components:   Glucose, Bld 160 (*)    Creatinine, Ser 1.28 (*)    Total Bilirubin 1.5 (*)    GFR, Estimated 58 (*)    All other components within normal limits  LIPASE, BLOOD    EKG EKG Interpretation  Date/Time:  Sunday December 20 2020 14:22:38 EDT Ventricular Rate:  99 PR Interval:  157 QRS Duration: 101 QT Interval:  343 QTC Calculation: 441 R Axis:   29 Text Interpretation: Sinus rhythm Probable left atrial enlargement Abnormal R-wave progression, late transition Borderline T wave abnormalities No significant change since last tracing Confirmed by [id number] (  [id number]) on 12/20/2020 2:25:20 PM Also confirmed by Melene Plan [id number]), editor Elita Quick [phone])  on 12/21/2020 7:03:19 AM   Radiology No results found.  Procedures Procedures   Medications Ordered in ED Medications  sodium chloride 0.9 % bolus 1,000 mL (0 mLs Intravenous Stopped 12/20/20 1630)    ED Course  I have reviewed the triage vital signs and the nursing notes.  Pertinent labs & imaging results that were available during my care of the patient were reviewed by me and considered in my medical decision making (see chart for details).    MDM Rules/Calculators/A&P                          75 yo M with a chief complaints of feeling unwell.  Going on for the past day.  Now feels better.  Family thinks maybe he was dehydrated.  Will give a bolus of fluids, labwork.  Have him eat and drink and reassess.   Patient care signed out to Dr. Denton Lank, please see his note for further details care in ED.  The patients results and plan were reviewed and discussed.   Any x-rays performed were independently reviewed by myself.   Differential diagnosis were considered with the presenting HPI.  Medications  sodium chloride 0.9 % bolus 1,000 mL (0 mLs Intravenous Stopped 12/20/20 1630)    Vitals:   12/20/20 1445 12/20/20 1530 12/20/20 1600  12/20/20 1654  BP: 112/90 (!) 162/86 (!) 157/97 (!) 131/93  Pulse: 98 97 93 98  Resp: 14 14 12 16   Temp:    99.7 F (37.6 C)  TempSrc:    Oral  SpO2: 95% 97% 96% 94%  Weight:      Height:        Final diagnoses:  Generalized weakness  Dehydration  Elevated blood pressure reading      Final Clinical Impression(s) / ED Diagnoses Final diagnoses:  Generalized weakness  Dehydration  Elevated blood pressure reading    Rx / DC Orders ED Discharge Orders    None       , DO 12/21/20 02/20/21

## 2020-12-20 NOTE — Discharge Instructions (Signed)
It was our pleasure to provide your ER care today - we hope that you feel better.  Drink plenty of fluids/stay well hydrated.   Follow up with primary care doctor in the next 2-3 days if symptoms fail to improve/resolve. Also follow up with primary care doctor for recheck of your blood pressure, as it is high today.   Return to ER if worse, new symptoms, fevers, new, worsening or severe abdominal pain, vomiting, chest pain, trouble breathing, weak/fainting, or other concern.

## 2021-04-12 ENCOUNTER — Encounter: Payer: Self-pay | Admitting: Gastroenterology

## 2023-10-06 ENCOUNTER — Encounter: Payer: Self-pay | Admitting: Gastroenterology

## 2023-10-14 ENCOUNTER — Emergency Department (HOSPITAL_COMMUNITY)
Admission: EM | Admit: 2023-10-14 | Discharge: 2023-10-15 | Disposition: A | Discharge status: Home / Self Care | Payer: Medicare Other | Attending: Emergency Medicine | Admitting: Emergency Medicine

## 2023-10-14 ENCOUNTER — Other Ambulatory Visit: Payer: Self-pay

## 2023-10-14 DIAGNOSIS — R519 Headache, unspecified: Secondary | ICD-10-CM | POA: Insufficient documentation

## 2023-10-14 DIAGNOSIS — E119 Type 2 diabetes mellitus without complications: Secondary | ICD-10-CM | POA: Diagnosis not present

## 2023-10-14 DIAGNOSIS — J189 Pneumonia, unspecified organism: Secondary | ICD-10-CM | POA: Insufficient documentation

## 2023-10-14 DIAGNOSIS — R112 Nausea with vomiting, unspecified: Secondary | ICD-10-CM

## 2023-10-14 DIAGNOSIS — Z20822 Contact with and (suspected) exposure to covid-19: Secondary | ICD-10-CM | POA: Insufficient documentation

## 2023-10-14 DIAGNOSIS — F17290 Nicotine dependence, other tobacco product, uncomplicated: Secondary | ICD-10-CM | POA: Insufficient documentation

## 2023-10-14 DIAGNOSIS — Z7984 Long term (current) use of oral hypoglycemic drugs: Secondary | ICD-10-CM | POA: Insufficient documentation

## 2023-10-14 DIAGNOSIS — Z79899 Other long term (current) drug therapy: Secondary | ICD-10-CM | POA: Diagnosis not present

## 2023-10-14 DIAGNOSIS — R09A2 Foreign body sensation, throat: Secondary | ICD-10-CM

## 2023-10-14 DIAGNOSIS — I10 Essential (primary) hypertension: Secondary | ICD-10-CM | POA: Diagnosis not present

## 2023-10-14 LAB — COMPREHENSIVE METABOLIC PANEL
ALT: 19 U/L (ref 0–44)
AST: 25 U/L (ref 15–41)
Albumin: 3.9 g/dL (ref 3.5–5.0)
Alkaline Phosphatase: 83 U/L (ref 38–126)
Anion gap: 12 (ref 5–15)
BUN: 8 mg/dL (ref 8–23)
CO2: 25 mmol/L (ref 22–32)
Calcium: 9.7 mg/dL (ref 8.9–10.3)
Chloride: 101 mmol/L (ref 98–111)
Creatinine, Ser: 0.91 mg/dL (ref 0.61–1.24)
GFR, Estimated: >60 mL/min (ref >60)
Glucose, Bld: 158 mg/dL — ABNORMAL HIGH (ref 70–99)
Potassium: 3.9 mmol/L (ref 3.5–5.1)
Sodium: 138 mmol/L (ref 135–145)
Total Bilirubin: 0.8 mg/dL (ref 0.0–1.2)
Total Protein: 7.8 g/dL (ref 6.5–8.1)

## 2023-10-14 LAB — URINALYSIS, ROUTINE W REFLEX MICROSCOPIC
Bilirubin Urine: NEGATIVE
Glucose, UA: NEGATIVE mg/dL
Hgb urine dipstick: NEGATIVE
Ketones, ur: NEGATIVE mg/dL
Leukocytes,Ua: NEGATIVE
Nitrite: NEGATIVE
Protein, ur: NEGATIVE mg/dL
Specific Gravity, Urine: 1.013 (ref 1.005–1.030)
pH: 6 (ref 5.0–8.0)

## 2023-10-14 LAB — CBC
HCT: 42.3 % (ref 39.0–52.0)
Hemoglobin: 13.9 g/dL (ref 13.0–17.0)
MCH: 27.9 pg (ref 26.0–34.0)
MCHC: 32.9 g/dL (ref 30.0–36.0)
MCV: 84.8 fL (ref 80.0–100.0)
Platelets: 309 10*3/uL (ref 150–400)
RBC: 4.99 MIL/uL (ref 4.22–5.81)
RDW: 14.2 % (ref 11.5–15.5)
WBC: 8.3 10*3/uL (ref 4.0–10.5)
nRBC: 0 % (ref 0.0–0.2)

## 2023-10-14 LAB — RESP PANEL BY RT-PCR (RSV, FLU A&B, COVID)  RVPGX2
Influenza A by PCR: NEGATIVE
Influenza B by PCR: NEGATIVE
Resp Syncytial Virus by PCR: NEGATIVE
SARS Coronavirus 2 by RT PCR: NEGATIVE

## 2023-10-14 LAB — CBG MONITORING, ED: Glucose-Capillary: 159 mg/dL — ABNORMAL HIGH (ref 70–99)

## 2023-10-14 LAB — LIPASE, BLOOD: Lipase: 40 U/L (ref 11–51)

## 2023-10-14 NOTE — ED Provider Triage Note (Signed)
Emergency Medicine Provider Triage Evaluation Note  Francisco [name] , a 78 y.o. male  was evaluated in triage.  Pt complains of nausea and vomiting. Same began today. Traveled here today and reportedly vomited most of the trip.  Denies diarrhea, denies abdominal pain.  No fevers or chills.  No cough or congestion.  No chest pain or shortness of breath.  Does note that he has had back pain for the last few weeks in his low back area.  Pain does not radiate down his legs.  He is able to walk without issue.  No urinary symptoms.  Also states he feels like he has a lump in his throat.  This has been ongoing for numerous years and he has been evaluated for this previously, however he feels like it is worse today.  Denies dysphagia.  Review of Systems  Positive:  Negative:   Physical Exam  BP (!) 133/95 (BP Location: Right Arm)   Pulse 88   Temp 97.9 F (36.6 C)   Resp 16   SpO2 93%  Gen:   Awake, no distress   Resp:  Normal effort  MSK:   Moves extremities without difficulty  Other:  Throat appears normal, no visualized lump on exam.  Medical Decision Making  Medically screening exam initiated at 8:47 PM.  Appropriate orders placed.  Kingstin Heims was informed that the remainder of the evaluation will be completed by another provider, this initial triage assessment does not replace that evaluation, and the importance of remaining in the ED until their evaluation is complete.     Vear Clock 10/14/23 2049

## 2023-10-14 NOTE — ED Triage Notes (Signed)
Pt has multiple complaints.   PT has felt lump in throat for months and feels like he is getting hoarse. Also complaints of back pain for years. Today has been vomiting and feels weak.

## 2023-10-15 ENCOUNTER — Emergency Department (HOSPITAL_COMMUNITY): Payer: Medicare Other

## 2023-10-15 DIAGNOSIS — J189 Pneumonia, unspecified organism: Secondary | ICD-10-CM | POA: Diagnosis not present

## 2023-10-15 MED ORDER — ACETAMINOPHEN 500 MG PO TABS
1000.0000 mg | ORAL_TABLET | Freq: Once | ORAL | Status: AC
  Administered 2023-10-15: 1000 mg via ORAL
  Filled 2023-10-15: qty 2

## 2023-10-15 MED ORDER — ONDANSETRON HCL 4 MG/2ML IJ SOLN
4.0000 mg | Freq: Once | INTRAMUSCULAR | Status: AC
  Administered 2023-10-15: 4 mg via INTRAVENOUS
  Filled 2023-10-15: qty 2

## 2023-10-15 MED ORDER — LIDOCAINE VISCOUS HCL 2 % MT SOLN
15.0000 mL | Freq: Once | OROMUCOSAL | Status: AC
  Administered 2023-10-15: 15 mL via ORAL
  Filled 2023-10-15: qty 15

## 2023-10-15 MED ORDER — ONDANSETRON HCL 4 MG PO TABS: 4.0000 mg | ORAL_TABLET | ORAL | 0 refills | Status: AC | PRN

## 2023-10-15 MED ORDER — ALUM & MAG HYDROXIDE-SIMETH 200-200-20 MG/5ML PO SUSP
30.0000 mL | Freq: Once | ORAL | Status: AC
  Administered 2023-10-15: 30 mL via ORAL
  Filled 2023-10-15: qty 30

## 2023-10-15 MED ORDER — PANTOPRAZOLE SODIUM 20 MG PO TBEC: 20.0000 mg | DELAYED_RELEASE_TABLET | Freq: Every day | ORAL | 0 refills | Status: DC

## 2023-10-15 MED ORDER — IOHEXOL 350 MG/ML SOLN
75.0000 mL | Freq: Once | INTRAVENOUS | Status: AC | PRN
  Administered 2023-10-15: 75 mL via INTRAVENOUS

## 2023-10-15 MED ORDER — AZITHROMYCIN 250 MG PO TABS: 250.0000 mg | ORAL_TABLET | Freq: Every day | ORAL | 0 refills | Status: AC

## 2023-10-15 MED ORDER — MAALOX MAX 400-400-40 MG/5ML PO SUSP: 5.0000 mL | Freq: Four times a day (QID) | ORAL | 0 refills | Status: AC | PRN

## 2023-10-15 MED ORDER — MAALOX MAX 400-400-40 MG/5ML PO SUSP: 5.0000 mL | Freq: Four times a day (QID) | ORAL | 0 refills | Status: DC | PRN

## 2023-10-15 MED ORDER — SUCRALFATE 1 G PO TABS: 1.0000 g | ORAL_TABLET | Freq: Three times a day (TID) | ORAL | 0 refills | Status: AC

## 2023-10-15 MED ORDER — AZITHROMYCIN 250 MG PO TABS: 250.0000 mg | ORAL_TABLET | Freq: Every day | ORAL | 0 refills | Status: DC

## 2023-10-15 MED ORDER — PANTOPRAZOLE SODIUM 20 MG PO TBEC
20.0000 mg | DELAYED_RELEASE_TABLET | Freq: Every day | ORAL | 0 refills | Status: AC
Start: 2023-10-15 — End: 2023-10-29

## 2023-10-15 MED ORDER — SUCRALFATE 1 G PO TABS: 1.0000 g | ORAL_TABLET | Freq: Three times a day (TID) | ORAL | 0 refills | Status: DC

## 2023-10-15 NOTE — ED Notes (Signed)
 ED Provider at bedside.

## 2023-10-15 NOTE — Discharge Instructions (Signed)
It was a pleasure caring for you today in the emergency department.  Be sure to bland diet over the next 2 weeks.  Follow-up with gastroenterology in around 2 weeks.  Please return to the emergency department for any worsening or worrisome symptoms.

## 2023-10-15 NOTE — ED Provider Notes (Signed)
[facility]ERGENCY DEPARTMENT AT [facility] Provider Note  CSN: [id number] Arrival date & time: 10/14/23 7829  Chief Complaint(s) Weakness  HPI Francisco [name] is a 78 y.o. male with past medical history as below, significant for GERD, hypertension, type II DM, cholecystectomy who presents to the ED with complaint of abdominal pain, nausea vomiting, throat discomfort.  He is here with grandson who is interpreting, medical interpreter was offered but family prefers to interpret.  Patient recently traveled from [place] this morning, began having nausea, vomiting, diarrhea this morning.  Emesis is nonbloody nonbilious.  No fevers or chills.  Having abdominal cramping to the epigastrium.  Last episode emesis when the lobby.  Had diarrhea in the lobby.  Nonbloody, nonmelanotic.  Also has a mild headache.  No numbness or weakness of extremities, gait is not abnormal.  Not described as worst headache of his life.  No medication prior to arrival.  Reports burning sensation in the back of his throat.  Has had this in the past.  Worsened after vomiting.  Past Medical History Past Medical History:  Diagnosis Date   Back pain    Bursitis    DJD (degenerative joint disease)    GERD (gastroesophageal reflux disease)    Hypertension    SBO (small bowel obstruction) (HCC) 08/17/2012   Scoliosis    Type 2 diabetes mellitus (HCC) 10/17/2015   Patient Active Problem List   Diagnosis Date Noted   S/P cholecystectomy    Pulmonary atelectasis    Chest pain    Surgery, elective    Acute hypoxemic respiratory failure (HCC) 10/19/2015   Abdominal pain    Dyspnea    Acute cholecystitis 10/17/2015   Hypertension 10/17/2015   Type 2 diabetes mellitus (HCC) 10/17/2015   Cholelithiasis and acute cholecystitis without obstruction 10/17/2015   SHOULDER PAIN, LEFT 10/07/2010   ELEVATED BLOOD PRESSURE WITHOUT DIAGNOSIS OF HYPERTENSION 10/07/2010   Backache 07/26/2010   TOBACCO ABUSE 10/01/2008   GERD  05/08/2008   HAND INJURY 05/08/2008   DEGENERATIVE DISC DISEASE, 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 Authorizing Provider  ondansetron (ZOFRAN) 4 MG tablet Take 1 tablet (4 mg total) by mouth every 4 (four) hours as needed for nausea or vomiting. 10/15/23  Yes Tanda Rockers A, DO  alum & mag hydroxide-simeth (MAALOX MAX) 400-400-40 MG/5ML suspension Take 5 mLs by mouth every 6 (six) hours as needed for indigestion. 10/15/23   Sloan Leiter, DO  azithromycin (ZITHROMAX) 250 MG tablet Take 1 tablet (250 mg total) by mouth daily. Take first 2 tablets together, then 1 every day until finished. 10/15/23   Sloan Leiter, DO  lisinopril (PRINIVIL,ZESTRIL) 10 MG tablet Take 1 tablet (10 mg total) by mouth daily. Patient not taking: Reported on 11/23/2016 11/06/15   Courtney Paris, MD  metFORMIN (GLUCOPHAGE) 500 MG tablet Take 500 mg by mouth 2 (two) times daily with a meal.    [provider]  pantoprazole (PROTONIX) 20 MG tablet Take 1 tablet (20 mg total) by mouth daily for 14 days. 10/15/23 10/29/23  Sloan Leiter, DO  sucralfate (CARAFATE) 1 g tablet Take 1 tablet (1 g total) by mouth with breakfast, with lunch, and with evening meal for 7 days. 10/15/23 10/22/23  Sloan Leiter, DO  Past Surgical History Past Surgical History:  Procedure Laterality Date   CHOLECYSTECTOMY N/A 10/18/2015   Procedure: LAPAROSCOPIC CHOLECYSTECTOMY WITH INTRAOPERATIVE CHOLANGIOGRAM;  Surgeon: Rodman Pickle, MD;  Location: [facility] OR;  Service: General;  Laterality: N/A;   NO PAST SURGERIES     Family History No family history on file.  Social History Social History   Tobacco Use    Smoking status: Never   Smokeless tobacco: Current    Types: Chew  Substance Use Topics   Alcohol use: No   Drug use: No   Allergies Patient has no known allergies.  Review of Systems Review of Systems  Constitutional:  Negative for chills and fever.  Respiratory:  Negative for cough, chest tightness and shortness of breath.   Cardiovascular:  Negative for chest pain and palpitations.  Gastrointestinal:  Positive for abdominal pain, diarrhea, nausea and vomiting. Negative for blood in stool.  Genitourinary:  Negative for difficulty urinating and dysuria.  Musculoskeletal:  Positive for arthralgias.  Neurological:  Positive for headaches. Negative for light-headedness and numbness.  Psychiatric/Behavioral:  Negative for agitation.   All other systems reviewed and are negative.   Physical Exam Vital Signs  I have reviewed the triage vital signs BP (!) 148/97 (BP Location: Left Arm)   Pulse 70   Temp 97.7 F (36.5 C) (Oral)   Resp 16   Ht 5\' 3"  (1.6 m)   Wt 69.9 kg   SpO2 95%   BMI 27.28 kg/m  Physical Exam Vitals and nursing note reviewed.  Constitutional:      General: He is not in acute distress.    Appearance: He is well-developed.  HENT:     Head: Normocephalic and atraumatic.     Right Ear: External ear normal.     Left Ear: External ear normal.     Mouth/Throat:     Mouth: Mucous membranes are moist.  Eyes:     General: No scleral icterus. Cardiovascular:     Rate and Rhythm: Normal rate and regular rhythm.     Pulses: Normal pulses.     Heart sounds: Normal heart sounds.  Pulmonary:     Effort: Pulmonary effort is normal. No respiratory distress.     Breath sounds: Normal breath sounds.  Abdominal:     General: Abdomen is flat.     Palpations: Abdomen is soft.     Tenderness: There is no abdominal tenderness.  Musculoskeletal:     Cervical back: No rigidity.     Right lower leg: No edema.     Left lower leg: No edema.  Skin:    General: Skin is  warm and dry.     Capillary Refill: Capillary refill takes less than 2 seconds.  Neurological:     General: No focal deficit present.     Mental Status: He is alert. Mental status is at baseline.     GCS: GCS eye subscore is 4. GCS verbal subscore is 5. GCS motor subscore is 6.     Cranial Nerves: Cranial nerves 2-12 are intact. No dysarthria or facial asymmetry.     Sensory: Sensation is intact.     Motor: Motor function is intact. No tremor.     Coordination: Coordination normal.     Gait: Gait is intact.  Psychiatric:        Mood and Affect: Mood normal.        Behavior: Behavior normal.     ED Results and Treatments Labs (all labs ordered are  listed, but only abnormal results are displayed) Labs Reviewed  COMPREHENSIVE METABOLIC PANEL - Abnormal; Notable for the following components:      Result Value   Glucose, Bld 158 (*)    All other components within normal limits  CBG MONITORING, ED - Abnormal; Notable for the following components:   Glucose-Capillary 159 (*)    All other components within normal limits  RESP PANEL BY RT-PCR (RSV, FLU A&B, COVID)  RVPGX2  CBC  URINALYSIS, ROUTINE W REFLEX MICROSCOPIC  LIPASE, BLOOD                                                                                                                          Radiology CT ABDOMEN PELVIS W CONTRAST Result Date: 10/15/2023 CLINICAL DATA:  Epigastric pain, weakness, headache EXAM: CT ABDOMEN AND PELVIS WITH CONTRAST TECHNIQUE: Multidetector CT imaging of the abdomen and pelvis was performed using the standard protocol following bolus administration of intravenous contrast. RADIATION DOSE REDUCTION: This exam was performed according to the departmental dose-optimization program which includes automated exposure control, adjustment of the mA and/or kV according to patient size and/or use of iterative reconstruction technique. CONTRAST:  75mL OMNIPAQUE IOHEXOL 350 MG/ML SOLN COMPARISON:  CT abdomen and  pelvis 10/16/2016 FINDINGS: Lower chest: Patchy ground-glass opacities in the lower lobes. Cardiomegaly. Hepatobiliary: Cholecystectomy. No biliary dilation. Hepatic steatosis. Pancreas: Unremarkable. Spleen: Unremarkable. Adrenals/Urinary Tract: Normal adrenal glands. No urinary calculi or hydronephrosis. Unremarkable bladder. Stomach/Bowel: No bowel obstruction or bowel wall thickening. Stomach and appendix are within normal limits. Vascular/Lymphatic: No significant vascular findings are present. No enlarged abdominal or pelvic lymph nodes. Reproductive: Unremarkable. Other: No free intraperitoneal fluid or air. Musculoskeletal: No acute fracture. IMPRESSION: 1. No acute abnormality in the abdomen or pelvis. 2. Hepatic steatosis. 3. Patchy ground-glass opacities in the lower lobes suggestive of atypical infection. Electronically Signed   By: Minerva Fester M.D.   On: 10/15/2023 03:49   CT Head Wo Contrast Result Date: 10/15/2023 CLINICAL DATA:  Headache, increasing frequency or severity EXAM: CT HEAD WITHOUT CONTRAST TECHNIQUE: Contiguous axial images were obtained from the base of the skull through the vertex without intravenous contrast. RADIATION DOSE REDUCTION: This exam was performed according to the departmental dose-optimization program which includes automated exposure control, adjustment of the mA and/or kV according to patient size and/or use of iterative reconstruction technique. COMPARISON:  MRI head 05/29/2013 FINDINGS: Brain: No evidence of acute infarction, hemorrhage, hydrocephalus, extra-axial collection or mass lesion/mass effect. Vascular: No hyperdense vessel identified. Skull: No acute fracture. Sinuses/Orbits: Moderate right maxillary sinus and ethmoid air cell mucosal thickening. Total right sphenoid sinus opacification. IMPRESSION: 1. No evidence of acute intracranial abnormality. 2. Paranasal sinus disease. Correlate with signs/symptoms of sinusitis. Electronically Signed   By:  Feliberto Harts M.D.   On: 10/15/2023 03:47    Pertinent labs & imaging results that were available during my care of the patient were reviewed by me and considered in my medical decision making (see MDM  for details).  Medications Ordered in ED Medications  alum & mag hydroxide-simeth (MAALOX/MYLANTA) 200-200-20 MG/5ML suspension 30 mL (30 mLs Oral Given 10/15/23 0249)    And  lidocaine (XYLOCAINE) 2 % viscous mouth solution 15 mL (15 mLs Oral Given 10/15/23 0249)  acetaminophen (TYLENOL) tablet 1,000 mg (1,000 mg Oral Given 10/15/23 0249)  ondansetron (ZOFRAN) injection 4 mg (4 mg Intravenous Given 10/15/23 0248)  iohexol (OMNIPAQUE) 350 MG/ML injection 75 mL (75 mLs Intravenous Contrast Given 10/15/23 0342)                                                                                                                                     Procedures Procedures  (including critical care time)  Medical Decision Making / ED Course    Medical Decision Making:    Wrangler Penning is a 78 y.o. male  with past medical history as below, significant for GERD, hypertension, type II DM, cholecystectomy who presents to the ED with complaint of abdominal pain, nausea vomiting, throat discomfort.. The complaint involves an extensive differential diagnosis and also carries with it a high risk of complications and morbidity.  Serious etiology was considered. Ddx includes but is not limited to: Differential diagnosis includes but is not exclusive to acute cholecystitis, intrathoracic causes for epigastric abdominal pain, gastritis, duodenitis, pancreatitis, small bowel or large bowel obstruction, abdominal aortic aneurysm, hernia, gastritis, etc.   Complete initial physical exam performed, notably the patient was in no distress, sitting comfortably on stretcher.    Reviewed and confirmed nursing documentation for past medical history, family history, social history.  Vital signs reviewed.      Clinical Course  as of 10/15/23 0458  Sun Oct 15, 2023  0409 CT a/p concerning for possible pna; he has been coughing somewhat, body aches, fever/chills. Will cover with abx for pna [SG]    Clinical Course User Index [SG] Sloan Leiter, DO    Brief summary: 78 year old male history as above here with abdominal pain, nausea vomiting, body aches, headache, diarrhea.  Screening labs sent in triage reviewed and reassuring.  Will get CT imaging of the abdomen pelvis also the head.  Give zofran/ gi cocktail. Re-assess.   Does have history of globus sensation, seen in the ED 4/21 is also seen by ENT for this problem.  Per ENT evaluation believe to be secondary to acid reflux, started on PPI.  At repeat visit in 2022 flexible endoscopy was performed which showed then consistent with reflux.  Imaging reviewed, possible atypical pneumonia. Give azithro. Imaging labs otherwise stable.  He is feeling much better after GI cocktail  He would likely benefit from GI follow-up.  Resume PPI, add Carafate.  Bland diet.  The patient improved significantly and was discharged in stable condition. Detailed discussions were had with the patient/guardian regarding current findings, and need for close f/u with PCP or on call doctor. The patient/guardian has been instructed to  return immediately if the symptoms worsen in any way for re-evaluation. Patient/guardian verbalized understanding and is in agreement with current care plan. All questions answered prior to discharge.          Additional history obtained: -Additional history obtained from family -External records from outside source obtained and reviewed including: Chart review including previous notes, labs, imaging, consultation notes including  Prior ED visits, home medications.   Lab Tests: -I ordered, reviewed, and interpreted labs.   The pertinent results include:   Labs Reviewed  COMPREHENSIVE METABOLIC PANEL - Abnormal; Notable for the following  components:      Result Value   Glucose, Bld 158 (*)    All other components within normal limits  CBG MONITORING, ED - Abnormal; Notable for the following components:   Glucose-Capillary 159 (*)    All other components within normal limits  RESP PANEL BY RT-PCR (RSV, FLU A&B, COVID)  RVPGX2  CBC  URINALYSIS, ROUTINE W REFLEX MICROSCOPIC  LIPASE, BLOOD    Notable for labs stable  EKG   EKG Interpretation Date/Time:  Saturday October 14 2023 20:52:02 EST Ventricular Rate:  85 PR Interval:  170 QRS Duration:  92 QT Interval:  370 QTC Calculation: 440 R Axis:   67  Text Interpretation: Normal sinus rhythm Normal ECG When compared with ECG of 20-Dec-2020 14:22, PREVIOUS ECG IS PRESENT Confirmed by Tanda Rockers ([id number]) on 10/15/2023 2:35:02 AM         Imaging Studies ordered: I ordered imaging studies including CTH CTAP I independently visualized the following imaging with scope of interpretation limited to determining acute life threatening conditions related to emergency care; findings noted above I independently visualized and interpreted imaging. I agree with the radiologist interpretation   Medicines ordered and prescription drug management: Meds ordered this encounter  Medications   AND Linked Order Group    alum & mag hydroxide-simeth (MAALOX/MYLANTA) 200-200-20 MG/5ML suspension 30 mL    lidocaine (XYLOCAINE) 2 % viscous mouth solution 15 mL   acetaminophen (TYLENOL) tablet 1,000 mg   ondansetron (ZOFRAN) injection 4 mg   DISCONTD: pantoprazole (PROTONIX) 20 MG tablet    Sig: Take 1 tablet (20 mg total) by mouth daily for 14 days.    Dispense:  14 tablet    Refill:  0   DISCONTD: sucralfate (CARAFATE) 1 g tablet    Sig: Take 1 tablet (1 g total) by mouth with breakfast, with lunch, and with evening meal for 7 days.    Dispense:  21 tablet    Refill:  0   iohexol (OMNIPAQUE) 350 MG/ML injection 75 mL   DISCONTD: azithromycin (ZITHROMAX) 250 MG tablet    Sig:  Take 1 tablet (250 mg total) by mouth daily. Take first 2 tablets together, then 1 every day until finished.    Dispense:  6 tablet    Refill:  0   DISCONTD: alum & mag hydroxide-simeth (MAALOX MAX) 400-400-40 MG/5ML suspension    Sig: Take 5 mLs by mouth every 6 (six) hours as needed for indigestion.    Dispense:  355 mL    Refill:  0   ondansetron (ZOFRAN) 4 MG tablet    Sig: Take 1 tablet (4 mg total) by mouth every 4 (four) hours as needed for nausea or vomiting.    Dispense:  8 tablet    Refill:  0   alum & mag hydroxide-simeth (MAALOX MAX) 400-400-40 MG/5ML suspension    Sig: Take 5 mLs by mouth every 6 (six)  hours as needed for indigestion.    Dispense:  355 mL    Refill:  0   azithromycin (ZITHROMAX) 250 MG tablet    Sig: Take 1 tablet (250 mg total) by mouth daily. Take first 2 tablets together, then 1 every day until finished.    Dispense:  6 tablet    Refill:  0   pantoprazole (PROTONIX) 20 MG tablet    Sig: Take 1 tablet (20 mg total) by mouth daily for 14 days.    Dispense:  14 tablet    Refill:  0   sucralfate (CARAFATE) 1 g tablet    Sig: Take 1 tablet (1 g total) by mouth with breakfast, with lunch, and with evening meal for 7 days.    Dispense:  21 tablet    Refill:  0    -I have reviewed the patients home medicines and have made adjustments as needed   Consultations Obtained: na   Cardiac Monitoring: Continuous pulse oximetry interpreted by myself, 100% on RA.    Social Determinants of Health:  Diagnosis or treatment significantly limited by social determinants of health: does not speak english, smoker Counseled patient for approximately 3 minutes regarding smoking cessation. Discussed risks of smoking and how they applied and affected their visit here today. Patient not ready to quit at this time, however will follow up with their primary doctor when they are.   CPT code: 04540: intermediate counseling for smoking cessation     Reevaluation: After  the interventions noted above, I reevaluated the patient and found that they have improved  Co morbidities that complicate the patient evaluation  Past Medical History:  Diagnosis Date   Back pain    Bursitis    DJD (degenerative joint disease)    GERD (gastroesophageal reflux disease)    Hypertension    SBO (small bowel obstruction) (HCC) 08/17/2012   Scoliosis    Type 2 diabetes mellitus (HCC) 10/17/2015      Dispostion: Disposition decision including need for hospitalization was considered, and patient discharged from emergency department.    Final Clinical Impression(s) / ED Diagnoses Final diagnoses:  Nausea vomiting and diarrhea  Globus sensation  Atypical pneumonia        Sloan Leiter, DO 10/15/23 9811

## 2023-12-12 ENCOUNTER — Ambulatory Visit: Payer: Medicare Other | Admitting: Gastroenterology

## 2024-04-17 ENCOUNTER — Encounter (HOSPITAL_COMMUNITY): Payer: Self-pay | Admitting: Emergency Medicine

## 2024-04-17 ENCOUNTER — Emergency Department (HOSPITAL_COMMUNITY)

## 2024-04-17 ENCOUNTER — Emergency Department (HOSPITAL_COMMUNITY)
Admission: EM | Admit: 2024-04-17 | Discharge: 2024-04-17 | Disposition: A | Discharge status: Home / Self Care | Attending: Emergency Medicine | Admitting: Emergency Medicine

## 2024-04-17 ENCOUNTER — Other Ambulatory Visit: Payer: Self-pay

## 2024-04-17 DIAGNOSIS — Z79899 Other long term (current) drug therapy: Secondary | ICD-10-CM | POA: Insufficient documentation

## 2024-04-17 DIAGNOSIS — E860 Dehydration: Secondary | ICD-10-CM | POA: Insufficient documentation

## 2024-04-17 DIAGNOSIS — I10 Essential (primary) hypertension: Secondary | ICD-10-CM | POA: Diagnosis not present

## 2024-04-17 DIAGNOSIS — Z7984 Long term (current) use of oral hypoglycemic drugs: Secondary | ICD-10-CM | POA: Insufficient documentation

## 2024-04-17 DIAGNOSIS — E119 Type 2 diabetes mellitus without complications: Secondary | ICD-10-CM | POA: Diagnosis not present

## 2024-04-17 DIAGNOSIS — R42 Dizziness and giddiness: Secondary | ICD-10-CM | POA: Diagnosis present

## 2024-04-17 LAB — CBC
HCT: 43.1 % (ref 39.0–52.0)
Hemoglobin: 13.8 g/dL (ref 13.0–17.0)
MCH: 27.8 pg (ref 26.0–34.0)
MCHC: 32 g/dL (ref 30.0–36.0)
MCV: 86.9 fL (ref 80.0–100.0)
Platelets: 299 K/uL (ref 150–400)
RBC: 4.96 MIL/uL (ref 4.22–5.81)
RDW: 14 % (ref 11.5–15.5)
WBC: 6.9 K/uL (ref 4.0–10.5)
nRBC: 0 % (ref 0.0–0.2)

## 2024-04-17 LAB — COMPREHENSIVE METABOLIC PANEL WITH GFR
ALT: 20 U/L (ref 0–44)
AST: 30 U/L (ref 15–41)
Albumin: 3.9 g/dL (ref 3.5–5.0)
Alkaline Phosphatase: 71 U/L (ref 38–126)
Anion gap: 11 (ref 5–15)
BUN: 5 mg/dL — ABNORMAL LOW (ref 8–23)
CO2: 27 mmol/L (ref 22–32)
Calcium: 9.3 mg/dL (ref 8.9–10.3)
Chloride: 99 mmol/L (ref 98–111)
Creatinine, Ser: 0.91 mg/dL (ref 0.61–1.24)
GFR, Estimated: >60 mL/min (ref >60)
Glucose, Bld: 110 mg/dL — ABNORMAL HIGH (ref 70–99)
Potassium: 3.7 mmol/L (ref 3.5–5.1)
Sodium: 137 mmol/L (ref 135–145)
Total Bilirubin: 0.9 mg/dL (ref 0.0–1.2)
Total Protein: 7.7 g/dL (ref 6.5–8.1)

## 2024-04-17 LAB — URINALYSIS, ROUTINE W REFLEX MICROSCOPIC
Bacteria, UA: NONE SEEN
Bilirubin Urine: NEGATIVE
Glucose, UA: NEGATIVE mg/dL
Hgb urine dipstick: NEGATIVE
Ketones, ur: NEGATIVE mg/dL
Leukocytes,Ua: NEGATIVE
Nitrite: NEGATIVE
Protein, ur: NEGATIVE mg/dL
Specific Gravity, Urine: 1.004 — ABNORMAL LOW (ref 1.005–1.030)
pH: 7 (ref 5.0–8.0)

## 2024-04-17 LAB — TROPONIN I (HIGH SENSITIVITY): Troponin I (High Sensitivity): 6 ng/L (ref <18)

## 2024-04-17 LAB — LIPASE, BLOOD: Lipase: 45 U/L (ref 11–51)

## 2024-04-17 MED ORDER — HYDRALAZINE HCL 20 MG/ML IJ SOLN
10.0000 mg | Freq: Once | INTRAMUSCULAR | Status: AC
Start: 2024-04-17 — End: 2024-04-17
  Administered 2024-04-17: 10 mg via INTRAVENOUS
  Filled 2024-04-17: qty 1

## 2024-04-17 MED ORDER — MECLIZINE HCL 25 MG PO TABS
25.0000 mg | ORAL_TABLET | Freq: Once | ORAL | Status: AC
  Administered 2024-04-17: 25 mg via ORAL
  Filled 2024-04-17: qty 1

## 2024-04-17 MED ORDER — SODIUM CHLORIDE 0.9 % IV BOLUS
1000.0000 mL | Freq: Once | INTRAVENOUS | Status: AC
Start: 2024-04-17 — End: 2024-04-17
  Administered 2024-04-17: 1000 mL via INTRAVENOUS

## 2024-04-17 MED ORDER — MECLIZINE HCL 25 MG PO TABS: 25.0000 mg | ORAL_TABLET | Freq: Three times a day (TID) | ORAL | 0 refills | Status: AC | PRN

## 2024-04-17 MED ORDER — ONDANSETRON HCL 4 MG/2ML IJ SOLN
4.0000 mg | Freq: Once | INTRAMUSCULAR | Status: AC
  Administered 2024-04-17: 4 mg via INTRAVENOUS
  Filled 2024-04-17: qty 2

## 2024-04-17 NOTE — ED Provider Notes (Cosign Needed Addendum)
 [place] EMERGENCY DEPARTMENT AT [facility] Provider Note   CSN: [id number] Arrival date & time: 04/17/24  1825     Patient presents with: Emesis and Dizziness   Francisco [name] is a 78 y.o. male patient with history of type 2 diabetes, hypertension, reflux who presents to the emergency department today for further evaluation of room spinning dizziness and nausea and vomiting.  Dizziness and blurred vision started last night.  Patient states that he had another episode about a week ago with that was less severe and resolved spontaneously.  Patient describes room spinning vertigo all day today with some subsequent nausea and vomiting.  Patient has not been eating and drinking today secondary to symptoms.  He denies any diarrhea, abdominal pain, focal weakness or numbness.  He does feel generally weak however.  Patient has not missed any doses of his antihypertensives.    Emesis Dizziness Associated symptoms: vomiting        Prior to Admission medications   Medication Sig Start Date End Date Taking? Authorizing Provider  meclizine  (ANTIVERT ) 25 MG tablet Take 1 tablet (25 mg total) by mouth 3 (three) times daily as needed for dizziness. 04/17/24  Yes Theotis, Collie Wernick M, PA-C  alum & mag hydroxide-simeth (MAALOX MAX) 400-400-40 MG/5ML suspension Take 5 mLs by mouth every 6 (six) hours as needed for indigestion. 10/15/23   Elnor Jayson LABOR, DO  azithromycin  (ZITHROMAX ) 250 MG tablet Take 1 tablet (250 mg total) by mouth daily. Take first 2 tablets together, then 1 every day until finished. 10/15/23   Elnor Jayson LABOR, DO  lisinopril  (PRINIVIL ,ZESTRIL ) 10 MG tablet Take 1 tablet (10 mg total) by mouth daily. Patient not taking: Reported on 11/23/2016 11/06/15   Joshua Maryruth ORN, MD  metFORMIN (GLUCOPHAGE) 500 MG tablet Take 500 mg by mouth 2 (two) times daily with a meal.    [provider]  ondansetron  (ZOFRAN ) 4 MG tablet Take 1 tablet (4 mg total) by mouth every 4 (four) hours as needed  for nausea or vomiting. 10/15/23   Elnor Jayson LABOR, DO  pantoprazole  (PROTONIX ) 20 MG tablet Take 1 tablet (20 mg total) by mouth daily for 14 days. 10/15/23 10/29/23  Elnor Jayson LABOR, DO  sucralfate  (CARAFATE ) 1 g tablet Take 1 tablet (1 g total) by mouth with breakfast, with lunch, and with evening meal for 7 days. 10/15/23 10/22/23  Elnor Jayson LABOR, DO    Allergies: Patient has no known allergies.    Review of Systems  Gastrointestinal:  Positive for vomiting.  Neurological:  Positive for dizziness.  All other systems reviewed and are negative.   Updated Vital Signs BP (!) 195/91   Pulse 65   Temp 98.6 F (37 C) (Oral)   Resp 18   Wt 69.9 kg   SpO2 99%   BMI 27.30 kg/m   Physical Exam Vitals and nursing note reviewed.  Constitutional:      General: He is not in acute distress.    Appearance: Normal appearance.  HENT:     Head: Normocephalic and atraumatic.  Eyes:     General:        Right eye: No discharge.        Left eye: No discharge.  Cardiovascular:     Comments: Regular rate and rhythm.  S1/S2 are distinct without any evidence of murmur, rubs, or gallops.  Radial pulses are 2+ bilaterally.  Dorsalis pedis pulses are 2+ bilaterally.  No evidence of pedal edema. Pulmonary:  Comments: Clear to auscultation bilaterally.  Normal effort.  No respiratory distress.  No evidence of wheezes, rales, or rhonchi heard throughout. Abdominal:     General: Abdomen is flat. Bowel sounds are normal. There is no distension.     Tenderness: There is no abdominal tenderness. There is no guarding or rebound.  Musculoskeletal:        General: Normal range of motion.     Cervical back: Neck supple.  Skin:    General: Skin is warm and dry.     Findings: No rash.  Neurological:     General: No focal deficit present.     Mental Status: He is alert.     Comments: Cranial nerves II to XII are intact.  5/5 strength of the upper and lower extremities.  Normal sensation to the upper and lower  extremities.  Speech is normal.  Extraocular movements are intact without any significant nystagmus.  Psychiatric:        Mood and Affect: Mood normal.        Behavior: Behavior normal.     (all labs ordered are listed, but only abnormal results are displayed) Labs Reviewed  COMPREHENSIVE METABOLIC PANEL WITH GFR - Abnormal; Notable for the following components:      Result Value   Glucose, Bld 110 (*)    BUN 5 (*)    All other components within normal limits  URINALYSIS, ROUTINE W REFLEX MICROSCOPIC - Abnormal; Notable for the following components:   Color, Urine STRAW (*)    Specific Gravity, Urine 1.004 (*)    All other components within normal limits  LIPASE, BLOOD  CBC  TROPONIN I (HIGH SENSITIVITY)    EKG: EKG Interpretation Date/Time:  Wednesday April 17 2024 19:11:15 EDT Ventricular Rate:  72 PR Interval:  180 QRS Duration:  98 QT Interval:  406 QTC Calculation: 445 R Axis:   64  Text Interpretation: Sinus rhythm Confirmed by Elnor Savant ([id number]) on 04/17/2024 7:14:17 PM  Radiology: DG Chest 2 View Result Date: 04/17/2024 CLINICAL DATA:  Hypertensive urgency EXAM: CHEST - 2 VIEW COMPARISON:  01/04/2020 FINDINGS: Low lung volumes. Mild cardiomegaly. Subsegmental atelectasis or scarring at the bases. No edema, pleural effusion or pneumothorax. Chronic wedging at the thoracolumbar junction IMPRESSION: Low lung volumes with subsegmental atelectasis or scarring at the bases. Mild cardiomegaly. Electronically Signed   By: Luke Bun M.D.   On: 04/17/2024 20:35   CT Head Wo Contrast Result Date: 04/17/2024 CLINICAL DATA:  Central vertigo EXAM: CT HEAD WITHOUT CONTRAST TECHNIQUE: Contiguous axial images were obtained from the base of the skull through the vertex without intravenous contrast. RADIATION DOSE REDUCTION: This exam was performed according to the departmental dose-optimization program which includes automated exposure control, adjustment of the mA and/or kV according  to patient size and/or use of iterative reconstruction technique. COMPARISON:  CT head October 15, 2023 FINDINGS: Brain: No intracranial hemorrhage, mass effect, or evidence of acute infarct. No hydrocephalus. No extra-axial fluid collection. Age-commensurate cerebral atrophy and chronic small vessel ischemic disease. Vascular: No hyperdense vessel. Intracranial arterial calcification. Skull: No fracture or focal lesion. Sinuses/Orbits: Chronic right maxillary, ethmoid, and sphenoid sinusitis. No mastoid effusion Other: None. IMPRESSION: 1. No acute intracranial abnormality. 2. Chronic right maxillary, ethmoid, and sphenoid sinusitis. Electronically Signed   By: Norman Gatlin M.D.   On: 04/17/2024 20:29     Procedures   Medications Ordered in the ED  meclizine  (ANTIVERT ) tablet 25 mg (25 mg Oral Given 04/17/24 1933)  sodium  chloride 0.9 % bolus 1,000 mL (1,000 mLs Intravenous New Bag/Given 04/17/24 1934)  ondansetron  (ZOFRAN ) injection 4 mg (4 mg Intravenous Given 04/17/24 1933)  hydrALAZINE  (APRESOLINE ) injection 10 mg (10 mg Intravenous Given 04/17/24 2034)    Clinical Course as of 04/17/24 2130  Wed Apr 17, 2024  2104 On reassessment, patient is feeling much better after meclizine  and fluids.  He states his dizziness has significantly improved.  Blood pressure came down.  Most recent reading is 154/90. [CF]  2129 CBC Negative. [CF]  2129 Comprehensive metabolic panel(!) Negative. [CF]  2129 Lipase, blood Negative. [CF]  2130 Troponin I (High Sensitivity) Initial and delta troponin are normal. [CF]  2130 Urinalysis, Routine w reflex microscopic -Urine, Clean Catch(!) Negative for any infection. [CF]  2130 CT Head Wo Contrast No evidence of hemorrhagic stroke.  I do agree with the radiologist interpretation. [CF]  2130 DG Chest 2 View No significant abnormalities.  I do agree with the radiologist interpretation. [CF]    Clinical Course User Index [CF] Theotis Cameron HERO, PA-C    Medical  Decision Making Yuri Fana is a 78 y.o. male who presents to the emergency department today for further evaluation of dizziness. This patient presents with dizziness, most consistent with a peripheral cause, likely BPPV. No history of recent infection so doubt vestibular neuritis. History not consistent with meniere's disease. No history of trauma. No red flag features for central vertigo to include gradual onset, vertical/bidirectional or non-fatigable nystagmus, focal neurologic findings on exam (including inability to ambulate, ataxia, dysmetria). Presentation not consistent with an acute CNS infection, vertebral basilar artery insufficiency, cerebellar hemorrhage or infarction, intracranial mass or bleed.  Patient is improving. Blood pressure is better. In the room it is 154/90.  I suspect this was contributing to his dizziness.  Meclizine  also improved symptoms.  Will plan to discharge home with a prescription.  Strict return precautions were discussed.  He is safe for discharge at this time.   Amount and/or Complexity of Data Reviewed Labs: ordered. Radiology: ordered.  Risk Prescription drug management.     Final diagnoses:  Dizziness  Dehydration    ED Discharge Orders          Ordered    meclizine  (ANTIVERT ) 25 MG tablet  3 times daily PRN        04/17/24 2122               Theotis Cameron HERO, PA-C 04/17/24 2129    Theotis Cameron M, PA-C 04/17/24 2130    Elnor Jayson LABOR, DO 04/18/24 2335

## 2024-04-17 NOTE — Discharge Instructions (Addendum)
 Please drink plenty of fluids and get plenty of rest.  As we discussed, all your labs and imaging look great which is fantastic news.  I have also given you medication that you can take for your dizziness.  You may return to the emergency department for any worsening symptoms.

## 2024-04-17 NOTE — ED Triage Notes (Signed)
 Translator used #[id number]  Emesis, dizziness started this morning. Chest and throat burning pain.
